# Patient Record
Sex: Male | Born: 1995 | Race: White | Hispanic: No | Marital: Single | Smoking: Current every day smoker
Health system: Southern US, Community
[De-identification: ages and names within clinical notes are randomized; demographics above are authoritative.]

## PROBLEM LIST (undated history)

## (undated) DIAGNOSIS — L700 Acne vulgaris: Secondary | ICD-10-CM

## (undated) DIAGNOSIS — B1920 Unspecified viral hepatitis C without hepatic coma: Secondary | ICD-10-CM

## (undated) HISTORY — PX: ORIF FINGER / THUMB FRACTURE: SUR932

## (undated) HISTORY — DX: Unspecified viral hepatitis C without hepatic coma: B19.20

---
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
## 2013-01-15 ENCOUNTER — Emergency Department (HOSPITAL_COMMUNITY): Payer: Medicaid Other

## 2013-01-15 ENCOUNTER — Emergency Department (HOSPITAL_COMMUNITY)
Admission: EM | Admit: 2013-01-15 | Discharge: 2013-01-15 | Disposition: A | Discharge status: Home / Self Care | Payer: Medicaid Other | Attending: Emergency Medicine | Admitting: Emergency Medicine

## 2013-01-15 ENCOUNTER — Encounter (HOSPITAL_COMMUNITY): Payer: Self-pay | Admitting: Emergency Medicine

## 2013-01-15 DIAGNOSIS — R1013 Epigastric pain: Secondary | ICD-10-CM | POA: Insufficient documentation

## 2013-01-15 DIAGNOSIS — Z79899 Other long term (current) drug therapy: Secondary | ICD-10-CM | POA: Insufficient documentation

## 2013-01-15 DIAGNOSIS — F17200 Nicotine dependence, unspecified, uncomplicated: Secondary | ICD-10-CM | POA: Insufficient documentation

## 2013-01-15 DIAGNOSIS — R109 Unspecified abdominal pain: Secondary | ICD-10-CM

## 2013-01-15 LAB — COMPREHENSIVE METABOLIC PANEL
ALT: 19 U/L (ref 0–53)
AST: 26 U/L (ref 0–37)
Alkaline Phosphatase: 76 U/L (ref 39–117)
GFR calc Af Amer: >90 mL/min (ref >90)
Glucose, Bld: 111 mg/dL — ABNORMAL HIGH (ref 70–99)
Potassium: 3.7 mEq/L (ref 3.5–5.1)
Sodium: 137 mEq/L (ref 135–145)
Total Protein: 7.6 g/dL (ref 6.0–8.3)

## 2013-01-15 LAB — CBC WITH DIFFERENTIAL/PLATELET
Basophils Absolute: 0 10*3/uL (ref 0.0–0.1)
Eosinophils Absolute: 0 10*3/uL (ref 0.0–0.7)
Lymphocytes Relative: 23 % (ref 12–46)
Lymphs Abs: 1.9 10*3/uL (ref 0.7–4.0)
MCH: 30.9 pg (ref 26.0–34.0)
Neutrophils Relative %: 68 % (ref 43–77)
Platelets: 210 10*3/uL (ref 150–400)
RBC: 4.98 MIL/uL (ref 4.22–5.81)
RDW: 12.6 % (ref 11.5–15.5)
WBC: 8.4 10*3/uL (ref 4.0–10.5)

## 2013-01-15 LAB — URINALYSIS, ROUTINE W REFLEX MICROSCOPIC
Bilirubin Urine: NEGATIVE
Glucose, UA: NEGATIVE mg/dL
Hgb urine dipstick: NEGATIVE
Protein, ur: NEGATIVE mg/dL
Urobilinogen, UA: 0.2 mg/dL (ref 0.0–1.0)

## 2013-01-15 MED ORDER — IOHEXOL 300 MG/ML  SOLN
50.0000 mL | Freq: Once | INTRAMUSCULAR | Status: AC | PRN
  Administered 2013-01-15: 50 mL via ORAL

## 2013-01-15 MED ORDER — IOHEXOL 300 MG/ML  SOLN: 100.0000 mL | Freq: Once | INTRAMUSCULAR | Status: DC | PRN

## 2013-01-15 MED ORDER — HYDROMORPHONE HCL PF 1 MG/ML IJ SOLN
1.0000 mg | Freq: Once | INTRAMUSCULAR | Status: AC
  Administered 2013-01-15: 1 mg via INTRAVENOUS
  Filled 2013-01-15: qty 1

## 2013-01-15 MED ORDER — OXYCODONE-ACETAMINOPHEN 5-325 MG PO TABS: 1.0000 | ORAL_TABLET | Freq: Four times a day (QID) | ORAL | Status: DC | PRN

## 2013-01-15 MED ORDER — HYDROMORPHONE HCL PF 1 MG/ML IJ SOLN
INTRAMUSCULAR | Status: AC
  Administered 2013-01-15: 1 mg
  Filled 2013-01-15: qty 1

## 2013-01-15 MED ORDER — PANTOPRAZOLE SODIUM 40 MG IV SOLR
40.0000 mg | Freq: Once | INTRAVENOUS | Status: AC
Start: 2013-01-15 — End: 2013-01-15
  Administered 2013-01-15: 40 mg via INTRAVENOUS
  Filled 2013-01-15: qty 40

## 2013-01-15 MED ORDER — PANTOPRAZOLE SODIUM 20 MG PO TBEC: 20.0000 mg | DELAYED_RELEASE_TABLET | Freq: Every day | ORAL | Status: DC

## 2013-01-15 MED ORDER — ONDANSETRON HCL 4 MG/2ML IJ SOLN
4.0000 mg | Freq: Once | INTRAMUSCULAR | Status: AC
  Administered 2013-01-15: 4 mg via INTRAVENOUS
  Filled 2013-01-15: qty 2

## 2013-01-15 NOTE — ED Notes (Signed)
Pt c/o abd pain x one hour. Pt denies n/v/d.

## 2013-01-15 NOTE — ED Provider Notes (Signed)
History     CSN: [id number]  Arrival date & time 01/15/13  7829   First MD Initiated Contact with Patient 01/15/13 1907      Chief Complaint  Patient presents with  . Abdominal Pain    (Consider location/radiation/quality/duration/timing/severity/associated sxs/prior treatment) Patient is a 17 y.o. male presenting with abdominal pain. The history is provided by the patient (the pt complains of ruq and epigastric severe abd pain).  Abdominal Pain Pain location:  Epigastric Pain quality: sharp   Pain radiates to:  Does not radiate Pain severity:  Severe Onset quality:  Sudden Timing:  Constant Progression:  Waxing and waning Chronicity:  New Context: not alcohol use   Associated symptoms: no chest pain, no cough, no diarrhea, no fatigue and no hematuria     History reviewed. No pertinent past medical history.  History reviewed. No pertinent past surgical history.  History reviewed. No pertinent family history.  History  Substance Use Topics  . Smoking status: Current Every Day Smoker    Types: Cigarettes  . Smokeless tobacco: Not on file  . Alcohol Use: No      Review of Systems  Constitutional: Negative for fatigue.  HENT: Negative for congestion, sinus pressure and ear discharge.   Eyes: Negative for discharge.  Respiratory: Negative for cough.   Cardiovascular: Negative for chest pain.  Gastrointestinal: Positive for abdominal pain. Negative for diarrhea.  Genitourinary: Negative for frequency and hematuria.  Musculoskeletal: Negative for back pain.  Skin: Negative for rash.  Neurological: Negative for seizures and headaches.  Psychiatric/Behavioral: Negative for hallucinations.    Allergies  Review of patient's allergies indicates no known allergies.  Home Medications   Current Outpatient Rx  Name  Route  Sig  Dispense  Refill  . Biotin 1000 MCG tablet   Oral   Take 1,000 mcg by mouth daily.         Chilton Si Coffee Bean-Yerba Mate (GREEN COFFEE  BEAN EXTRACT PO)   Oral   Take 2 tablets by mouth 2 (two) times daily.         Marland Kitchen L-Arginine 1000 MG TABS   Oral   Take 1 tablet by mouth 3 (three) times daily.         . L-Glutamine 500 MG TABS   Oral   Take 1,000 mg by mouth 3 (three) times daily.         . Multiple Vitamin (MULTIVITAMIN WITH MINERALS) TABS   Oral   Take 1 tablet by mouth daily.         Marland Kitchen sulfamethoxazole-trimethoprim (BACTRIM DS) 800-160 MG per tablet   Oral   Take 1 tablet by mouth 2 (two) times daily.         Marland Kitchen oxyCODONE-acetaminophen (PERCOCET/ROXICET) 5-325 MG per tablet   Oral   Take 1 tablet by mouth every 6 (six) hours as needed for pain.   20 tablet   0   . pantoprazole (PROTONIX) 20 MG tablet   Oral   Take 1 tablet (20 mg total) by mouth daily.   20 tablet   0     BP 112/45  Pulse 56  Temp(Src) 98 F (36.7 C) (Oral)  Resp 20  Ht 6\' 3"  (1.905 m)  Wt 195 lb (88.451 kg)  BMI 24.37 kg/m2  SpO2 98%  Physical Exam  Constitutional: He is oriented to person, place, and time. He appears well-developed.  HENT:  Head: Normocephalic and atraumatic.  Eyes: Conjunctivae and EOM are normal. No scleral icterus.  Neck: Neck supple. No thyromegaly present.  Cardiovascular: Normal rate and regular rhythm.  Exam reveals no gallop and no friction rub.   No murmur heard. Pulmonary/Chest: No stridor. He has no wheezes. He has no rales. He exhibits no tenderness.  Abdominal: He exhibits no distension. There is tenderness. There is no rebound.  Tender epigastric and luq no rebound  Musculoskeletal: Normal range of motion. He exhibits no edema.  Lymphadenopathy:    He has no cervical adenopathy.  Neurological: He is oriented to person, place, and time. Coordination normal.  Skin: No rash noted. No erythema.  Psychiatric: He has a normal mood and affect. His behavior is normal.    ED Course  Procedures (including critical care time)  Labs Reviewed  CBC WITH DIFFERENTIAL - Abnormal; Notable  for the following:    MCHC 36.2 (*)    All other components within normal limits  COMPREHENSIVE METABOLIC PANEL - Abnormal; Notable for the following:    Glucose, Bld 111 (*)    GFR calc non Af Amer 85 (*)    All other components within normal limits  URINALYSIS, ROUTINE W REFLEX MICROSCOPIC - Abnormal; Notable for the following:    Specific Gravity, Urine >1.030 (*)    Ketones, ur TRACE (*)    All other components within normal limits  LIPASE, BLOOD   Ct Abdomen Pelvis W Contrast  01/15/2013  *RADIOLOGY REPORT*  Clinical Data: Right upper abdominal pain.  CT ABDOMEN AND PELVIS WITH CONTRAST  Technique:  Multidetector CT imaging of the abdomen and pelvis was performed following the standard protocol during bolus administration of intravenous contrast.  Contrast: 50mL OMNIPAQUE IOHEXOL 300 MG/ML  SOLN  Comparison: None.  Findings: Visualized lung bases clear.  Nonspecific 1 cm low attenuation lesion in hepatic segment 7. Unremarkable spleen, adrenal glands, pancreas, kidneys, nondilated gallbladder, abdominal aorta, portal vein.  Stomach is physiologically distended.  The small bowel and colon are nondilated.  Appendix not discretely identified.  Urinary bladder physiologically distended. No ascites.  No free air.  No nephrolithiasis or hydronephrosis. Lumbar spine unremarkable.  IMPRESSION:  1.  No acute abdominal process. 2.  Nonspecific 1 cm liver lesion, statistically most likely a cyst in the absence of a history of primary carcinoma peri   Original Report Authenticated By: D. Andria Rhein, MD      1. Abdominal pain       MDM  abd pain possibly pud.  Pt to follow up in two day        Benny Lennert, MD 01/15/13 2202

## 2014-08-04 ENCOUNTER — Encounter (HOSPITAL_COMMUNITY): Payer: Self-pay | Admitting: Emergency Medicine

## 2014-08-04 ENCOUNTER — Emergency Department (HOSPITAL_COMMUNITY)
Admission: EM | Admit: 2014-08-04 | Discharge: 2014-08-04 | Disposition: A | Discharge status: Other Acute Inpatient Hospital | Payer: Self-pay | Attending: Emergency Medicine | Admitting: Emergency Medicine

## 2014-08-04 ENCOUNTER — Inpatient Hospital Stay (HOSPITAL_COMMUNITY)
Admission: AD | Admit: 2014-08-04 | Discharge: 2014-08-07 | DRG: 885 | Disposition: A | Discharge status: Home / Self Care | Payer: 59 | Source: Intra-hospital | Attending: Psychiatry | Admitting: Psychiatry

## 2014-08-04 ENCOUNTER — Encounter (HOSPITAL_COMMUNITY): Payer: Self-pay | Admitting: Behavioral Health

## 2014-08-04 DIAGNOSIS — Z609 Problem related to social environment, unspecified: Secondary | ICD-10-CM

## 2014-08-04 DIAGNOSIS — F909 Attention-deficit hyperactivity disorder, unspecified type: Secondary | ICD-10-CM | POA: Diagnosis present

## 2014-08-04 DIAGNOSIS — F411 Generalized anxiety disorder: Secondary | ICD-10-CM | POA: Diagnosis present

## 2014-08-04 DIAGNOSIS — F322 Major depressive disorder, single episode, severe without psychotic features: Secondary | ICD-10-CM

## 2014-08-04 DIAGNOSIS — F39 Unspecified mood [affective] disorder: Secondary | ICD-10-CM | POA: Diagnosis present

## 2014-08-04 DIAGNOSIS — F1910 Other psychoactive substance abuse, uncomplicated: Secondary | ICD-10-CM

## 2014-08-04 DIAGNOSIS — R45851 Suicidal ideations: Secondary | ICD-10-CM

## 2014-08-04 DIAGNOSIS — F17200 Nicotine dependence, unspecified, uncomplicated: Secondary | ICD-10-CM | POA: Diagnosis present

## 2014-08-04 DIAGNOSIS — G4700 Insomnia, unspecified: Secondary | ICD-10-CM | POA: Diagnosis present

## 2014-08-04 DIAGNOSIS — F332 Major depressive disorder, recurrent severe without psychotic features: Secondary | ICD-10-CM | POA: Diagnosis present

## 2014-08-04 HISTORY — DX: Acne vulgaris: L70.0

## 2014-08-04 LAB — COMPREHENSIVE METABOLIC PANEL
ALBUMIN: 3.7 g/dL (ref 3.5–5.2)
ALT: 38 U/L (ref 0–53)
AST: 43 U/L — AB (ref 0–37)
Alkaline Phosphatase: 74 U/L (ref 39–117)
Anion gap: 9 (ref 5–15)
BUN: 16 mg/dL (ref 6–23)
CALCIUM: 8.9 mg/dL (ref 8.4–10.5)
CO2: 27 meq/L (ref 19–32)
Chloride: 102 mEq/L (ref 96–112)
Creatinine, Ser: 0.94 mg/dL (ref 0.50–1.35)
GFR calc Af Amer: >90 mL/min (ref >90)
Glucose, Bld: 77 mg/dL (ref 70–99)
Potassium: 3.8 mEq/L (ref 3.7–5.3)
SODIUM: 138 meq/L (ref 137–147)
TOTAL PROTEIN: 6.8 g/dL (ref 6.0–8.3)
Total Bilirubin: <0.2 mg/dL — ABNORMAL LOW (ref 0.3–1.2)

## 2014-08-04 LAB — RAPID URINE DRUG SCREEN, HOSP PERFORMED
AMPHETAMINES: NOT DETECTED
BENZODIAZEPINES: NOT DETECTED
Barbiturates: NOT DETECTED
COCAINE: NOT DETECTED
OPIATES: NOT DETECTED
Tetrahydrocannabinol: NOT DETECTED

## 2014-08-04 LAB — ETHANOL: Alcohol, Ethyl (B): <11 mg/dL (ref 0–11)

## 2014-08-04 LAB — CBC
HCT: 39.4 % (ref 39.0–52.0)
Hemoglobin: 13.3 g/dL (ref 13.0–17.0)
MCH: 30.2 pg (ref 26.0–34.0)
MCHC: 33.8 g/dL (ref 30.0–36.0)
MCV: 89.5 fL (ref 78.0–100.0)
PLATELETS: 223 10*3/uL (ref 150–400)
RBC: 4.4 MIL/uL (ref 4.22–5.81)
RDW: 12.5 % (ref 11.5–15.5)
WBC: 8.2 10*3/uL (ref 4.0–10.5)

## 2014-08-04 LAB — ACETAMINOPHEN LEVEL: Acetaminophen (Tylenol), Serum: <15 ug/mL (ref 10–30)

## 2014-08-04 LAB — SALICYLATE LEVEL

## 2014-08-04 MED ORDER — ZOLPIDEM TARTRATE 5 MG PO TABS: 5.0000 mg | ORAL_TABLET | Freq: Every evening | ORAL | Status: DC | PRN

## 2014-08-04 MED ORDER — IBUPROFEN 200 MG PO TABS: 600.0000 mg | ORAL_TABLET | Freq: Three times a day (TID) | ORAL | Status: DC | PRN

## 2014-08-04 MED ORDER — ONDANSETRON HCL 4 MG PO TABS: 4.0000 mg | ORAL_TABLET | Freq: Three times a day (TID) | ORAL | Status: DC | PRN

## 2014-08-04 MED ORDER — LORAZEPAM 1 MG PO TABS: 1.0000 mg | ORAL_TABLET | Freq: Three times a day (TID) | ORAL | Status: DC | PRN

## 2014-08-04 MED ORDER — HYDROXYZINE HCL 25 MG PO TABS
25.0000 mg | ORAL_TABLET | ORAL | Status: DC | PRN
  Filled 2014-08-04: qty 42

## 2014-08-04 MED ORDER — NICOTINE 21 MG/24HR TD PT24
21.0000 mg | MEDICATED_PATCH | Freq: Every day | TRANSDERMAL | Status: DC
  Administered 2014-08-04 – 2014-08-07 (×4): 21 mg via TRANSDERMAL
  Filled 2014-08-04 (×6): qty 1

## 2014-08-04 MED ORDER — MAGNESIUM HYDROXIDE 400 MG/5ML PO SUSP: 30.0000 mL | Freq: Every day | ORAL | Status: DC | PRN

## 2014-08-04 MED ORDER — NICOTINE 21 MG/24HR TD PT24: 21.0000 mg | MEDICATED_PATCH | Freq: Every day | TRANSDERMAL | Status: DC | PRN

## 2014-08-04 MED ORDER — TRAZODONE HCL 100 MG PO TABS
50.0000 mg | ORAL_TABLET | Freq: Every day | ORAL | Status: DC
  Administered 2014-08-05 – 2014-08-06 (×2): 50 mg via ORAL
  Filled 2014-08-04 (×6): qty 1

## 2014-08-04 MED ORDER — ALUM & MAG HYDROXIDE-SIMETH 200-200-20 MG/5ML PO SUSP: 30.0000 mL | ORAL | Status: DC | PRN

## 2014-08-04 MED ORDER — ACETAMINOPHEN 325 MG PO TABS: 650.0000 mg | ORAL_TABLET | ORAL | Status: DC | PRN

## 2014-08-04 NOTE — ED Notes (Signed)
Per patients mother, he just got off the bus from [place], he's been there with his girlfriend doing drugs and now he states that he's suicidal. Pt doesn't want to be here but mom has convinced him that [facility] is a great place to be.

## 2014-08-04 NOTE — ED Notes (Signed)
Bed: [facility] Expected date:  Expected time:  Means of arrival:  Comments: 

## 2014-08-04 NOTE — ED Notes (Addendum)
Pt has wallet, cellphone, clothing, and shoes.   Pt has been seen by security and wanded.

## 2014-08-04 NOTE — ED Provider Notes (Signed)
CSN: [id number]     Arrival date & time 08/04/14  0508 History   First MD Initiated Contact with Patient 08/04/14 [phone]     Chief Complaint  Patient presents with  . Suicidal     (Consider location/radiation/quality/duration/timing/severity/associated sxs/prior Treatment) HPI Comments: 58M presents with suicidal ideation. Hx of suicide attempt by cutting wrists. Recently moved to [place] with girlfriend, was smoking pot daily and drinking a few shots every other day. 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 Became more suicidal, patient had plan to slit his wrists and walk into the ocean. Mother convinced him to come home to [place] and to come to behavioral health. Still suicidal now.    Patient is a 18 y.o. male presenting with mental health disorder. The history is provided by the patient.  Mental Health Problem Presenting symptoms: depression, suicidal thoughts and suicidal threats   Presenting symptoms: no bizarre behavior, no disorganized speech, no disorganized thought process, no paranoid behavior, no self mutilation and no suicide attempt   Degree of incapacity (severity):  Moderate Onset quality:  Gradual Timing:  Constant Progression:  Worsening Chronicity:  Recurrent Context: drug abuse, noncompliance and stressful life event (broke up with girlfriend)   Context: not alcohol use, not medication and not recent medication change   Treatment compliance:  Untreated Relieved by:  Nothing Worsened by:  Nothing tried Associated symptoms: feelings of worthlessness   Associated symptoms: no abdominal pain, no anhedonia, no chest pain, no decreased need for sleep, no hypersomnia, no hyperventilation, no poor judgment, no psychomotor retardation and no school problems     History reviewed. No pertinent past medical history. History reviewed. No pertinent past surgical history. History reviewed. No pertinent family history. History  Substance Use Topics  . Smoking status:  Current Every Day Smoker    Types: Cigarettes  . Smokeless tobacco: Not on file  . Alcohol Use: No    Review of Systems  Constitutional: Negative for fever and chills.  Cardiovascular: Negative for chest pain.  Gastrointestinal: Negative for nausea, vomiting, abdominal pain and diarrhea.  Psychiatric/Behavioral: Positive for suicidal ideas. Negative for self-injury and paranoia.  All other systems reviewed and are negative.     Allergies  Review of patient's allergies indicates no known allergies.  Home Medications   Prior to Admission medications   Medication Sig Start Date End Date Taking? Authorizing Provider  sulfamethoxazole-trimethoprim (BACTRIM DS) 800-160 MG per tablet Take 1 tablet by mouth 2 (two) times daily.   Yes Historical Provider, MD   BP 130/87  Pulse 50  Temp(Src) 97.5 F (36.4 C) (Oral)  Resp 20  SpO2 100% Physical Exam  Nursing note and vitals reviewed. Constitutional: He is oriented to person, place, and time. He appears well-developed and well-nourished. No distress.  HENT:  Head: Normocephalic and atraumatic.  Mouth/Throat: Oropharynx is clear and moist. No oropharyngeal exudate.  Eyes: EOM are normal. Pupils are equal, round, and reactive to light.  Neck: Normal range of motion. Neck supple.  Cardiovascular: Normal rate and regular rhythm.  Exam reveals no friction rub.   No murmur heard. Pulmonary/Chest: Effort normal and breath sounds normal. No respiratory distress. He has no wheezes. He has no rales.  Abdominal: Soft. He exhibits no distension. There is no tenderness. There is no rebound.  Musculoskeletal: Normal range of motion. He exhibits no edema.  Neurological: He is alert and oriented to person, place, and time.  Skin: No rash noted. He is not diaphoretic.    ED Course  Procedures (including critical care time) Labs Review Labs Reviewed  COMPREHENSIVE METABOLIC PANEL - Abnormal; Notable for the following:    AST 43 (*)    Total  Bilirubin <0.2 (*)    All other components within normal limits  SALICYLATE LEVEL - Abnormal; Notable for the following:    Salicylate Lvl <2.0 (*)    All other components within normal limits  ACETAMINOPHEN LEVEL  CBC  ETHANOL  URINE RAPID DRUG SCREEN (HOSP PERFORMED)    Imaging Review No results found.   EKG Interpretation None      MDM   Final diagnoses:  MDD (major depressive disorder), single episode, severe , no psychosis  Suicidal ideation    18M with recent stay in [place] using drugs and development of suicidality. Plan to slit his wrists and walk into the ocean while in [place]. Patient states continued plan to take a bunch of his mother's Klonopin and never wake up. Medically clear per labs, ready for TTS consult.  Psych admitting.    Elwin Mocha, MD 08/04/14 [phone]

## 2014-08-04 NOTE — Tx Team (Signed)
Initial Interdisciplinary Treatment Plan   PATIENT STRESSORS: Financial difficulties Loss of Girl Friend Marital or family conflict Occupational concerns Substance abuse   PROBLEM LIST: Problem List/Patient Goals Date to be addressed Date deferred Reason deferred Estimated date of resolution  Depression  08/04/2014   08/10/2014  Suicidal thoughts 08/04/2014   08/10/2014  Loss of Relationship 08/04/2014   08/10/2014  Substance Abuse 08/04/2014   08/10/2014                                 DISCHARGE CRITERIA:  Ability to meet basic life and health needs Improved stabilization in mood, thinking, and/or behavior Medical problems require only outpatient monitoring Motivation to continue treatment in a less acute level of care Need for constant or close observation no longer present Reduction of life-threatening or endangering symptoms to within safe limits Verbal commitment to aftercare and medication compliance  PRELIMINARY DISCHARGE PLAN: Attend 12-step recovery group Outpatient therapy Return to previous living arrangement  PATIENT/FAMIILY INVOLVEMENT: This treatment plan has been presented to and reviewed with the patient, John [name], and/or family member.  The patient and family have been given the opportunity to ask questions and make suggestions.  John [name] John [name] 08/04/2014, 3:51 PM

## 2014-08-04 NOTE — ED Notes (Signed)
Pt admits to be suicidal and depressed about his [place] trip not going well, he said he and his girlfriend ended up homeless and was doing drugs.

## 2014-08-04 NOTE — ED Notes (Addendum)
MD at bedside. TTS PRESENT TO SEE PT

## 2014-08-04 NOTE — ED Notes (Signed)
Patient has 2 patient belonging bags, and on long grey bag filled with clothing. Patients 3 bags are in locker 27 in [place].
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
## 2014-08-04 NOTE — Consult Note (Signed)
[facility] Consult   Reason for Consult:  Suicidal ideation Referring Physician:  EDP  John [name] is an 18 y.o. male. Total Time spent with patient: 30 minutes  Assessment: AXIS I:  Major Depression, Recurrent severe and Substance Abuse AXIS II:  Deferred AXIS III:  History reviewed. No pertinent past medical history. AXIS IV:  other psychosocial or environmental problems AXIS V:  11-20 some danger of hurting self or others possible OR occasionally fails to maintain minimal personal hygiene OR gross impairment in communication  Plan:  Recommend psychiatric Inpatient admission when medically cleared.  Subjective:    HPI:  John [name] is a 18 y.o. male patient.  Patient states that he is having worsening depression and suicidal thoughts wit plan to overdose.  Patient states that he does have a history of suicide attempt 4 years ago cutting his wrist.  Patient states that he has had one psych hospitalization also 4 years ago.  Patient lives with his mother.  Stressor for depression recently having to move back home from [place] when things didn't work out with girlfriend and states that he was homeless in [place] for couple weeks until got enough money to get back home.  Patient is unable to contract for safety. Patient denies homicidal ideation, psychosis, and paranoia.  Patient does state that he use recreation drugs "Weed, alcohol and pop pills."   HPI Elements:   Location:  Worsening depression. Quality:  suicidal ideation. Severity:  suicidal ideation. Timing:  1 week. Review of Systems  HENT: Negative.   Respiratory: Negative.   Musculoskeletal: Negative.   Skin: Negative for itching.       Facial acne   Psychiatric/Behavioral: Positive for depression, suicidal ideas and substance abuse. Negative for hallucinations and memory loss. The patient is not nervous/anxious and does not have insomnia.   All other systems reviewed and are negative. History reviewed.  No pertinent family history.   Past Psychiatric History: History reviewed. No pertinent past medical history.  reports that he has been smoking Cigarettes.  He has been smoking about 0.00 packs per day. He does not have any smokeless tobacco history on file. He reports that he does not drink alcohol or use illicit drugs. History reviewed. No pertinent family history.         Allergies:  No Known Allergies  ACT Assessment Complete:  Yes:    Educational Status    Risk to Self: Risk to self with the past 6 months Is patient at risk for suicide?: Yes Substance abuse history and/or treatment for substance abuse?: Yes  Risk to Others:    Abuse:    Prior Inpatient Therapy:    Prior Outpatient Therapy:    Additional Information:         Objective: Blood pressure 114/55, pulse 42, temperature 98.4 F (36.9 C), temperature source Oral, resp. rate 12, SpO2 99.00%.There is no weight on file to calculate BMI. Results for orders placed during the hospital encounter of 08/04/14 (from the past 72 hour(s))  URINE RAPID DRUG SCREEN (HOSP PERFORMED)     Status: None   Collection Time    08/04/14  5:38 AM      Result Value Ref Range   Opiates NONE DETECTED  NONE DETECTED   Cocaine NONE DETECTED  NONE DETECTED   Benzodiazepines NONE DETECTED  NONE DETECTED   Amphetamines NONE DETECTED  NONE DETECTED   Tetrahydrocannabinol NONE DETECTED  NONE DETECTED   Barbiturates NONE DETECTED  NONE DETECTED   Comment:  DRUG SCREEN FOR MEDICAL PURPOSES     ONLY.  IF CONFIRMATION IS NEEDED     FOR ANY PURPOSE, NOTIFY LAB     WITHIN 5 DAYS.                LOWEST DETECTABLE LIMITS     FOR URINE DRUG SCREEN     Drug Class       Cutoff (ng/mL)     Amphetamine      1000     Barbiturate      200     Benzodiazepine   165     Tricyclics       790     Opiates          300     Cocaine          300     THC              50  ACETAMINOPHEN LEVEL     Status: None   Collection Time    08/04/14  5:39  AM      Result Value Ref Range   Acetaminophen (Tylenol), Serum <15.0  10 - 30 ug/mL   Comment:            THERAPEUTIC CONCENTRATIONS VARY     SIGNIFICANTLY. A RANGE OF 10-30     ug/mL MAY BE AN EFFECTIVE     CONCENTRATION FOR MANY PATIENTS.     HOWEVER, SOME ARE BEST TREATED     AT CONCENTRATIONS OUTSIDE THIS     RANGE.     ACETAMINOPHEN CONCENTRATIONS     >150 ug/mL AT 4 HOURS AFTER     INGESTION AND >50 ug/mL AT 12     HOURS AFTER INGESTION ARE     OFTEN ASSOCIATED WITH TOXIC     REACTIONS.  CBC     Status: None   Collection Time    08/04/14  5:39 AM      Result Value Ref Range   WBC 8.2  4.0 - 10.5 K/uL   RBC 4.40  4.22 - 5.81 MIL/uL   Hemoglobin 13.3  13.0 - 17.0 g/dL   HCT 39.4  39.0 - 52.0 %   MCV 89.5  78.0 - 100.0 fL   MCH 30.2  26.0 - 34.0 pg   MCHC 33.8  30.0 - 36.0 g/dL   RDW 12.5  11.5 - 15.5 %   Platelets 223  150 - 400 K/uL  COMPREHENSIVE METABOLIC PANEL     Status: Abnormal   Collection Time    08/04/14  5:39 AM      Result Value Ref Range   Sodium 138  137 - 147 mEq/L   Potassium 3.8  3.7 - 5.3 mEq/L   Chloride 102  96 - 112 mEq/L   CO2 27  19 - 32 mEq/L   Glucose, Bld 77  70 - 99 mg/dL   BUN 16  6 - 23 mg/dL   Creatinine, Ser 0.94  0.50 - 1.35 mg/dL   Calcium 8.9  8.4 - 10.5 mg/dL   Total Protein 6.8  6.0 - 8.3 g/dL   Albumin 3.7  3.5 - 5.2 g/dL   AST 43 (*) 0 - 37 U/L   ALT 38  0 - 53 U/L   Alkaline Phosphatase 74  39 - 117 U/L   Total Bilirubin <0.2 (*) 0.3 - 1.2 mg/dL   GFR calc non Af Amer >90  >90 mL/min   GFR calc  Af Amer >90  >90 mL/min   Comment: (NOTE)     The eGFR has been calculated using the CKD EPI equation.     This calculation has not been validated in all clinical situations.     eGFR's persistently <90 mL/min signify possible Chronic Kidney     Disease.   Anion gap 9  5 - 15  ETHANOL     Status: None   Collection Time    08/04/14  5:39 AM      Result Value Ref Range   Alcohol, Ethyl (B) <11  0 - 11 mg/dL   Comment:             LOWEST DETECTABLE LIMIT FOR     SERUM ALCOHOL IS 11 mg/dL     FOR MEDICAL PURPOSES ONLY  SALICYLATE LEVEL     Status: Abnormal   Collection Time    08/04/14  5:39 AM      Result Value Ref Range   Salicylate Lvl <5.4 (*) 2.8 - 20.0 mg/dL   Labs are reviewed UDS positive opiates and THC.  Medications reviewed and no chang  Current Facility-Administered Medications  Medication Dose Route Frequency Provider Last Rate Last Dose  . acetaminophen (TYLENOL) tablet 650 mg  650 mg Oral Q4H PRN Evelina Bucy, MD      . alum & mag hydroxide-simeth (MAALOX/MYLANTA) 200-200-20 MG/5ML suspension 30 mL  30 mL Oral PRN Evelina Bucy, MD      . ibuprofen (ADVIL,MOTRIN) tablet 600 mg  600 mg Oral Q8H PRN Evelina Bucy, MD      . LORazepam (ATIVAN) tablet 1 mg  1 mg Oral Q8H PRN Evelina Bucy, MD      . nicotine (NICODERM CQ - dosed in mg/24 hours) patch 21 mg  21 mg Transdermal Daily PRN Evelina Bucy, MD      . ondansetron [facility]) tablet 4 mg  4 mg Oral Q8H PRN Evelina Bucy, MD      . zolpidem (AMBIEN) tablet 5 mg  5 mg Oral QHS PRN Evelina Bucy, MD       Current Outpatient Prescriptions  Medication Sig Dispense Refill  . sulfamethoxazole-trimethoprim (BACTRIM DS) 800-160 MG per tablet Take 1 tablet by mouth 2 (two) times daily.        Psychiatric Specialty Exam:     Blood pressure 114/55, pulse 42, temperature 98.4 F (36.9 C), temperature source Oral, resp. rate 12, SpO2 99.00%.There is no weight on file to calculate BMI.  General Appearance: Casual  Eye Contact::  Fair  Speech:  Clear and Coherent and Normal Rate  Volume:  Normal  Mood:  Depressed  Affect:  Depressed and Flat  Thought Process:  Circumstantial and Goal Directed  Orientation:  Full (Time, Place, and Person)  Thought Content:  "I''m suicidal"  Suicidal Thoughts:  Yes.  with intent/plan  Homicidal Thoughts:  No  Memory:  Immediate;   Good Recent;   Good Remote;   Good  Judgement:  Impaired  Insight:  Lacking  Psychomotor  Activity:  Decreased  Concentration:  Fair  Recall:  Good  Fund of Knowledge:Good  Language: Good  Akathisia:  No  Handed:  Right  AIMS (if indicated):     Assets:  Communication Skills Desire for Improvement Housing Social Support  Sleep:      Musculoskeletal: Strength & Muscle Tone: within normal limits Gait & Station: normal Patient leans: N/A  Treatment Plan Summary: Daily contact with patient to assess and evaluate symptoms and progress  in treatment Medication management Recommend inpatient treatment  Earleen Newport, FNP-BC 08/04/2014 12:10 PM

## 2014-08-04 NOTE — Consult Note (Signed)
Face to face evaluation and I agree with this note 

## 2014-08-04 NOTE — BH Assessment (Signed)
Patient accepted to to [facility] by Dr. Rodman Key and Assunta Found, NP. The room assignment # is 307-2. Nursing report # is [phone]. Support paperwork completed.

## 2014-08-05 DIAGNOSIS — F411 Generalized anxiety disorder: Secondary | ICD-10-CM

## 2014-08-05 DIAGNOSIS — R45851 Suicidal ideations: Secondary | ICD-10-CM

## 2014-08-05 DIAGNOSIS — F39 Unspecified mood [affective] disorder: Secondary | ICD-10-CM

## 2014-08-05 DIAGNOSIS — F1910 Other psychoactive substance abuse, uncomplicated: Secondary | ICD-10-CM | POA: Diagnosis present

## 2014-08-05 MED ORDER — LORAZEPAM 1 MG PO TABS
1.0000 mg | ORAL_TABLET | Freq: Four times a day (QID) | ORAL | Status: DC | PRN
  Administered 2014-08-05 – 2014-08-07 (×5): 1 mg via ORAL
  Filled 2014-08-05 (×5): qty 1

## 2014-08-05 MED ORDER — LAMOTRIGINE 25 MG PO TABS
25.0000 mg | ORAL_TABLET | Freq: Every day | ORAL | Status: DC
  Administered 2014-08-05 – 2014-08-07 (×3): 25 mg via ORAL
  Filled 2014-08-05 (×5): qty 1
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
## 2014-08-05 NOTE — [facility] Group Notes (Signed)
[facility] LCSW Group Therapy 08/05/2014  1:15 PM Type of Therapy: Group Therapy Participation Level: Active  Participation Quality: Attentive, Sharing and Supportive  Affect: Depressed and Flat  Cognitive: Alert and Oriented  Insight: Developing/Improving and Engaged  Engagement in Therapy: Developing/Improving and Engaged  Modes of Intervention: Clarification, Confrontation, Discussion, Education, Exploration, Limit-setting, Orientation, Problem-solving, Rapport Building, Dance movement psychotherapist, Socialization and Support  Summary of Progress/Problems: The topic for group today was emotional regulation. This group focused on both positive and negative emotion identification and allowed group members to process ways to identify feelings, regulate negative emotions, and find healthy ways to manage internal/external emotions. Group members were asked to reflect on a time when their reaction to an emotion led to a negative outcome and explored how alternative responses using emotion regulation would have benefited them. Group members were also asked to discuss a time when emotion regulation was utilized when a negative emotion was experienced. Patient reported that he did not wish to share on topic this afternoon. Patient was observed sleeping during part of the group but did provide support to another patient and discussed the negative effects of reacting before thinking when dealing with anger. CSW's and other group members provided emotional support and encouragement.   [name], MSW, [employer] Clinical Social Worker [facility] [phone]
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
## 2014-08-05 NOTE — Progress Notes (Signed)
Patient ID: John [name], male   DOB: 1996-03-13, 18 y.o.   MRN: [id number] D: Patient reported feeling anxious after a reported phone call with mother. Pt stated his mother is going through a lot but will not elaborate. Pt mood and affect appeared anxious and depressed. Pt denies SI/HI/AVH.  No acute distressed noted at this time.   A: Met with pt 1:1. Medications administered as prescribed. Writer encouraged pt to discuss feelings. Pt encouraged to come to staff with any question or concerns.   R: Patient remains safe. He is complaint with medications and denies any adverse reaction. Continue current POC.

## 2014-08-05 NOTE — Clinical Social Work Note (Signed)
CSW provided patient with medicaid application.  [name], MSW, [employer] Clinical Social Worker [facility] [phone]

## 2014-08-05 NOTE — Progress Notes (Signed)
Pt has been up and active in the milieu today.  He rated all his depression hopelessness and anxiety a 7 on his self-inventory.  He was started on lamictal 25 mg today and was given a prn ativan at 1251 for his anxiety which he reported relief.  He denied any S/H ideation or A/V/H. He plans to try to stay positive and hopes he learn some coping skills while here.

## 2014-08-05 NOTE — [facility] Group Notes (Signed)
   [facility] LCSW Aftercare Discharge Planning Group Note  08/05/2014  8:45 AM   Participation Quality: Alert, Appropriate and Oriented  Mood/Affect: Depressed and Flat  Depression Rating: 7  Anxiety Rating: 7  Thoughts of Suicide: Pt denies SI/HI  Will you contract for safety? Yes  Current AVH: Pt denies  Plan for Discharge/Comments: Pt attended discharge planning group and actively participated in group. CSW provided pt with today's workbook. Patient reports feeling "alright, tired" today. He reports that he plans to discharge home with his parents and is agreeable to follow up with [facility] for outpatient services.   Transportation Means: Pt reports access to transportation via family  Supports: Patient identified his parents and his grandmother as supports.  [name], MSW, [employer] Clinical Social Worker [facility] [phone]

## 2014-08-05 NOTE — Progress Notes (Signed)
D  Pt was in bed asleep since coming on to the unit    He did not attend group and did not get his sleeping medication   He does not appear to be in distress and his respirations are normal A   Verbal support given   Medications administered and effectiveness monitored    Q 15 min checks R   Pt safe at present
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
## 2014-08-05 NOTE — Progress Notes (Signed)
Adult Psychoeducational Group Note  Date:  08/05/2014 Time:  10:00 am  Group Topic/Focus:  Wellness Toolbox:   The focus of this group is to discuss various aspects of wellness, balancing those aspects and exploring ways to increase the ability to experience wellness.  Patients will create a wellness toolbox for use upon discharge.  Participation Level:  Did Not Attend   Modena Nunnery 08/05/2014, 12:55 PM

## 2014-08-06 ENCOUNTER — Ambulatory Visit (HOSPITAL_COMMUNITY)
Admit: 2014-08-06 | Discharge: 2014-08-06 | Disposition: A | Discharge status: Home / Self Care | Payer: 59 | Attending: Psychiatry | Admitting: Psychiatry

## 2014-08-06 DIAGNOSIS — F909 Attention-deficit hyperactivity disorder, unspecified type: Secondary | ICD-10-CM

## 2014-08-06 MED ORDER — NICOTINE POLACRILEX 2 MG MT GUM
CHEWING_GUM | OROMUCOSAL | Status: AC
  Administered 2014-08-06: 2 mg
  Filled 2014-08-06: qty 1

## 2014-08-06 NOTE — Clinical Social Work Note (Signed)
CSW attempted to contact patient's mother John [name] [phone]. No answer. CSW will attempt to provide SPE at a later time.  [name], MSW, [employer] Clinical Social Worker [facility] [phone]
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
## 2014-08-06 NOTE — Progress Notes (Signed)
Pt was in bed most of the morning and did get up for breakfast and 0800 medications and back to bed.  He rated his depression a 3 hopelessness a 2 and his anxiety a 4 on his self-inventory.  He denies any S/H ideation or A/V/H.  He did request prn ativan at 1123 before lunch.  His goal "creating a better discharge plan" by "talking with the social worker and family".  He did agitated talking on the phone with his grandmother.  Pt stated,"she has already rented my room out to someone who is not even a family member until May next year"  He did talk about the possibility of staying with his Aunt who by chance was here representing [employer] this morning.

## 2014-08-06 NOTE — Clinical Social Work Note (Signed)
Patient requesting information about medicaid and short-term disability. CSW contacted financial counselor Geri at [facility] regarding patient's questions.  [name], MSW, [employer] Clinical Social Worker [facility] [phone]

## 2014-08-06 NOTE — Progress Notes (Signed)
Patient did attend the evening karaoke group. Pt was engaged, supportive, and participated by singing a song.  

## 2014-08-06 NOTE — Progress Notes (Addendum)
Transportation arranged for patient to go for xrays as ordered.  Patient refused to go and states we don"t need to worry about it.  [place] notified and cancelled.  Dr. Dub Mikes was also notified that patient refused the xrays.

## 2014-08-06 NOTE — [facility] Group Notes (Signed)
[facility] LCSW Group Therapy 08/06/2014 1:15 PM Type of Therapy: Group Therapy Participation Level: Active  Participation Quality: Attentive, Sharing and Supportive  Affect: Depressed and Flat  Cognitive: Alert and Oriented  Insight: Developing/Improving and Engaged  Engagement in Therapy: Developing/Improving and Engaged  Modes of Intervention: Activity, Clarification, Confrontation, Discussion, Education, Exploration, Limit-setting, Orientation, Problem-solving, Rapport Building, Dance movement psychotherapist, Socialization and Support  Summary of Progress/Problems: Patient was attentive and engaged with speaker from [employer]. Patient was attentive to speaker while they shared their story of dealing with mental health and overcoming it. Patient expressed interest in their programs and services and received information on their agency. Patient processed ways they can relate to the speaker. Patient left during group and returned at the very end.  [name], MSW, [facility] Clinical Social Worker [facility] [phone]

## 2014-08-06 NOTE — [facility] Group Notes (Signed)
0900 nursing orientation group   The focus of this group is to educate the patient on the purpose and policies of crisis stabilization and provide a format to answer questions about their admission.  The group details unit policies and expectations of patients while admitted.   Pt did not attend he was in bed asleep.  

## 2014-08-07 DIAGNOSIS — F332 Major depressive disorder, recurrent severe without psychotic features: Principal | ICD-10-CM

## 2014-08-07 MED ORDER — LORAZEPAM 1 MG PO TABS: 1.0000 mg | ORAL_TABLET | Freq: Four times a day (QID) | ORAL | Status: DC | PRN

## 2014-08-07 MED ORDER — SULFAMETHOXAZOLE-TMP DS 800-160 MG PO TABS: 1.0000 | ORAL_TABLET | Freq: Two times a day (BID) | ORAL | Status: DC

## 2014-08-07 MED ORDER — TRAZODONE HCL 50 MG PO TABS: 50.0000 mg | ORAL_TABLET | Freq: Every day | ORAL | Status: DC

## 2014-08-07 MED ORDER — LAMOTRIGINE 25 MG PO TABS: 25.0000 mg | ORAL_TABLET | Freq: Every day | ORAL | Status: DC

## 2014-08-07 MED ORDER — SULFAMETHOXAZOLE-TMP DS 800-160 MG PO TABS
1.0000 | ORAL_TABLET | Freq: Two times a day (BID) | ORAL | Status: DC
  Filled 2014-08-07 (×2): qty 28

## 2014-08-07 MED ORDER — HYDROXYZINE HCL 25 MG PO TABS: ORAL_TABLET | ORAL | Status: DC

## 2014-08-07 NOTE — Clinical Social Work Note (Signed)
Per pt request, CSW faxed letter to [facility] of Court [phone]) indicating hospitalization dates and requesting excusal from court today[place] [place] [place] Pt told CSW this needed to be done at 12:30PM on day of court. Court letter faxed with pt permission (verbal and signed). Fax confirmation and original letter provided to pt.  [employer], LCSWA  08/07/2014 1:33 PM

## 2014-08-07 NOTE — Progress Notes (Signed)
Patient ID: John [name], male   DOB: November 07, 1996, 18 y.o.   MRN: [id number] D: Patient reported dealing with issues outside this hospital. Pt stated he want to discharge and seek outpatient treatment. Pt mood and affect appeared anxious and depressed. Pt denies SI/HI/AVH.  No acute distressed noted at this time.   A: Met with pt 1:1. Medications administered as prescribed. Writer encouraged pt to discuss feelings. Pt encouraged to come to staff with any question or concerns.   R: Patient remains safe and attended evening karaoke. He is complaint with medications and denies any adverse reaction. Continue current POC.

## 2014-08-07 NOTE — Progress Notes (Signed)
Adult Psychoeducational Group Note  Date:  08/07/2014 Time:  11:32 AM  Group Topic/Focus:  Relapse Prevention Planning:   The focus of this group is to define relapse and discuss the need for planning to combat relapse.  Participation Level:  Did Not Attend  Dalia Heading 08/07/2014, 11:32 AM

## 2014-08-07 NOTE — [facility] Group Notes (Signed)
[facility] LCSW Group Therapy  Feelings Around Relapse 1:15 -2:30        08/07/2014   Type of Therapy:  Group Therapy  Participation Level:  Appropriate  Participation Quality:  Appropriate  Affect:  Appropriate  Cognitive:  Attentive Appropriate  Insight:  Developing/Improving  Engagement in Therapy: Developing/Improving  Modes of Intervention:  Discussion Exploration Problem-Solving Supportive  Summary of Progress/Problems:  The topic for today was feelings around relapse.    Patient processed feelings toward relapse and was able to relate to peers.  He shared he would be investing his time in unhealthy relationships. Patient shared he has co-dependency issues and tries to make a relationship work that is no good for him. Patient identified coping skills that can be used to prevent a relapse including going to [facility].   Wynn Banker 08/07/2014

## 2014-08-07 NOTE — Progress Notes (Signed)
[facility] Adult Case Management Discharge Plan :  Will you be returning to the same living situation after discharge: Yes,  home with mother At discharge, do you have transportation home?:Yes,  bus pass in chart Do you have the ability to pay for your medications:Yes,  mental health  Release of information consent forms completed and submitted to Medical Records by CSW.  Patient to Follow up at: Follow-up Information   Follow up with [facility]. (Please present Monday through Friday between 8 am to 3 pm for intake assessment for therapy and medication management services. )    Specialty:  [facility] information:   [address] [place] [address] [phone]       Patient denies SI/HI:   Yes,  during group/self report.     Safety Planning and Suicide Prevention discussed:  Yes,  Contact attempts made with pt's mother/vm left. SPE completed with pt and he was provided with SPI pamphlet, encouraged to share information with support network, ask questions, and talk about any concerns relating to SPE.  Smart, Tre Sanker LCSWA  08/07/2014, 1:40 PM
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
## 2014-08-07 NOTE — [facility] Suicide Risk Assessment (Signed)
[facility] INPATIENT:  Family/Significant Other Suicide Prevention Education  Suicide Prevention Education:  Contact Attempts: Josua Ferrebee (pt's mother) [phone] has been identified by the patient as the family member/significant other with whom the patient will be residing, and identified as the person(s) who will aid the patient in the event of a mental health crisis.  With written consent from the patient, two attempts were made to provide suicide prevention education, prior to and/or following the patient's discharge.  We were unsuccessful in providing suicide prevention education.  A suicide education pamphlet was given to the patient to share with family/significant other.  Date and time of first attempt: 08/06/14 2:00PM Date and time of second attempt: 08/07/14 1:35PM left message for pt's mother requesting call back.  Smart, Laraya Pestka LCSWA 08/07/2014, 1:36 PM
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
## 2014-08-07 NOTE — Progress Notes (Signed)
Patient discharged per physician order; patient denies SI/HI and A/V hallucinations; patient recieved samples, prescriptions, and copy of AVS after it was reviewed; patient had no other questions or concerns at this time; patient verbalized and signed that he received all belongings; patient left the unit ambulatory
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
## 2014-08-12 NOTE — Progress Notes (Signed)
Patient Discharge Instructions:  After Visit Summary (AVS):   Faxed to:  08/12/14 Discharge Summary Note:   Faxed to:  08/12/14 Psychiatric Admission Assessment Note:   Faxed to:  08/12/14 Suicide Risk Assessment - Discharge Assessment:   Faxed to:  08/12/14 Faxed/Sent to the Next Level Care provider:  08/12/14 [facility] @ [phone]  Jerelene ReddenSheena E Seldovia, 08/12/2014, 3:37 PM

## 2018-04-22 ENCOUNTER — Emergency Department (HOSPITAL_COMMUNITY)
Admission: EM | Admit: 2018-04-22 | Discharge: 2018-04-22 | Disposition: A | Discharge status: Other Acute Inpatient Hospital | Payer: Self-pay

## 2018-04-22 NOTE — ED Notes (Signed)
Pt called from the lobby with no response 

## 2018-04-23 ENCOUNTER — Emergency Department (HOSPITAL_COMMUNITY): Payer: Self-pay

## 2018-04-23 ENCOUNTER — Emergency Department (HOSPITAL_COMMUNITY)
Admission: EM | Admit: 2018-04-23 | Discharge: 2018-04-23 | Disposition: A | Discharge status: Home / Self Care | Payer: Self-pay | Attending: Emergency Medicine | Admitting: Emergency Medicine

## 2018-04-23 ENCOUNTER — Encounter (HOSPITAL_COMMUNITY): Payer: Self-pay

## 2018-04-23 DIAGNOSIS — F17210 Nicotine dependence, cigarettes, uncomplicated: Secondary | ICD-10-CM | POA: Insufficient documentation

## 2018-04-23 DIAGNOSIS — Z79899 Other long term (current) drug therapy: Secondary | ICD-10-CM | POA: Insufficient documentation

## 2018-04-23 DIAGNOSIS — J029 Acute pharyngitis, unspecified: Secondary | ICD-10-CM | POA: Insufficient documentation

## 2018-04-23 LAB — GROUP A STREP BY PCR: Group A Strep by PCR: NOT DETECTED

## 2018-04-23 MED ORDER — PREDNISONE 20 MG PO TABS: 40.0000 mg | ORAL_TABLET | Freq: Every day | ORAL | 0 refills | Status: DC

## 2018-04-23 NOTE — ED Triage Notes (Signed)
Pt complains of a sore throat and a cough for two days.
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
## 2018-07-26 ENCOUNTER — Emergency Department (HOSPITAL_COMMUNITY)
Admission: EM | Admit: 2018-07-26 | Discharge: 2018-07-27 | Disposition: A | Discharge status: Home / Self Care | Payer: Medicaid Other | Attending: Emergency Medicine | Admitting: Emergency Medicine

## 2018-07-26 DIAGNOSIS — Z5321 Procedure and treatment not carried out due to patient leaving prior to being seen by health care provider: Secondary | ICD-10-CM | POA: Insufficient documentation

## 2018-07-26 DIAGNOSIS — R05 Cough: Secondary | ICD-10-CM | POA: Insufficient documentation

## 2018-07-26 DIAGNOSIS — R51 Headache: Secondary | ICD-10-CM | POA: Insufficient documentation

## 2018-07-26 NOTE — ED Notes (Signed)
Pt left the ED to go get food

## 2018-07-27 ENCOUNTER — Encounter (HOSPITAL_COMMUNITY): Payer: Self-pay

## 2018-07-27 ENCOUNTER — Emergency Department (HOSPITAL_COMMUNITY)
Admission: EM | Admit: 2018-07-27 | Discharge: 2018-07-27 | Disposition: A | Discharge status: Home / Self Care | Payer: Medicaid Other | Attending: Emergency Medicine | Admitting: Emergency Medicine

## 2018-07-27 ENCOUNTER — Emergency Department (HOSPITAL_COMMUNITY): Payer: Medicaid Other

## 2018-07-27 ENCOUNTER — Other Ambulatory Visit: Payer: Self-pay

## 2018-07-27 DIAGNOSIS — Y939 Activity, unspecified: Secondary | ICD-10-CM | POA: Insufficient documentation

## 2018-07-27 DIAGNOSIS — S0083XA Contusion of other part of head, initial encounter: Secondary | ICD-10-CM

## 2018-07-27 DIAGNOSIS — F17210 Nicotine dependence, cigarettes, uncomplicated: Secondary | ICD-10-CM | POA: Insufficient documentation

## 2018-07-27 DIAGNOSIS — R059 Cough, unspecified: Secondary | ICD-10-CM

## 2018-07-27 DIAGNOSIS — R05 Cough: Secondary | ICD-10-CM | POA: Insufficient documentation

## 2018-07-27 DIAGNOSIS — Y999 Unspecified external cause status: Secondary | ICD-10-CM | POA: Insufficient documentation

## 2018-07-27 DIAGNOSIS — Y929 Unspecified place or not applicable: Secondary | ICD-10-CM | POA: Insufficient documentation

## 2018-07-27 DIAGNOSIS — S01412A Laceration without foreign body of left cheek and temporomandibular area, initial encounter: Secondary | ICD-10-CM | POA: Insufficient documentation

## 2018-07-27 DIAGNOSIS — S0181XA Laceration without foreign body of other part of head, initial encounter: Secondary | ICD-10-CM

## 2018-07-27 DIAGNOSIS — W2209XA Striking against other stationary object, initial encounter: Secondary | ICD-10-CM | POA: Insufficient documentation

## 2018-07-27 MED ORDER — ALBUTEROL SULFATE HFA 108 (90 BASE) MCG/ACT IN AERS: 1.0000 | INHALATION_SPRAY | Freq: Four times a day (QID) | RESPIRATORY_TRACT | 0 refills | Status: DC | PRN

## 2018-07-27 MED ORDER — IBUPROFEN 600 MG PO TABS: 600.0000 mg | ORAL_TABLET | Freq: Four times a day (QID) | ORAL | 0 refills | Status: DC | PRN

## 2018-07-27 MED ORDER — HYDROCOD POLST-CPM POLST ER 10-8 MG/5ML PO SUER
5.0000 mL | Freq: Once | ORAL | Status: AC
  Administered 2018-07-27: 5 mL via ORAL
  Filled 2018-07-27: qty 5

## 2018-07-27 MED ORDER — LIDOCAINE-EPINEPHRINE-TETRACAINE (LET) SOLUTION
3.0000 mL | Freq: Once | NASAL | Status: AC
  Administered 2018-07-27: 3 mL via TOPICAL
  Filled 2018-07-27: qty 3

## 2018-07-27 NOTE — ED Provider Notes (Signed)
[facility] Provider Note   CSN: [id number] Arrival date & time: 07/27/18  0033     History   Chief Complaint Chief Complaint  Patient presents with  . Laceration    L cheek    HPI John [name] is a 22 y.o. male.  Patient presents with facial injury that occurred last evening (07/26/18) around 7:00 pm. He was getting out of his car and accidentally ran into the car door causing pain, swelling and laceration to the left cheek. No eye injury or pain. No LOC, nausea. No neck pain. He also complains of a cough for the past several days that is intermittently productive. He reports chills without fever. No chest pain, nasal congestion, sore throat.   The history is provided by the patient. No language interpreter was used.  Laceration      Past Medical History:  Diagnosis Date  . Acne nodule    on going    Patient Active Problem List   Diagnosis Date Noted  . Unspecified episodic mood disorder 08/05/2014  . Polysubstance abuse (HCC) 08/05/2014  . MDD (major depressive disorder), single episode, severe , no psychosis (HCC) 08/04/2014  . Suicidal ideation 08/04/2014  . MDD (major depressive disorder), recurrent severe, without psychosis (HCC) 08/04/2014    History reviewed. No pertinent surgical history.      Home Medications    Prior to Admission medications   Medication Sig Start Date End Date Taking? Authorizing Provider  ibuprofen (ADVIL,MOTRIN) 200 MG tablet Take 400 mg by mouth every 6 (six) hours as needed for moderate pain.   Yes [provider]  hydrOXYzine (ATARAX/VISTARIL) 25 MG tablet Take 1 tablet (25 mg) three times daily as needed: For anxiety Patient not taking: Reported on 07/27/2018 08/07/14   Armandina Stammer I, NP  lamoTRIgine (LAMICTAL) 25 MG tablet Take 1 tablet (25 mg total) by mouth daily. For mood stabilization Patient not taking: Reported on 07/27/2018 08/07/14   Armandina Stammer I, NP  LORazepam (ATIVAN) 1 MG  tablet Take 1 tablet (1 mg total) by mouth every 6 (six) hours as needed for anxiety. Patient not taking: Reported on 07/27/2018 08/07/14   Armandina Stammer I, NP  predniSONE (DELTASONE) 20 MG tablet Take 2 tablets (40 mg total) by mouth daily. Patient not taking: Reported on 07/27/2018 04/23/18   Bethel Born, PA-C  sulfamethoxazole-trimethoprim (BACTRIM DS) 800-160 MG per tablet Take 1 tablet by mouth 2 (two) times daily. For facial acne Patient not taking: Reported on 07/27/2018 08/07/14   Armandina Stammer I, NP  traZODone (DESYREL) 50 MG tablet Take 1 tablet (50 mg total) by mouth at bedtime. For sleep Patient not taking: Reported on 07/27/2018 08/07/14   Sanjuana Kava, NP    Family History Family History  Problem Relation Age of Onset  . Bipolar disorder Mother     Social History Social History   Tobacco Use  . Smoking status: Current Every Day Smoker    Packs/day: 1.00    Years: 5.00    Pack years: 5.00    Types: Cigarettes  . Smokeless tobacco: Never Used  Substance Use Topics  . Alcohol use: Yes    Alcohol/week: 6.0 standard drinks    Types: 6 Shots of liquor per week    Comment: daily drinker  . Drug use: Yes    Types: Marijuana, Oxycodone     Allergies   Tessalon [benzonatate]   Review of Systems Review of Systems  Constitutional: Negative for chills and  fever.  HENT: Positive for facial swelling.   Eyes: Negative for pain.  Respiratory: Positive for cough.   Cardiovascular: Negative.  Negative for chest pain.  Gastrointestinal: Negative.  Negative for nausea and vomiting.  Musculoskeletal: Negative.  Negative for neck pain.  Skin: Positive for wound.  Neurological: Negative.  Negative for headaches.     Physical Exam Updated Vital Signs BP 108/73 (BP Location: Right Arm)   Pulse 84   Temp 98 F (36.7 C) (Oral)   Resp 18   SpO2 99%   Physical Exam  Constitutional: He is oriented to person, place, and time. He appears well-developed and well-nourished.    HENT:  Head: Normocephalic.  Left sided facial swelling and significant tenderness to zygomatic region, extending to left TMJ. No malocclusion or bony deformity. No hemotympanum.   Neck: Normal range of motion. Neck supple.  Cardiovascular: Normal rate and regular rhythm.  Pulmonary/Chest: Effort normal and breath sounds normal. He has no wheezes. He has no rales.  Actively coughing on respiration.  Abdominal: Soft. Bowel sounds are normal. There is no tenderness. There is no rebound and no guarding.  Musculoskeletal: Normal range of motion.  Neurological: He is alert and oriented to person, place, and time.  Skin: Skin is warm and dry. No rash noted.  1 cm linear laceration to anterior left cheek   Psychiatric: He has a normal mood and affect.  Nursing note and vitals reviewed.    ED Treatments / Results  Labs (all labs ordered are listed, but only abnormal results are displayed) Labs Reviewed - No data to display  EKG None  Radiology No results found. Dg Chest 2 View  Result Date: 07/27/2018 CLINICAL DATA:  Productive cough. EXAM: CHEST - 2 VIEW COMPARISON:  Chest radiograph April 23, 2018 FINDINGS: Cardiomediastinal silhouette is normal. No pleural effusions or focal consolidations. Trachea projects midline and there is no pneumothorax. Soft tissue planes and included osseous structures are non-suspicious. IMPRESSION: Normal chest. Electronically Signed   By: Awilda Metro M.D.   On: 07/27/2018 03:32   Ct Maxillofacial Wo Contrast  Result Date: 07/27/2018 CLINICAL DATA:  Facial trauma, patient reports striking left cheek with a car door. EXAM: CT MAXILLOFACIAL WITHOUT CONTRAST TECHNIQUE: Multidetector CT imaging of the maxillofacial structures was performed. Multiplanar CT image reconstructions were also generated. COMPARISON:  None. FINDINGS: Osseous: Zygomatic arches, nasal bone, and mandibles are intact. Leftward nasal septal deviation. Temporomandibular joints are  congruent. Orbits: Both orbits and globes are intact.  No orbital fracture. Sinuses: Clear.  No sinus fracture or fluid level. Soft tissues: Negative. Limited intracranial: No significant or unexpected finding. IMPRESSION: No facial bone fracture or acute abnormality. Electronically Signed   By: Narda Rutherford M.D.   On: 07/27/2018 04:01    Procedures .Marland KitchenLaceration Repair Date/Time: 07/27/2018 4:54 AM Performed by: Elpidio Anis, PA-C Authorized by: Elpidio Anis, PA-C   Consent:    Consent obtained:  Verbal   Consent given by:  Patient Anesthesia (see MAR for exact dosages):    Anesthesia method:  Topical application   Topical anesthetic:  LET Laceration details:    Location:  Face   Face location:  L cheek   Length (cm):  1 Repair type:    Repair type:  Simple Exploration:    Hemostasis achieved with:  Direct pressure   Contaminated: no   Treatment:    Area cleansed with:  Betadine and saline   Amount of cleaning:  Standard Skin repair:    Repair method:  Tissue adhesive Approximation:    Approximation:  Close Post-procedure details:    Dressing:  Open (no dressing)   Patient tolerance of procedure:  Tolerated well, no immediate complications   (including critical care time)  Medications Ordered in ED Medications  lidocaine-EPINEPHrine-tetracaine (LET) solution (has no administration in time range)     Initial Impression / Assessment and Plan / ED Course  I have reviewed the triage vital signs and the nursing notes.  Pertinent labs & imaging results that were available during my care of the patient were reviewed by me and considered in my medical decision making (see chart for details).     Patient presents with facial injury after running into his car door causing swelling and laceration of left cheek. He also complains of cough without fever or other URI symptoms. Patient is a smoker. No history of asthma.  Imaging is negative for bony injury. CXR clear of  infection. VSS. He continuously coughs throughout exam.   Feel he would benefit from Albuterol inhaler to help with cough. Discussed quitting smoking, however, patient is not interested in this. Recommended over-the-counter medications for symptomatic relief. He is asking for something for cough specifically. Allergic to Tessalon.   Encouraged used of the inhaler for cough. If more is needed, recommend Robitussin and smoking cessation. Will give single dose Tussionex in the ED prior to discharge.   Final Clinical Impressions(s) / ED Diagnoses   Final diagnoses:  None   1. Facial contusion 2. Facial laceration 3. Cough 4. Tobacco abuse  ED Discharge Orders    None       Elpidio AnisUpstill, Nihira Puello, Cordelia Poche-C 07/27/18 0459    Palumbo, April, MD 07/27/18 [phone]

## 2018-07-27 NOTE — ED Triage Notes (Signed)
Pt reports that he hit his L cheek with a car door. Small, shallow laceration noted. A&Ox4.

## 2018-11-23 ENCOUNTER — Ambulatory Visit (HOSPITAL_COMMUNITY)
Admission: RE | Admit: 2018-11-23 | Discharge: 2018-11-23 | Disposition: A | Discharge status: Home / Self Care | Payer: Self-pay | Attending: Psychiatry | Admitting: Psychiatry

## 2018-11-23 ENCOUNTER — Emergency Department (HOSPITAL_COMMUNITY)
Admission: EM | Admit: 2018-11-23 | Discharge: 2018-11-23 | Disposition: A | Discharge status: Other Acute Inpatient Hospital | Payer: Medicaid Other | Attending: Emergency Medicine | Admitting: Emergency Medicine

## 2018-11-23 ENCOUNTER — Other Ambulatory Visit: Payer: Self-pay

## 2018-11-23 ENCOUNTER — Encounter (HOSPITAL_COMMUNITY): Payer: Self-pay

## 2018-11-23 ENCOUNTER — Encounter (HOSPITAL_COMMUNITY): Payer: Self-pay | Admitting: Emergency Medicine

## 2018-11-23 ENCOUNTER — Inpatient Hospital Stay (HOSPITAL_COMMUNITY)
Admission: AD | Admit: 2018-11-23 | Discharge: 2018-11-25 | DRG: 885 | Disposition: A | Discharge status: Home / Self Care | Payer: Federal, State, Local not specified - Other | Source: Intra-hospital | Attending: Psychiatry | Admitting: Psychiatry

## 2018-11-23 DIAGNOSIS — R45851 Suicidal ideations: Secondary | ICD-10-CM | POA: Diagnosis present

## 2018-11-23 DIAGNOSIS — Z888 Allergy status to other drugs, medicaments and biological substances status: Secondary | ICD-10-CM | POA: Insufficient documentation

## 2018-11-23 DIAGNOSIS — Z9114 Patient's other noncompliance with medication regimen: Secondary | ICD-10-CM | POA: Diagnosis not present

## 2018-11-23 DIAGNOSIS — F322 Major depressive disorder, single episode, severe without psychotic features: Secondary | ICD-10-CM | POA: Diagnosis present

## 2018-11-23 DIAGNOSIS — G4700 Insomnia, unspecified: Secondary | ICD-10-CM | POA: Diagnosis present

## 2018-11-23 DIAGNOSIS — Z62811 Personal history of psychological abuse in childhood: Secondary | ICD-10-CM | POA: Diagnosis present

## 2018-11-23 DIAGNOSIS — F332 Major depressive disorder, recurrent severe without psychotic features: Secondary | ICD-10-CM | POA: Insufficient documentation

## 2018-11-23 DIAGNOSIS — Z818 Family history of other mental and behavioral disorders: Secondary | ICD-10-CM

## 2018-11-23 DIAGNOSIS — Z79899 Other long term (current) drug therapy: Secondary | ICD-10-CM | POA: Diagnosis not present

## 2018-11-23 DIAGNOSIS — F1020 Alcohol dependence, uncomplicated: Secondary | ICD-10-CM | POA: Insufficient documentation

## 2018-11-23 DIAGNOSIS — F1910 Other psychoactive substance abuse, uncomplicated: Secondary | ICD-10-CM | POA: Diagnosis present

## 2018-11-23 DIAGNOSIS — F1410 Cocaine abuse, uncomplicated: Secondary | ICD-10-CM | POA: Diagnosis present

## 2018-11-23 DIAGNOSIS — J45909 Unspecified asthma, uncomplicated: Secondary | ICD-10-CM | POA: Diagnosis present

## 2018-11-23 DIAGNOSIS — Y900 Blood alcohol level of less than 20 mg/100 ml: Secondary | ICD-10-CM | POA: Diagnosis present

## 2018-11-23 DIAGNOSIS — F4310 Post-traumatic stress disorder, unspecified: Secondary | ICD-10-CM | POA: Clinically undetermined

## 2018-11-23 DIAGNOSIS — F1210 Cannabis abuse, uncomplicated: Secondary | ICD-10-CM | POA: Diagnosis present

## 2018-11-23 DIAGNOSIS — F1010 Alcohol abuse, uncomplicated: Secondary | ICD-10-CM | POA: Diagnosis present

## 2018-11-23 DIAGNOSIS — F411 Generalized anxiety disorder: Secondary | ICD-10-CM | POA: Diagnosis present

## 2018-11-23 DIAGNOSIS — F17210 Nicotine dependence, cigarettes, uncomplicated: Secondary | ICD-10-CM | POA: Diagnosis present

## 2018-11-23 DIAGNOSIS — Z62810 Personal history of physical and sexual abuse in childhood: Secondary | ICD-10-CM | POA: Diagnosis present

## 2018-11-23 DIAGNOSIS — Z765 Malingerer [conscious simulation]: Secondary | ICD-10-CM | POA: Diagnosis not present

## 2018-11-23 DIAGNOSIS — F1220 Cannabis dependence, uncomplicated: Secondary | ICD-10-CM | POA: Insufficient documentation

## 2018-11-23 DIAGNOSIS — F1420 Cocaine dependence, uncomplicated: Secondary | ICD-10-CM | POA: Insufficient documentation

## 2018-11-23 DIAGNOSIS — F1994 Other psychoactive substance use, unspecified with psychoactive substance-induced mood disorder: Secondary | ICD-10-CM | POA: Insufficient documentation

## 2018-11-23 DIAGNOSIS — F410 Panic disorder [episodic paroxysmal anxiety] without agoraphobia: Secondary | ICD-10-CM | POA: Diagnosis present

## 2018-11-23 DIAGNOSIS — F603 Borderline personality disorder: Secondary | ICD-10-CM | POA: Diagnosis present

## 2018-11-23 LAB — COMPREHENSIVE METABOLIC PANEL
ALT: 24 U/L (ref 0–44)
AST: 19 U/L (ref 15–41)
Albumin: 3.9 g/dL (ref 3.5–5.0)
Alkaline Phosphatase: 54 U/L (ref 38–126)
Anion gap: 6 (ref 5–15)
BUN: 18 mg/dL (ref 6–20)
CALCIUM: 9 mg/dL (ref 8.9–10.3)
CHLORIDE: 107 mmol/L (ref 98–111)
CO2: 29 mmol/L (ref 22–32)
CREATININE: 0.93 mg/dL (ref 0.61–1.24)
GFR calc Af Amer: >60 mL/min (ref >60)
Glucose, Bld: 93 mg/dL (ref 70–99)
Potassium: 3.6 mmol/L (ref 3.5–5.1)
Sodium: 142 mmol/L (ref 135–145)
Total Bilirubin: 0.2 mg/dL — ABNORMAL LOW (ref 0.3–1.2)
Total Protein: 6.8 g/dL (ref 6.5–8.1)

## 2018-11-23 LAB — RAPID URINE DRUG SCREEN, HOSP PERFORMED
AMPHETAMINES: NOT DETECTED
Barbiturates: NOT DETECTED
Benzodiazepines: NOT DETECTED
Cocaine: POSITIVE — AB
OPIATES: NOT DETECTED
Tetrahydrocannabinol: POSITIVE — AB

## 2018-11-23 LAB — CBC
HCT: 41.9 % (ref 39.0–52.0)
Hemoglobin: 14.1 g/dL (ref 13.0–17.0)
MCH: 30.1 pg (ref 26.0–34.0)
MCHC: 33.7 g/dL (ref 30.0–36.0)
MCV: 89.3 fL (ref 80.0–100.0)
PLATELETS: 299 10*3/uL (ref 150–400)
RBC: 4.69 MIL/uL (ref 4.22–5.81)
RDW: 12.2 % (ref 11.5–15.5)
WBC: 9.2 10*3/uL (ref 4.0–10.5)
nRBC: 0 % (ref 0.0–0.2)

## 2018-11-23 LAB — ETHANOL: Alcohol, Ethyl (B): <10 mg/dL (ref <10)

## 2018-11-23 MED ORDER — OLANZAPINE 2.5 MG PO TABS
2.5000 mg | ORAL_TABLET | Freq: Every day | ORAL | Status: DC
  Administered 2018-11-23: 2.5 mg via ORAL
  Filled 2018-11-23 (×5): qty 1

## 2018-11-23 MED ORDER — ADULT MULTIVITAMIN W/MINERALS CH
1.0000 | ORAL_TABLET | Freq: Every day | ORAL | Status: DC
  Administered 2018-11-23 – 2018-11-25 (×3): 1 via ORAL
  Filled 2018-11-23 (×6): qty 1

## 2018-11-23 MED ORDER — NICOTINE POLACRILEX 2 MG MT GUM: 2.0000 mg | CHEWING_GUM | OROMUCOSAL | Status: DC | PRN

## 2018-11-23 MED ORDER — NICOTINE 21 MG/24HR TD PT24
21.0000 mg | MEDICATED_PATCH | Freq: Every day | TRANSDERMAL | Status: DC
  Administered 2018-11-23 – 2018-11-25 (×3): 21 mg via TRANSDERMAL
  Filled 2018-11-23 (×7): qty 1

## 2018-11-23 MED ORDER — LOPERAMIDE HCL 2 MG PO CAPS: 2.0000 mg | ORAL_CAPSULE | ORAL | Status: DC | PRN

## 2018-11-23 MED ORDER — SERTRALINE HCL 25 MG PO TABS
25.0000 mg | ORAL_TABLET | Freq: Every day | ORAL | Status: DC
  Filled 2018-11-23 (×2): qty 1

## 2018-11-23 MED ORDER — LORAZEPAM 1 MG PO TABS: 1.0000 mg | ORAL_TABLET | Freq: Four times a day (QID) | ORAL | Status: DC | PRN

## 2018-11-23 MED ORDER — LORAZEPAM 1 MG PO TABS: 1.0000 mg | ORAL_TABLET | Freq: Every day | ORAL | Status: DC

## 2018-11-23 MED ORDER — SERTRALINE HCL 20 MG/ML PO CONC: 25.0000 mg | Freq: Every day | ORAL | Status: DC

## 2018-11-23 MED ORDER — HYDROXYZINE HCL 25 MG PO TABS
25.0000 mg | ORAL_TABLET | Freq: Four times a day (QID) | ORAL | Status: DC | PRN
  Administered 2018-11-23 – 2018-11-24 (×2): 25 mg via ORAL
  Filled 2018-11-23 (×2): qty 1

## 2018-11-23 MED ORDER — MAGNESIUM HYDROXIDE 400 MG/5ML PO SUSP: 30.0000 mL | Freq: Every day | ORAL | Status: DC | PRN

## 2018-11-23 MED ORDER — TRAZODONE HCL 100 MG PO TABS
100.0000 mg | ORAL_TABLET | Freq: Once | ORAL | Status: DC
  Filled 2018-11-23: qty 1

## 2018-11-23 MED ORDER — ALUM & MAG HYDROXIDE-SIMETH 200-200-20 MG/5ML PO SUSP: 30.0000 mL | ORAL | Status: DC | PRN

## 2018-11-23 MED ORDER — ACETAMINOPHEN 325 MG PO TABS
650.0000 mg | ORAL_TABLET | Freq: Four times a day (QID) | ORAL | Status: DC | PRN
  Administered 2018-11-24: 650 mg via ORAL
  Filled 2018-11-23: qty 2

## 2018-11-23 MED ORDER — VITAMIN B-1 100 MG PO TABS
100.0000 mg | ORAL_TABLET | Freq: Every day | ORAL | Status: DC
  Administered 2018-11-24 – 2018-11-25 (×3): 100 mg via ORAL
  Filled 2018-11-23 (×5): qty 1

## 2018-11-23 MED ORDER — LORAZEPAM 1 MG PO TABS
1.0000 mg | ORAL_TABLET | Freq: Four times a day (QID) | ORAL | Status: AC
  Administered 2018-11-23 – 2018-11-24 (×4): 1 mg via ORAL
  Filled 2018-11-23 (×4): qty 1

## 2018-11-23 MED ORDER — LORAZEPAM 1 MG PO TABS
1.0000 mg | ORAL_TABLET | Freq: Three times a day (TID) | ORAL | Status: AC
  Administered 2018-11-24 – 2018-11-25 (×3): 1 mg via ORAL
  Filled 2018-11-23 (×3): qty 1

## 2018-11-23 MED ORDER — TRAZODONE HCL 50 MG PO TABS
50.0000 mg | ORAL_TABLET | Freq: Every evening | ORAL | Status: DC | PRN
  Administered 2018-11-23 – 2018-11-24 (×2): 50 mg via ORAL
  Filled 2018-11-23 (×2): qty 1

## 2018-11-23 MED ORDER — LORAZEPAM 1 MG PO TABS: 1.0000 mg | ORAL_TABLET | Freq: Two times a day (BID) | ORAL | Status: DC

## 2018-11-23 MED ORDER — ONDANSETRON 4 MG PO TBDP: 4.0000 mg | ORAL_TABLET | Freq: Four times a day (QID) | ORAL | Status: DC | PRN

## 2018-11-23 NOTE — Discharge Instructions (Signed)
Transfer patient back to behavioral health center.

## 2018-11-23 NOTE — [facility] Suicide Risk Assessment (Signed)
[facility] Admission Suicide Risk Assessment   Nursing information obtained from:  Patient Demographic factors:  Male, Adolescent or young adult, Caucasian, Low socioeconomic status, Unemployed Current Mental Status:  Suicidal ideation indicated by patient Loss Factors:  Decrease in vocational status, Decline in physical health, Financial problems / change in socioeconomic status Historical Factors:  Prior suicide attempts, Family history of mental illness or substance abuse, Impulsivity Risk Reduction Factors:  Sense of responsibility to family, Positive social support, Positive coping skills or problem solving skills  Total Time spent with patient: 45 minutes Principal Problem: Severe recurrent major depression without psychotic features (HCC) Diagnosis:  Principal Problem:   Severe recurrent major depression without psychotic features (HCC) Active Problems:   Polysubstance abuse (HCC)   Generalized anxiety disorder   PTSD (post-traumatic stress disorder)  Subjective Data:   Continued Clinical Symptoms:  Alcohol Use Disorder Identification Test Final Score (AUDIT): 21 The "Alcohol Use Disorders Identification Test", Guidelines for Use in Primary Care, Second Edition.  World Science writer [facility]). Score between 0-7:  no or low risk or alcohol related problems. Score between 8-15:  moderate risk of alcohol related problems. Score between 16-19:  high risk of alcohol related problems. Score 20 or above:  warrants further diagnostic evaluation for alcohol dependence and treatment.   CLINICAL FACTORS:  22, single , no children, currently unemployed, no legal issues,was living with grandparents, states he was recently kicked out due to drug abuse, after which he has been staying with friends.  Presented to the ED voluntarily requesting assistance for alcohol and drug abuse . Reports he has been more anxious and  depressed recently, and has had intermittent suicidal ideations, which he  describes as passive . States " its like I would rather be dead than having my whole family loathing me". (States he is not currently suicidal , feels better now that he is admitted and hopeful he will be able to work on his sobriety.) Endorses increased anxiety and  frequent panic attacks recently.  Reports neuro-vegetative symptoms of depression- poor sleep, poor appetite ( currently improving), poor energy level, anhedonia.   Of note, presents with pressured speech, subjective sense of " racing thoughts". Affect not irritable. States he has been drinking several times a week, usually 80 ounces a day, but sometimes more , resulting in recent blackouts. He reports he has been using Methamphetamine over the last several weeks ( IV) . States IV use is new, started 3-4 weeks ago, and that it represents a progression, worsening of his substance abuse . He has also been using Heroin, Cocaine but not regularly.  Last used Methamphetamine 1 week ago. Reports he last drank yesterday.  Admission BAL negative, UDS positive for Cocaine and Cannabis.  Reports prior history of mood disorder, anxiety, and states he has been diagnosed with " Manic Depressive",ADHD, Panic Disorder, PTSD, Borderline Personality Disorder, in the past . States he had improved , felt more stable on Adderall, Klonopin, Remeron in the past, but has not been on any psychiatric medication for several months, due to insurance constraints.. History of one episode of self cutting /suicidal attempt 3 years ago. We reviewed medication history- states he has tried several SSRIs in the past without good effect, tried Seroquel but felt excessively sedated, and that Abilify was not particularly helpful.  Denies medical illnesses. States he is allergic to [employer].   Family History- mother had history of Opiate Use Disorder, died from overdose 3 years ago.   Dx- Alcohol Use Disorder  ,  Methamphetamine Use Disorder , Alcohol Induced Mood  Disorder/Depressed , Alcohol Induced Anxiety Disorder . Consider Bipolar Disorder, Mixed .  Plan- Inpatient admission.  Ativan detox protocol to address potential alcohol WDL ( no current significant alcohol WDL symptoms- no tremors, no diaphoresis, vitals stable)  Patient requests Hep B, C, HIV testing due to recent onset IVDA Will discontinue Zoloft - we discussed mood stabilizer options. Agrees to Zyprexa. Will start at low dose to minimize sedation, titrate as tolerated .   Musculoskeletal: Strength & Muscle Tone: within normal limits Gait & Station: normal Patient leans: N/A  Psychiatric Specialty Exam: Physical Exam  ROS mild headache, no chest pain, no shortness of breath, no nausea, no vomiting , no diarrhea.   Blood pressure 120/64, pulse 67, temperature (!) 97.5 F (36.4 C), temperature source Oral, resp. rate 18, height 6\' 2"  (1.88 m), weight 78.5 kg, SpO2 100 %.Body mass index is 22.22 kg/m.  General Appearance: Fairly Groomed  Eye Contact:  Good  Speech:  Pressured  Volume:  Normal  Mood:  anxious, depressed but states " I am hopeful, I think I am going to get better"  Affect:  anxious  Thought Process:  Linear and Descriptions of Associations: Intact  Orientation:  Other:  fully alert and attentive, oriented x 3   Thought Content:  no hallucinations, no delusions, not internally preoccupied   Suicidal Thoughts:  No  Denies any suicidal or self injurious ideations, contracts for safety on unit, denies homicidal or violent ideations  Homicidal Thoughts:  No  Memory:  recent and remote grossly intact   Judgement:  Fair  Insight:  Fair  Psychomotor Activity:  no psychomotor agitation at this time, minimal distal tremors, no diaphoresis  Concentration:  Concentration: Fair and Attention Span: Fair  Recall:  Good  Fund of Knowledge:  Good  Language:  Good  Akathisia:  Negative  Handed:  Right  AIMS (if indicated):     Assets:  Communication Skills Desire for  Improvement Resilience  ADL's:  Intact  Cognition:  WNL  Sleep:         COGNITIVE FEATURES THAT CONTRIBUTE TO RISK:  Closed-mindedness and Loss of executive function    SUICIDE RISK:   Moderate:  Frequent suicidal ideation with limited intensity, and duration, some specificity in terms of plans, no associated intent, good self-control, limited dysphoria/symptomatology, some risk factors present, and identifiable protective factors, including available and accessible social support.  PLAN OF CARE: Patient will be admitted to inpatient psychiatric unit for stabilization and safety. Will provide and encourage milieu participation. Provide medication management and maked adjustments as needed.  Will provide medication management to address potential alcohol WDL. Will follow daily.    I certify that inpatient services furnished can reasonably be expected to improve the patient's condition.   Craige Cotta, MD 11/23/2018, 4:25 PM

## 2018-11-23 NOTE — ED Notes (Signed)
Called [facility] and spoke with Luiz Blare stated that once an available RN has finished with [facility], RN will call [facility] and receive report from [place], [place].

## 2018-11-23 NOTE — ED Triage Notes (Signed)
Pt brought over from [facility] by [facility] for medical clearance and to go back there when cleared per [facility] tech.  Pt reports needing detox for his ETOH and drug use.  Pt admits to being suicidal without a plan.

## 2018-11-23 NOTE — ED Notes (Signed)
Bed: [facility] Expected date:  Expected time:  Means of arrival:  Comments: 

## 2018-11-23 NOTE — ED Notes (Signed)
[facility] has been called and will send transport ASAP.
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
## 2018-11-23 NOTE — Progress Notes (Signed)
[facility] Group Notes:  (Nursing/MHT/Case Management/Adjunct)  Date:  11/23/2018  Time: 2045  Type of Therapy:  wrap up group  Participation Level:  Active  Participation Quality:  Appropriate, Attentive, Sharing and Supportive  Affect:  Appropriate and Excited  Cognitive:  Appropriate  Insight:  Improving  Engagement in Group:  Engaged  Modes of Intervention:  Clarification, Education and Support  Summary of Progress/Problems: Pt shared that he is very happy to be here at [facility] even if he had to wait 12 hours to get here. Pt says this is where he needs to be. If pt could change any one thing in his life it would be the loss of his mother 3 years ago. Pt is grateful for himself, having the strength to get through hard times with little to no support.   Marcille Buffy 11/23/2018, 9:54 PM

## 2018-11-23 NOTE — Tx Team (Signed)
Initial Treatment Plan 11/23/2018 2:37 PM John [name] NWG:[id number]    PATIENT STRESSORS: Financial difficulties Health problems Medication change or noncompliance Occupational concerns Substance abuse   PATIENT STRENGTHS: Ability for insight Capable of independent living Communication skills General fund of knowledge Supportive family/friends Work skills   PATIENT IDENTIFIED PROBLEMS: depression  Suicidal ideation  Anxiety "get rid of my anxiety, I need a clear thought process"  Substance abuse - "break this substance abuse cycle"               DISCHARGE CRITERIA:  Ability to meet basic life and health needs Motivation to continue treatment in a less acute level of care Verbal commitment to aftercare and medication compliance  PRELIMINARY DISCHARGE PLAN: Attend 12-step recovery group Placement in alternative living arrangements  PATIENT/FAMILY INVOLVEMENT: This treatment plan has been presented to and reviewed with the patient, John [name] .The patient and family have been given the opportunity to ask questions and make suggestions.  Kennyth Lose, RN 11/23/2018, 2:37 PM
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
## 2018-11-23 NOTE — H&P (Signed)
Behavioral Health Medical Screening Exam  John [name] is an 23 y.o. male.  Total Time spent with patient: 20 minutes  Psychiatric Specialty Exam: Physical Exam  Constitutional: He is oriented to person, place, and time. He appears well-developed and well-nourished. No distress.  HENT:  Head: Normocephalic and atraumatic.  Right Ear: External ear normal.  Left Ear: External ear normal.  Eyes: Pupils are equal, round, and reactive to light. Right eye exhibits no discharge. Left eye exhibits no discharge.  Cardiovascular: Normal rate.  Respiratory: Effort normal. No respiratory distress.  Musculoskeletal: Normal range of motion.  Neurological: He is alert and oriented to person, place, and time.  Skin: Skin is warm and dry. He is not diaphoretic.  Psychiatric: His speech is normal. His mood appears anxious. He is not withdrawn and not actively hallucinating. Thought content is not paranoid and not delusional. Cognition and memory are normal. He expresses impulsivity and inappropriate judgment. He exhibits a depressed mood. He expresses suicidal ideation. He expresses no homicidal ideation. He expresses no suicidal plans.    Review of Systems  Constitutional: Negative for chills, fever and weight loss.  Psychiatric/Behavioral: Positive for depression, substance abuse and suicidal ideas. Negative for hallucinations and memory loss. The patient is nervous/anxious and has insomnia.   All other systems reviewed and are negative.   Blood pressure 128/66, pulse (!) 54, temperature 98.4 F (36.9 C), resp. rate 18, SpO2 100 %.There is no height or weight on file to calculate BMI.  General Appearance: Casual and Well Groomed  Eye Contact:  Fair  Speech:  Clear and Coherent and Normal Rate  Volume:  Normal  Mood:  Anxious, Depressed, Hopeless and Worthless  Affect:  Congruent and Depressed  Thought Process:  Coherent, Goal Directed and Descriptions of Associations: Intact  Orientation:  Full  (Time, Place, and Person)  Thought Content:  Logical and Hallucinations: None  Suicidal Thoughts:  Yes.  without intent/plan  Homicidal Thoughts:  No  Memory:  Immediate;   Good Recent;   Fair  Judgement:  Impaired  Insight:  Fair  Psychomotor Activity:  Restlessness  Concentration: Concentration: Fair and Attention Span: Fair  Recall:  Good  Fund of Knowledge:Good  Language: Good  Akathisia:  NA  Handed:  Right  AIMS (if indicated):     Assets:  Communication Skills Desire for Improvement Housing Intimacy Leisure Time Physical Health  Sleep:       Musculoskeletal: Strength & Muscle Tone: within normal limits Gait & Station: normal   Blood pressure 128/66, pulse (!) 54, temperature 98.4 F (36.9 C), resp. rate 18, SpO2 100 %.  Recommendations:  Based on my evaluation the patient does not appear to have an emergency medical condition.  Recommend psychiatric Inpatient admission when medically cleared.  Jackelyn Poling, NP 11/23/2018, 6:35 AM

## 2018-11-23 NOTE — ED Provider Notes (Signed)
[facility]-EMERGENCY DEPT Provider Note   CSN: [id number] Arrival date & time: 11/23/18  [id number]     History   Chief Complaint Chief Complaint  Patient presents with  . detox  . Suicidal    HPI John [name] is a 23 y.o. male.  Patient presents with feeling depressed, and problems with increased binging type of use of etoh/drugs in the past several months. Symptoms gradual onset, moderate, persistent, slowly worsening. Pt was seen this AM at [facility]al [facility] and sent over for medical clearance/labs. Pt denies any recent physical illness. No fever or uri symptoms. Normal appetite. No recent wt loss. No current medication use. Intermittent thoughts of suicide, but denies any attempt at self harm. Pt denies hx severe etoh withdrawal symptoms, denies hx tremor or shakes when stops drinking.   The history is provided by the patient.    Past Medical History:  Diagnosis Date  . Acne nodule    on going    Patient Active Problem List   Diagnosis Date Noted  . Unspecified episodic mood disorder 08/05/2014  . Polysubstance abuse (HCC) 08/05/2014  . MDD (major depressive disorder), single episode, severe , no psychosis (HCC) 08/04/2014  . Suicidal ideation 08/04/2014  . MDD (major depressive disorder), recurrent severe, without psychosis (HCC) 08/04/2014    History reviewed. No pertinent surgical history.      Home Medications    Prior to Admission medications   Medication Sig Start Date End Date Taking? Authorizing Provider  albuterol (PROVENTIL HFA;VENTOLIN HFA) 108 (90 Base) MCG/ACT inhaler Inhale 1-2 puffs into the lungs every 6 (six) hours as needed (cough). 07/27/18   Elpidio AnisUpstill, Shari, PA-C  hydrOXYzine (ATARAX/VISTARIL) 25 MG tablet Take 1 tablet (25 mg) three times daily as needed: For anxiety Patient not taking: Reported on 07/27/2018 08/07/14   Armandina StammerNwoko, Agnes I, NP  ibuprofen (ADVIL,MOTRIN) 600 MG tablet Take 1 tablet (600 mg total) by mouth every 6  (six) hours as needed. Patient not taking: Reported on 11/23/2018 07/27/18   Elpidio AnisUpstill, Shari, PA-C  lamoTRIgine (LAMICTAL) 25 MG tablet Take 1 tablet (25 mg total) by mouth daily. For mood stabilization Patient not taking: Reported on 07/27/2018 08/07/14   Armandina StammerNwoko, Agnes I, NP  LORazepam (ATIVAN) 1 MG tablet Take 1 tablet (1 mg total) by mouth every 6 (six) hours as needed for anxiety. Patient not taking: Reported on 07/27/2018 08/07/14   Armandina StammerNwoko, Agnes I, NP  predniSONE (DELTASONE) 20 MG tablet Take 2 tablets (40 mg total) by mouth daily. Patient not taking: Reported on 07/27/2018 04/23/18   Bethel BornGekas, Kelly Marie, PA-C  sulfamethoxazole-trimethoprim (BACTRIM DS) 800-160 MG per tablet Take 1 tablet by mouth 2 (two) times daily. For facial acne Patient not taking: Reported on 07/27/2018 08/07/14   Armandina StammerNwoko, Agnes I, NP  traZODone (DESYREL) 50 MG tablet Take 1 tablet (50 mg total) by mouth at bedtime. For sleep Patient not taking: Reported on 07/27/2018 08/07/14   Sanjuana KavaNwoko, Agnes I, NP    Family History Family History  Problem Relation Age of Onset  . Bipolar disorder Mother     Social History Social History   Tobacco Use  . Smoking status: Current Every Day Smoker    Packs/day: 1.00    Years: 5.00    Pack years: 5.00    Types: Cigarettes  . Smokeless tobacco: Never Used  Substance Use Topics  . Alcohol use: Yes    Alcohol/week: 6.0 standard drinks    Types: 6 Shots of liquor per week  Comment: daily drinker  . Drug use: Yes    Types: Marijuana, Oxycodone     Allergies   Tessalon [benzonatate]   Review of Systems Review of Systems  Constitutional: Negative for fever.  HENT: Negative for sore throat.   Eyes: Negative for redness.  Respiratory: Negative for shortness of breath.   Cardiovascular: Negative for chest pain.  Gastrointestinal: Negative for abdominal pain and vomiting.  Genitourinary: Negative for flank pain.  Musculoskeletal: Negative for neck pain.  Skin: Negative for rash.    Neurological: Negative for headaches.  Hematological: Does not bruise/bleed easily.  Psychiatric/Behavioral: Positive for suicidal ideas. Negative for confusion.     Physical Exam Updated Vital Signs BP 129/80 (BP Location: Left Arm)   Pulse 69   Temp 97.6 F (36.4 C) (Oral)   Resp 20   SpO2 99%   Physical Exam Vitals signs and nursing note reviewed.  Constitutional:      Appearance: Normal appearance. He is well-developed.  HENT:     Head: Atraumatic.     Nose: Nose normal.     Mouth/Throat:     Mouth: Mucous membranes are moist.     Pharynx: Oropharynx is clear.  Eyes:     General: No scleral icterus.    Conjunctiva/sclera: Conjunctivae normal.     Pupils: Pupils are equal, round, and reactive to light.  Neck:     Musculoskeletal: Normal range of motion and neck supple. No neck rigidity.     Trachea: No tracheal deviation.  Cardiovascular:     Rate and Rhythm: Normal rate and regular rhythm.     Pulses: Normal pulses.     Heart sounds: Normal heart sounds. No murmur. No friction rub. No gallop.   Pulmonary:     Effort: Pulmonary effort is normal. No accessory muscle usage or respiratory distress.     Breath sounds: Normal breath sounds.  Abdominal:     General: Bowel sounds are normal. There is no distension.     Palpations: Abdomen is soft.     Tenderness: There is no abdominal tenderness.  Genitourinary:    Comments: No cva tenderness. Musculoskeletal:        General: No swelling.  Skin:    General: Skin is warm and dry.     Findings: No rash.  Neurological:     Mental Status: He is alert.     Comments: Alert, speech clear. Steady gait. No tremor or shakes.   Psychiatric:     Comments: Depressed mood.       ED Treatments / Results  Labs (all labs ordered are listed, but only abnormal results are displayed) Results for orders placed or performed during the hospital encounter of 11/23/18  CBC  Result Value Ref Range   WBC 9.2 4.0 - 10.5 K/uL   RBC  4.69 4.22 - 5.81 MIL/uL   Hemoglobin 14.1 13.0 - 17.0 g/dL   HCT 04.5 99.7 - 74.1 %   MCV 89.3 80.0 - 100.0 fL   MCH 30.1 26.0 - 34.0 pg   MCHC 33.7 30.0 - 36.0 g/dL   RDW 42.3 95.3 - 20.2 %   Platelets 299 150 - 400 K/uL   nRBC 0.0 0.0 - 0.2 %  Ethanol  Result Value Ref Range   Alcohol, Ethyl (B) <10 <10 mg/dL  Rapid urine drug screen (hospital performed)  Result Value Ref Range   Opiates NONE DETECTED NONE DETECTED   Cocaine POSITIVE (A) NONE DETECTED   Benzodiazepines NONE DETECTED NONE DETECTED  Amphetamines NONE DETECTED NONE DETECTED   Tetrahydrocannabinol POSITIVE (A) NONE DETECTED   Barbiturates NONE DETECTED NONE DETECTED   EKG None  Radiology No results found.  Procedures Procedures (including critical care time)  Medications Ordered in ED Medications - No data to display   Initial Impression / Assessment and Plan / ED Course  I have reviewed the triage vital signs and the nursing notes.  Pertinent labs & imaging results that were available during my care of the patient were reviewed by me and considered in my medical decision making (see chart for details).  Labs sent.  Reviewed nursing notes and prior charts for additional history.  [facility] bed [facility] when deemed medically clear.   Labs reviewed - chem normal.  pts vitals normal, no distress.   [facility].      Final Clinical Impressions(s) / ED Diagnoses   Final diagnoses:  None    ED Discharge Orders    None       Cathren LaineSteinl, Dartanian Knaggs, MD 11/23/18 1117

## 2018-11-23 NOTE — ED Notes (Signed)
Called Adult [facility] and spoke with Carleene Mains, Charity fundraiser. RN that would be accepting report was busy and will call RN back at [phone] and ask to to speak with Lucrezia Europe RN to receive report.

## 2018-11-23 NOTE — ED Notes (Signed)
Report given to Sarah RN at Adult [facility] unit.

## 2018-11-24 LAB — LIPID PANEL
Cholesterol: 163 mg/dL (ref 0–200)
HDL: 58 mg/dL (ref >40)
LDL Cholesterol: 74 mg/dL (ref 0–99)
Total CHOL/HDL Ratio: 2.8 RATIO
Triglycerides: 156 mg/dL — ABNORMAL HIGH (ref <150)
VLDL: 31 mg/dL (ref 0–40)

## 2018-11-24 LAB — TSH: TSH: 1.399 u[IU]/mL (ref 0.350–4.500)

## 2018-11-24 LAB — HEMOGLOBIN A1C
Hgb A1c MFr Bld: 5.5 % (ref 4.8–5.6)
Mean Plasma Glucose: 111.15 mg/dL

## 2018-11-24 NOTE — Progress Notes (Signed)
Patient did not attend AA group. 

## 2018-11-24 NOTE — Progress Notes (Addendum)
[facility]  11/24/2018 12:55 PM John [name]  MRN:  [id number] Subjective:  From H&P note: John [name] is a 23 year old Caucasian male who [facility] seeking help for his drug and alcohol abuse and his depression and anxiety. [facility]. His UDS was positive for cocaine and THC. His BAL was negative. He stated he has been drinking daily. He reported that for the past 5 days or so he has not used any alcohol or drugs and currently is not feeling any withdrawal symptoms. He appears casually dressed and clean. He has pressured speech but is pleasant on approach. He endorses the following symptoms;  crying spells, tearfulness, social withdrawal, crippling anxiety and panic attacks, decreased appetite, insomnia, feelings of guilt, hopelessness, and complete loss of family support. He stated he has recently, within the past 2 months, started using IV drugs but not on a daily basis and this is what prompted him to seek help. He stated his mother was an IV drug user and died of a drug overdose in 2017.   Today during evaluation on the unit: Pt was found in his room, lying in bed after breakfast. Pt was extremely sleepy and stated he had some medication to help him sleep. MAR shows Pt received Vistaril last night and received Ativan this morning. Pt denied any withdrawal symptoms such as headache, nausea, vomiting, or diarrhea. Pt stated he had not been using substances for several days prior to coming to the hospital. He is sad and depressed about his loss of support from his family.  Pt stated he went to the cafeteria for breakfast and plans to attend groups today. Pt denies suicidal thoughts or plan and contracts for safety on the unit. Pt stated he has not talked to his family yet but plans to try to contact his father and sister again today. Labs were drawn this morning; TSH, Lipid panel, HgbA1c, HIV antibody and Hep C  have not resulted yet.  Will continue to offer encouragement and support.   Principal Problem: Severe recurrent major depression without psychotic features (HCC) Diagnosis: Principal Problem:   Severe recurrent major depression without psychotic features (HCC) Active Problems:   Polysubstance abuse (HCC)   Generalized anxiety disorder   PTSD (post-traumatic stress disorder)  Total Time spent with patient: 30 minutes  Past Psychiatric History: As above  Past Medical History:  Past Medical History:  Diagnosis Date  . Acne nodule    on going   History reviewed. No pertinent surgical history. Family History:  Family History  Problem Relation Age of Onset  . Bipolar disorder Mother    Family Psychiatric  History: Mother - Bipolar, Borderline personality, substance abuse Social History:  Social History   Substance and Sexual Activity  Alcohol Use Yes  . Alcohol/week: 6.0 standard drinks  . Types: 6 Shots of liquor per week   Comment: daily drinker     Social History   Substance and Sexual Activity  Drug Use Yes  . Types: Marijuana, Oxycodone    Social History   Socioeconomic History  . Marital status: Single    Spouse name: Not on file  . Number of children: Not on file  . Years of education: Not on file  . Highest education level: Not on file  Occupational History  . Not on file  Social Needs  . Financial resource strain: Not on file  . Food insecurity:    Worry:  Not on file    Inability: Not on file  . Transportation needs:    Medical: Not on file    Non-medical: Not on file  Tobacco Use  . Smoking status: Current Every Day Smoker    Packs/day: 1.00    Years: 5.00    Pack years: 5.00    Types: Cigarettes  . Smokeless tobacco: Never Used  Substance and Sexual Activity  . Alcohol use: Yes    Alcohol/week: 6.0 standard drinks    Types: 6 Shots of liquor per week    Comment: daily drinker  . Drug use: Yes    Types: Marijuana, Oxycodone  . Sexual activity: Yes    Birth  control/protection: Condom  Lifestyle  . Physical activity:    Days per week: Not on file    Minutes per session: Not on file  . Stress: Not on file  Relationships  . Social connections:    Talks on phone: Not on file    Gets together: Not on file    Attends religious service: Not on file    Active member of club or organization: Not on file    Attends meetings of clubs or organizations: Not on file    Relationship status: Not on file  Other Topics Concern  . Not on file  Social History Narrative  . Not on file   Additional Social History:      Sleep: Good  Appetite:  Good  Current Medications: Current Facility-Administered Medications  Medication Dose Route Frequency Provider Last Rate Last Dose  . acetaminophen (TYLENOL) tablet 650 mg  650 mg Oral Q6H PRN Nira ConnBerry, Jason A, NP      . alum & mag hydroxide-simeth (MAALOX/MYLANTA) 200-200-20 MG/5ML suspension 30 mL  30 mL Oral Q4H PRN Nira ConnBerry, Jason A, NP      . hydrOXYzine (ATARAX/VISTARIL) tablet 25 mg  25 mg Oral Q6H PRN Nira ConnBerry, Jason A, NP   25 mg at 11/23/18 2217  . loperamide (IMODIUM) capsule 2-4 mg  2-4 mg Oral PRN Nira ConnBerry, Jason A, NP      . LORazepam (ATIVAN) tablet 1 mg  1 mg Oral Q6H PRN Nira ConnBerry, Jason A, NP      . LORazepam (ATIVAN) tablet 1 mg  1 mg Oral TID Nira ConnBerry, Jason A, NP   1 mg at 11/24/18 1205   Followed by  . [START ON 11/25/2018] LORazepam (ATIVAN) tablet 1 mg  1 mg Oral BID Jackelyn PolingBerry, Jason A, NP       Followed by  . [START ON 11/27/2018] LORazepam (ATIVAN) tablet 1 mg  1 mg Oral Daily Nira ConnBerry, Jason A, NP      . magnesium hydroxide (MILK OF MAGNESIA) suspension 30 mL  30 mL Oral Daily PRN Nira ConnBerry, Jason A, NP      . multivitamin with minerals tablet 1 tablet  1 tablet Oral Daily Nira ConnBerry, Jason A, NP   1 tablet at 11/24/18 0933  . nicotine (NICODERM CQ - dosed in mg/24 hours) patch 21 mg  21 mg Transdermal Daily Cobos, Rockey SituFernando A, MD   21 mg at 11/24/18 1203  . OLANZapine (ZYPREXA) tablet 2.5 mg  2.5 mg Oral QHS Cobos,  Rockey SituFernando A, MD   2.5 mg at 11/23/18 2128  . ondansetron (ZOFRAN-ODT) disintegrating tablet 4 mg  4 mg Oral Q6H PRN Nira ConnBerry, Jason A, NP      . thiamine (VITAMIN B-1) tablet 100 mg  100 mg Oral Daily Nira ConnBerry, Jason A, NP   100 mg at  11/24/18 1203  . traZODone (DESYREL) tablet 100 mg  100 mg Oral Once Nira Conn A, NP      . traZODone (DESYREL) tablet 50 mg  50 mg Oral QHS PRN,MR X 1 Nira Conn A, NP   50 mg at 11/23/18 2217    Lab Results:  Results for orders placed or performed during the hospital encounter of 11/23/18 (from the past 48 hour(s))  CBC     Status: None   Collection Time: 11/23/18 10:08 AM  Result Value Ref Range   WBC 9.2 4.0 - 10.5 K/uL   RBC 4.69 4.22 - 5.81 MIL/uL   Hemoglobin 14.1 13.0 - 17.0 g/dL   HCT 16.1 09.6 - 04.5 %   MCV 89.3 80.0 - 100.0 fL   MCH 30.1 26.0 - 34.0 pg   MCHC 33.7 30.0 - 36.0 g/dL   RDW 40.9 81.1 - 91.4 %   Platelets 299 150 - 400 K/uL   nRBC 0.0 0.0 - 0.2 %    Comment: Performed at [facility], [address], [place], [place] [address]  Comprehensive metabolic panel     Status: Abnormal   Collection Time: 11/23/18 10:08 AM  Result Value Ref Range   Sodium 142 135 - 145 mmol/L   Potassium 3.6 3.5 - 5.1 mmol/L   Chloride 107 98 - 111 mmol/L   CO2 29 22 - 32 mmol/L   Glucose, Bld 93 70 - 99 mg/dL   BUN 18 6 - 20 mg/dL   Creatinine, Ser 6.21 0.61 - 1.24 mg/dL   Calcium 9.0 8.9 - 30.8 mg/dL   Total Protein 6.8 6.5 - 8.1 g/dL   Albumin 3.9 3.5 - 5.0 g/dL   AST 19 15 - 41 U/L   ALT 24 0 - 44 U/L   Alkaline Phosphatase 54 38 - 126 U/L   Total Bilirubin 0.2 (L) 0.3 - 1.2 mg/dL   GFR calc non Af Amer >60 >60 mL/min   GFR calc Af Amer >60 >60 mL/min   Anion gap 6 5 - 15    Comment: Performed at [facility], [address] [place], [place] [address]  Ethanol     Status: None   Collection Time: 11/23/18 10:08 AM  Result Value Ref Range   Alcohol, Ethyl (B) <10 <10 mg/dL    Comment: (NOTE) Lowest  detectable limit for serum alcohol is 10 mg/dL. For medical purposes only. Performed at [facility], [address], [place], [place] [address]   Rapid urine drug screen (hospital performed)     Status: Abnormal   Collection Time: 11/23/18 10:09 AM  Result Value Ref Range   Opiates NONE DETECTED NONE DETECTED   Cocaine POSITIVE (A) NONE DETECTED   Benzodiazepines NONE DETECTED NONE DETECTED   Amphetamines NONE DETECTED NONE DETECTED   Tetrahydrocannabinol POSITIVE (A) NONE DETECTED   Barbiturates NONE DETECTED NONE DETECTED    Comment: (NOTE) DRUG SCREEN FOR MEDICAL PURPOSES ONLY.  IF CONFIRMATION IS NEEDED FOR ANY PURPOSE, NOTIFY LAB WITHIN 5 DAYS. LOWEST DETECTABLE LIMITS FOR URINE DRUG SCREEN Drug Class                     Cutoff (ng/mL) Amphetamine and metabolites    1000 Barbiturate and metabolites    200 Benzodiazepine                 200 Tricyclics and metabolites     300 Opiates and metabolites  300 Cocaine and metabolites        300 THC                            50 Performed at [facility], [address], [place], [place] [address]     Blood Alcohol level:  Lab Results  Component Value Date   [facility] <10 11/23/2018   ETH <11 08/04/2014    Metabolic Disorder Labs: No results found for: HGBA1C, MPG No results found for: PROLACTIN No results found for: CHOL, TRIG, HDL, CHOLHDL, VLDL, LDLCALC  Physical Findings: AIMS: Facial and Oral Movements Muscles of Facial Expression: None, normal Lips and Perioral Area: None, normal Jaw: None, normal Tongue: None, normal,Extremity Movements Upper (arms, wrists, hands, fingers): None, normal Lower (legs, knees, ankles, toes): None, normal, Trunk Movements Neck, shoulders, hips: None, normal, Overall Severity Severity of abnormal movements (highest score from questions above): None, normal Incapacitation due to abnormal movements: None, normal Patient's awareness of abnormal  movements (rate only patient's report): No Awareness, Dental Status Current problems with teeth and/or dentures?: No Does patient usually wear dentures?: No  CIWA:  CIWA-Ar Total: 3 COWS:     Musculoskeletal: Strength & Muscle Tone: within normal limits Gait & Station: normal Patient leans: N/A  Psychiatric Specialty Exam: Physical Exam  Constitutional: He is oriented to person, place, and time. He appears well-developed and well-nourished.  HENT:  Head: Normocephalic.  Musculoskeletal: Normal range of motion.  Neurological: He is alert and oriented to person, place, and time.  Psychiatric: His speech is normal and behavior is normal. Thought content normal. His mood appears anxious. Cognition and memory are normal. He expresses impulsivity. He exhibits a depressed mood.    ROS  Blood pressure 120/64, pulse 67, temperature (!) 97.5 F (36.4 C), temperature source Oral, resp. rate 18, height 6\' 2"  (1.88 m), weight 78.5 kg, SpO2 100 %.Body mass index is 22.22 kg/m.  General Appearance: Casual  Eye Contact:  Fair  Speech:  Clear and Coherent and Slow  Volume:  Decreased  Mood:  Depressed  Affect:  Congruent and Depressed  Thought Process:  Coherent, Linear and Descriptions of Associations: Intact  Orientation:  Full (Time, Place, and Person)  Thought Content:  Logical  Suicidal Thoughts:  No, denies  Homicidal Thoughts:  No, denies  Memory:  Immediate;   Good Recent;   Good Remote;   Fair  Judgement:  Fair  Insight:  Present  Psychomotor Activity:  Normal  Concentration:  Concentration: Good and Attention Span: Good  Recall:  Good  Fund of Knowledge:  Good  Language:  Good  Akathisia:  No  Handed:  Right  AIMS (if indicated):     Assets:  Architect Housing Physical Health Social Support Vocational/Educational  ADL's:  Intact  Cognition:  WNL  Sleep:        Treatment Plan Summary: Daily contact with patient to assess  and evaluate symptoms and progress in treatment and Medication management  Medication management: -Continue CIWA protocol as ordered  -Trazodone 50 mg qhs PRN for sleep -Vistaril 25 mg q6h PRN for anxiety  Pt will attend group therapy and participate in the therapeutic milieu Continue to offer encouragement and support  Discharge planning ongoing   Laveda Abbe, NP 11/24/2018, 12:55 PM   ..Agree with NP Progress Note

## 2018-11-24 NOTE — [facility] Group Notes (Signed)
[facility] Group Notes: (Clinical Social Work)   11/24/2018      Type of Therapy:  Group Therapy   Participation Level:  Did Not Attend despite MHT prompting   Sabrea Sankey Grossman-Orr, LCSW 11/24/2018, 12:36 PM         

## 2018-11-24 NOTE — Progress Notes (Signed)
D: Pt denies SI/HI/AVH. Pt is pleasant and cooperative. Pt hyper verbal this evening on the unit. Pt endorsed high amounts of benzo use.   A: Pt was offered support and encouragement. Pt was given scheduled medications. Pt was encourage to attend groups. Q 15 minute checks were done for safety. EKG done  R:Pt attends groups and interacts well with peers and staff. Pt is taking medication. Pt has no complaints.Pt receptive to treatment and safety maintained on unit.   Problem: Education: Goal: Emotional status will improve Outcome: Progressing   Problem: Education: Goal: Mental status will improve Outcome: Progressing

## 2018-11-24 NOTE — [facility] Suicide Risk Assessment (Signed)
[facility] INPATIENT:  Family/Significant Other Suicide Prevention Education  Suicide Prevention Education:  Patient Refusal for Family/Significant Other Suicide Prevention Education: The patient John [name] has refused to provide written consent for family/significant other to be provided Family/Significant Other Suicide Prevention Education during admission and/or prior to discharge.  Physician notified.  Suicide Prevention Education was reviewed thoroughly with patient, including risk factors, warning signs, and what to do.  Mobile Crisis services were described and that telephone number pointed out, with encouragement to patient to put this number in personal cell phone.  Brochure was provided to patient to share with natural supports.  Patient acknowledged the ways in which they are at risk, and how working through each of their issues can gradually start to reduce their risk factors.  Patient was encouraged to think of the information in the context of people in their own lives.  Patient denied having access to firearms  Patient verbalized understanding of information provided.   Carloyn Jaeger Grossman-Orr 11/24/2018, 3:27 PM

## 2018-11-24 NOTE — Plan of Care (Signed)
  Problem: Education: Goal: Knowledge of [facility] Education information/materials will improve Outcome: Progressing   
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
## 2018-11-25 MED ORDER — PRAZOSIN HCL 2 MG PO CAPS
2.0000 mg | ORAL_CAPSULE | Freq: Every day | ORAL | Status: DC
  Filled 2018-11-25 (×2): qty 1

## 2018-11-25 MED ORDER — GABAPENTIN 300 MG PO CAPS: 300.0000 mg | ORAL_CAPSULE | Freq: Three times a day (TID) | ORAL | 1 refills | Status: DC

## 2018-11-25 MED ORDER — BOOST / RESOURCE BREEZE PO LIQD CUSTOM
1.0000 | ORAL | Status: DC
  Filled 2018-11-25 (×3): qty 1

## 2018-11-25 MED ORDER — PRAZOSIN HCL 2 MG PO CAPS: 2.0000 mg | ORAL_CAPSULE | Freq: Every day | ORAL | 1 refills | Status: DC

## 2018-11-25 MED ORDER — GABAPENTIN 300 MG PO CAPS
300.0000 mg | ORAL_CAPSULE | Freq: Three times a day (TID) | ORAL | Status: DC
  Filled 2018-11-25 (×6): qty 1

## 2018-11-25 MED ORDER — ENSURE ENLIVE PO LIQD: 237.0000 mL | ORAL | Status: DC

## 2018-11-25 NOTE — Progress Notes (Signed)
Patient refused to review discharge paperwork with Clinical research associate. Patient also refused prescriptions after writer and NP discussed medications with patient. Patient said several times "I just want to get out of here, this place sucks." Discharge paperwork was given to patient (except for prescriptions). Denied SI HI AVH.  Patient's family friend was waiting in the lobby to pick him up.

## 2018-11-25 NOTE — Progress Notes (Signed)
Patient spoke with CSW several times regarding feelings toward medication changes and wanting to speak with a different doctor.  Patient states he completed a phone screening with [facility] in [place] and they have accepted the patient for a 90 day program.  Patient inquiring if he can discharge around noon, CSW stated this should not be a problem. Friend will pick up patient and bring him to the treatment facility.  Enid Cutter, LCSW-A Clinical Social Worker

## 2018-11-25 NOTE — Discharge Summary (Signed)
Physician Discharge Summary Note  Patient:  John [name] is an 23 y.o., male  MRN:  [id number]  DOB:  21-Jan-1996  Patient phone:  [phone] (home)  Patient address:   [address] [place] [address],   Total Time spent with patient: Greater than 30 minutes  Date of Admission:  11/23/2018  Date of Discharge: 11-25-18  Reason for Admission: Presented voluntarily requesting assistance for alcohol and drug abuse. Reports he has been more anxious and  depressed recently & has had intermittent suicidal ideations, which he describes as passive.   Principal Problem: Severe recurrent major depression without psychotic features [facility])  Discharge Diagnoses: Principal Problem:   Severe recurrent major depression without psychotic features (HCC) Active Problems:   Polysubstance abuse (HCC)   Generalized anxiety disorder   PTSD (post-traumatic stress disorder)  Past Psychiatric History: Major depressive disorder.  Past Medical History:  Past Medical History:  Diagnosis Date  . Acne nodule    on going   History reviewed. No pertinent surgical history.  Family History:  Family History  Problem Relation Age of Onset  . Bipolar disorder Mother    Family Psychiatric  History: See H&P.  Social History:  Social History   Substance and Sexual Activity  Alcohol Use Yes  . Alcohol/week: 6.0 standard drinks  . Types: 6 Shots of liquor per week   Comment: daily drinker     Social History   Substance and Sexual Activity  Drug Use Yes  . Types: Marijuana, Oxycodone    Social History   Socioeconomic History  . Marital status: Single    Spouse name: Not on file  . Number of children: Not on file  . Years of education: Not on file  . Highest education level: Not on file  Occupational History  . Not on file  Social Needs  . Financial resource strain: Not on file  . Food insecurity:    Worry: Not on file    Inability: Not on file  . Transportation needs:    Medical: Not on  file    Non-medical: Not on file  Tobacco Use  . Smoking status: Current Every Day Smoker    Packs/day: 1.00    Years: 5.00    Pack years: 5.00    Types: Cigarettes  . Smokeless tobacco: Never Used  Substance and Sexual Activity  . Alcohol use: Yes    Alcohol/week: 6.0 standard drinks    Types: 6 Shots of liquor per week    Comment: daily drinker  . Drug use: Yes    Types: Marijuana, Oxycodone  . Sexual activity: Yes    Birth control/protection: Condom  Lifestyle  . Physical activity:    Days per week: Not on file    Minutes per session: Not on file  . Stress: Not on file  Relationships  . Social connections:    Talks on phone: Not on file    Gets together: Not on file    Attends religious service: Not on file    Active member of club or organization: Not on file    Attends meetings of clubs or organizations: Not on file    Relationship status: Not on file  Other Topics Concern  . Not on file  Social History Narrative  . Not on file   Hospital Course: (Per Md's admission evaluation): 22, single , no children, currently unemployed, no legal issues,was living with grandparents, states he was recently kicked out due to drug abuse, after which he has  been staying with friends. Presented to the ED voluntarily requesting assistance for alcohol and drug abuse . Reports he has been more anxious and  depressed recently, and has had intermittent suicidal ideations, which he describes as passive . States " its like I would rather be dead than having my whole family loathing me". (States he is not currently suicidal , feels better now that he is admitted and hopeful he will be able to work on his sobriety.) Endorses increased anxiety and  frequent panic attacks recently. Reports neuro-vegetative symptoms of depression- poor sleep, poor appetite ( currently improving), poor energy level, anhedonia. Of note, presents with pressured speech, subjective sense of " racing thoughts". Affect not  irritable. States he has been drinking several times a week, usually 80 ounces a day, but sometimes more , resulting in recent blackouts. He reports he has been using Methamphetamine over the last several weeks ( IV) . States IV use is new, started 3-4 weeks ago, and that it represents a progression, worsening of his substance abuse . He has also been using Heroin, Cocaine but not regularly.  Last used Methamphetamine 1 week ago. Reports he last drank yesterday. Admission BAL negative, UDS positive for Cocaine and Cannabis. Reports prior history of mood disorder, anxiety, and states he has been diagnosed with " Manic Depressive", ADHD, Panic Disorder, PTSD, Borderline Personality Disorder in the past . States he had improved, felt more stable on; Adderall, Klonopin, Remeron in the past, but has not been on any psychiatric medication for several months due to insurance constraints. History of one episode of self cutting/suicidal attempt 3 years ago. We reviewed medication history- states he has tried several SSRIs in the past without good effect, tried Seroquel but felt excessively sedated, and that Abilify was not particularly helpful. Denies medical illnesses. States he is allergic to [employer].   After the above admission assessment, John [name] was recommended for drug & mood stabilization treatments. The medication regimen for his presenting symptoms were discussed & initiated with his consent. He was enrolled & participated in the group counseling sessions being offered & held on this unit. He learned coping skills. Part of his discharge plans was a recommendation for a psychiatric clinic with substance abuse treatment program for further substance abuse treatment after discharge.  Although presented voluntarily to the hospital seeking help for his drug addictions, however, John [name] was also noted to have strong drug seeking behaviors. He did report on admission on how many/several psychotropic medications he has  tried over the course of time for his mental health & none has been of any help to him. He did say the most helpful for him were; Mirtazapine, Adderall & Klonopin. He was adamant about resuming/discharged on the Klonopin as he believed it was the only medication that has ever worked for him. And if he would not be prescribed the Klonopin upon discharge, he would rather not discharge with any medications at all. [facility] adamantly declined to take his prescriptions for his discharge medications upon discharge. He says he will rather leave [facility] to his treatment program & figure it out from there.  Upon discharge, Jakevious presents mentally & medically stable. He adamantly denies any SIHI, AVH, delusional thoughts or paranoia. He will continue further mental health care & substance abuse treatment as noted below. He is provided with all the necessary information needed to make this appointment without problems. He was able to engage in safety planning including plan to return to [facility] or contact emergency services if  he feels unable to maintain his own safety or the safety of others. Pt had no further questions, comments or concerns. [facility] with all personal belongings in no apparent distress.  Physical Findings: AIMS: Facial and Oral Movements Muscles of Facial Expression: None, normal Lips and Perioral Area: None, normal Jaw: None, normal Tongue: None, normal,Extremity Movements Upper (arms, wrists, hands, fingers): None, normal Lower (legs, knees, ankles, toes): None, normal, Trunk Movements Neck, shoulders, hips: None, normal, Overall Severity Severity of abnormal movements (highest score from questions above): None, normal Incapacitation due to abnormal movements: None, normal Patient's awareness of abnormal movements (rate only patient's report): No Awareness, Dental Status Current problems with teeth and/or dentures?: No Does patient usually wear dentures?: No  CIWA:  CIWA-Ar Total: 4 COWS:      Musculoskeletal: Strength & Muscle Tone: within normal limits Gait & Station: normal Patient leans: N/A  Psychiatric Specialty Exam: Physical Exam  Nursing note and vitals reviewed. Constitutional: He is oriented to person, place, and time. He appears well-developed.  HENT:  Head: Normocephalic.  Eyes: Pupils are equal, round, and reactive to light.  Neck: Normal range of motion.  Cardiovascular: Normal rate.  Respiratory: Effort normal.  GI: Soft.  Genitourinary:    Genitourinary Comments: Deferred   Musculoskeletal: Normal range of motion.  Neurological: He is alert and oriented to person, place, and time.  Skin: Skin is warm and dry.    Review of Systems  Constitutional: Negative.   HENT: Negative.   Eyes: Negative.   Respiratory: Negative.  Negative for cough and shortness of breath.   Cardiovascular: Negative.  Negative for chest pain and palpitations.  Gastrointestinal: Negative.  Negative for abdominal pain, heartburn, nausea and vomiting.  Genitourinary: Negative.   Musculoskeletal: Negative.   Skin: Negative.   Neurological: Negative.  Negative for dizziness and headaches.  Endo/Heme/Allergies: Negative.   Psychiatric/Behavioral: Positive for depression (Stable) and substance abuse (Hx. Cocaine & THC use disorder). Negative for hallucinations, memory loss and suicidal ideas. The patient is not nervous/anxious (Stable) and does not have insomnia.     Blood pressure 132/76, pulse 84, temperature 99.4 F (37.4 C), temperature source Oral, resp. rate 16, height 6\' 2"  (1.88 m), weight 78.5 kg, SpO2 100 %.Body mass index is 22.22 kg/m.  See Md's discharge SRA   Have you used any form of tobacco in the last 30 days? (Cigarettes, Smokeless Tobacco, Cigars, and/or Pipes): Yes  Has this patient used any form of tobacco in the last 30 days? (Cigarettes, Smokeless Tobacco, Cigars, and/or Pipes): N/A  Blood Alcohol level:  Lab Results  Component Value Date   [facility] <10  11/23/2018   ETH <11 08/04/2014   Metabolic Disorder Labs:  Lab Results  Component Value Date   HGBA1C 5.5 11/24/2018   MPG 111.15 11/24/2018   No results found for: PROLACTIN Lab Results  Component Value Date   CHOL 163 11/24/2018   TRIG 156 (H) 11/24/2018   HDL 58 11/24/2018   CHOLHDL 2.8 11/24/2018   VLDL 31 11/24/2018   LDLCALC 74 11/24/2018   See Psychiatric Specialty Exam and Suicide Risk Assessment completed by Attending Physician prior to discharge.  Discharge destination:  Home  Is patient on multiple antipsychotic therapies at discharge:  No   Has Patient had three or more failed trials of antipsychotic monotherapy by history:  No  Recommended Plan for Multiple Antipsychotic Therapies: NA  Allergies as of 11/25/2018      Reactions   Tessalon [benzonatate] Other (See  Comments)   Chest pain      Medication List    STOP taking these medications   hydrOXYzine 25 MG tablet Commonly known as:  ATARAX/VISTARIL   ibuprofen 600 MG tablet Commonly known as:  ADVIL,MOTRIN   LORazepam 1 MG tablet Commonly known as:  ATIVAN   predniSONE 20 MG tablet Commonly known as:  DELTASONE   sulfamethoxazole-trimethoprim 800-160 MG per tablet Commonly known as:  BACTRIM DS   traZODone 50 MG tablet Commonly known as:  DESYREL     TAKE these medications     Indication  albuterol 108 (90 Base) MCG/ACT inhaler Commonly known as:  PROVENTIL HFA;VENTOLIN HFA Inhale 1-2 puffs into the lungs every 6 (six) hours as needed (cough).  Indication:  Asthma   gabapentin 300 MG capsule Commonly known as:  NEURONTIN Take 1 capsule (300 mg total) by mouth 3 (three) times daily.  Indication:  Abuse or Misuse of Alcohol   lamoTRIgine 25 MG tablet Commonly known as:  LAMICTAL Take 1 tablet (25 mg total) by mouth daily. For mood stabilization  Indication:  Mood stabilization   prazosin 2 MG capsule Commonly known as:  MINIPRESS Take 1 capsule (2 mg total) by mouth at bedtime.   Indication:  Frightening Dreams      Follow-up Information    [facility]. Go to.   Why:  Intake on 11/25/2018 Contact information: [address]  [place], [place] [address] ph: [phone] fax: [phone]       [facility]. Go on 11/27/2018.   Why:  Please attend your appointment on 11/27/2018 at 8:00am. Contact information: [address] [place], [place] [address]  [phone]         Follow-up recommendations: Activity:  As tolerated Diet: As recommended by your primary care doctor. Keep all scheduled follow-up appointments as recommended.  Comments:  Patient is instructed prior to discharge to: Take all medications as prescribed by his/her mental healthcare provider. Report any adverse effects and or reactions from the medicines to his/her outpatient provider promptly. Patient has been instructed & cautioned: To not engage in alcohol and or illegal drug use while on prescription medicines. In the event of worsening symptoms, patient is instructed to call the crisis hotline, 911 and or go to the nearest ED for appropriate evaluation and treatment of symptoms. To follow-up with his/her primary care provider for your other medical issues, concerns and or health care needs.   Signed: Armandina Stammer, NP, PMHNP, FNP-BC 11/25/2018, 3:04 PM

## 2018-11-25 NOTE — Progress Notes (Signed)
Pt presents irritable, anxious, and focused on medication.  He repeatedly expresses his frustration over not getting the medication he wants, reporting he needs a higher dose of a benzodiazepine and stating "I've been having anxiety attacks all day!"  Pt states "I told him I need my Klonopin and Remeron."  Pt denies SI/HI and hallucinations.  He reports lower back pain of 7/10.  Pt reports he tried to attend AA group tonight.   Introduced self to pt.  Actively listened to pt and offered support and encouragement.  Medication offered per order.  PRN medication administered for anxiety and sleep.  Positive coping skills encouraged.  Q15 minute safety checks maintained.  Pt verbally contracts for safety.  He was compliant with PRN medications and refused scheduled Zyprexa.  He is safe on the unit.  Will continue to monitor and assess.

## 2018-11-25 NOTE — Progress Notes (Signed)
Recreation Therapy Notes  Date: 1.13.20 Time: 0930 Location: 300 Hall Dayroom  Group Topic: Stress Management  Goal Area(s) Addresses:  Patient will identify stress management techniques. Patient will identify benefits of using stress management techniques post d/c.  Behavioral Response: Engaged  Intervention: Stress Management  Activity :  Meditation.  LRT introduced the stress management technique of meditation.  LRT played a meditation on being resilient in the face of adversity.  Patients were to follow along as meditation played to engage in activity.  Education:  Stress Management, Discharge Planning.   Education Outcome: Acknowledges Education  Clinical Observations/Feedback: Pt attended and participated in activity.   Caroll RancherMarjette Wilfred Dayrit, LRT/CTRS         Caroll RancherLindsay, Magdeline Prange A 11/25/2018 11:49 AM

## 2018-11-25 NOTE — Tx Team (Signed)
Interdisciplinary Treatment and Diagnostic Plan Update  11/25/2018 Time of Session: 0830AM John SessionGabriel Helm MRN: [id number]  Principal Diagnosis: Severe recurrent major depression without psychotic features [facility](HCC)  Secondary Diagnoses: Principal Problem:   Severe recurrent major depression without psychotic features (HCC) Active Problems:   Polysubstance abuse (HCC)   Generalized anxiety disorder   PTSD (post-traumatic stress disorder)   Current Medications:  Current Facility-Administered Medications  Medication Dose Route Frequency Provider Last Rate Last Dose  . acetaminophen (TYLENOL) tablet 650 mg  650 mg Oral Q6H PRN Nira ConnBerry, Jason A, NP   650 mg at 11/24/18 1607  . alum & mag hydroxide-simeth (MAALOX/MYLANTA) 200-200-20 MG/5ML suspension 30 mL  30 mL Oral Q4H PRN Nira ConnBerry, Jason A, NP      . hydrOXYzine (ATARAX/VISTARIL) tablet 25 mg  25 mg Oral Q6H PRN Nira ConnBerry, Jason A, NP   25 mg at 11/24/18 2016  . loperamide (IMODIUM) capsule 2-4 mg  2-4 mg Oral PRN Nira ConnBerry, Jason A, NP      . magnesium hydroxide (MILK OF MAGNESIA) suspension 30 mL  30 mL Oral Daily PRN Nira ConnBerry, Jason A, NP      . multivitamin with minerals tablet 1 tablet  1 tablet Oral Daily Nira ConnBerry, Jason A, NP   1 tablet at 11/25/18 0753  . nicotine (NICODERM CQ - dosed in mg/24 hours) patch 21 mg  21 mg Transdermal Daily Cobos, Rockey SituFernando A, MD   21 mg at 11/25/18 0752  . OLANZapine (ZYPREXA) tablet 2.5 mg  2.5 mg Oral QHS Cobos, Rockey SituFernando A, MD   2.5 mg at 11/23/18 2128  . ondansetron (ZOFRAN-ODT) disintegrating tablet 4 mg  4 mg Oral Q6H PRN Nira ConnBerry, Jason A, NP      . thiamine (VITAMIN B-1) tablet 100 mg  100 mg Oral Daily Nira ConnBerry, Jason A, NP   100 mg at 11/25/18 0753  . traZODone (DESYREL) tablet 100 mg  100 mg Oral Once Nira ConnBerry, Jason A, NP      . traZODone (DESYREL) tablet 50 mg  50 mg Oral QHS PRN,MR X 1 Nira ConnBerry, Jason A, NP   50 mg at 11/24/18 2150   PTA Medications: Medications Prior to Admission  Medication Sig Dispense Refill Last  Dose  . albuterol (PROVENTIL HFA;VENTOLIN HFA) 108 (90 Base) MCG/ACT inhaler Inhale 1-2 puffs into the lungs every 6 (six) hours as needed (cough). 1 Inhaler 0 unknown  . hydrOXYzine (ATARAX/VISTARIL) 25 MG tablet Take 1 tablet (25 mg) three times daily as needed: For anxiety (Patient not taking: Reported on 07/27/2018) 60 tablet 0 Not Taking at Unknown time  . ibuprofen (ADVIL,MOTRIN) 600 MG tablet Take 1 tablet (600 mg total) by mouth every 6 (six) hours as needed. (Patient not taking: Reported on 11/23/2018) 30 tablet 0 Completed Course at Unknown time  . lamoTRIgine (LAMICTAL) 25 MG tablet Take 1 tablet (25 mg total) by mouth daily. For mood stabilization (Patient not taking: Reported on 07/27/2018) 30 tablet 0 Not Taking at Unknown time  . LORazepam (ATIVAN) 1 MG tablet Take 1 tablet (1 mg total) by mouth every 6 (six) hours as needed for anxiety. (Patient not taking: Reported on 07/27/2018) 10 tablet 0 Not Taking at Unknown time  . predniSONE (DELTASONE) 20 MG tablet Take 2 tablets (40 mg total) by mouth daily. (Patient not taking: Reported on 07/27/2018) 10 tablet 0 Not Taking at Unknown time  . sulfamethoxazole-trimethoprim (BACTRIM DS) 800-160 MG per tablet Take 1 tablet by mouth 2 (two) times daily. For facial acne (Patient not taking:  Reported on 07/27/2018)   Not Taking at Unknown time  . traZODone (DESYREL) 50 MG tablet Take 1 tablet (50 mg total) by mouth at bedtime. For sleep (Patient not taking: Reported on 07/27/2018) 30 tablet 0 Not Taking at Unknown time    Patient Stressors: Financial difficulties Health problems Medication change or noncompliance Occupational concerns Substance abuse  Patient Strengths: Ability for insight Capable of independent living Communication skills General fund of knowledge Supportive family/friends Work skills  Treatment Modalities: Medication Management, Group therapy, Case management,  1 to 1 session with clinician, Psychoeducation, Recreational  therapy.   Physician Treatment Plan for Primary Diagnosis: Severe recurrent major depression without psychotic features (HCC) Long Term Goal(s): Improvement in symptoms so as ready for discharge Improvement in symptoms so as ready for discharge   Short Term Goals: Ability to identify changes in lifestyle to reduce recurrence of condition will improve Ability to verbalize feelings will improve Ability to disclose and discuss suicidal ideas Ability to demonstrate self-control will improve Ability to identify and develop effective coping behaviors will improve Ability to maintain clinical measurements within normal limits will improve Compliance with prescribed medications will improve Ability to identify triggers associated with substance abuse/mental health issues will improve Ability to identify changes in lifestyle to reduce recurrence of condition will improve Ability to verbalize feelings will improve Ability to disclose and discuss suicidal ideas Ability to demonstrate self-control will improve Ability to identify and develop effective coping behaviors will improve Ability to maintain clinical measurements within normal limits will improve Compliance with prescribed medications will improve Ability to identify triggers associated with substance abuse/mental health issues will improve  Medication Management: Evaluate patient's response, side effects, and tolerance of medication regimen.  Therapeutic Interventions: 1 to 1 sessions, Unit Group sessions and Medication administration.  Evaluation of Outcomes: Adequate for Discharge  Physician Treatment Plan for Secondary Diagnosis: Principal Problem:   Severe recurrent major depression without psychotic features (HCC) Active Problems:   Polysubstance abuse (HCC)   Generalized anxiety disorder   PTSD (post-traumatic stress disorder)  Long Term Goal(s): Improvement in symptoms so as ready for discharge Improvement in symptoms so as  ready for discharge   Short Term Goals: Ability to identify changes in lifestyle to reduce recurrence of condition will improve Ability to verbalize feelings will improve Ability to disclose and discuss suicidal ideas Ability to demonstrate self-control will improve Ability to identify and develop effective coping behaviors will improve Ability to maintain clinical measurements within normal limits will improve Compliance with prescribed medications will improve Ability to identify triggers associated with substance abuse/mental health issues will improve Ability to identify changes in lifestyle to reduce recurrence of condition will improve Ability to verbalize feelings will improve Ability to disclose and discuss suicidal ideas Ability to demonstrate self-control will improve Ability to identify and develop effective coping behaviors will improve Ability to maintain clinical measurements within normal limits will improve Compliance with prescribed medications will improve Ability to identify triggers associated with substance abuse/mental health issues will improve     Medication Management: Evaluate patient's response, side effects, and tolerance of medication regimen.  Therapeutic Interventions: 1 to 1 sessions, Unit Group sessions and Medication administration.  Evaluation of Outcomes: Adequate for Discharge   RN Treatment Plan for Primary Diagnosis: Severe recurrent major depression without psychotic features (HCC) Long Term Goal(s): Knowledge of disease and therapeutic regimen to maintain health will improve  Short Term Goals: Ability to remain free from injury will improve, Ability to demonstrate self-control, Ability to  verbalize feelings will improve and Ability to identify and develop effective coping behaviors will improve  Medication Management: RN will administer medications as ordered by provider, will assess and evaluate patient's response and provide education to  patient for prescribed medication. RN will report any adverse and/or side effects to prescribing provider.  Therapeutic Interventions: 1 on 1 counseling sessions, Psychoeducation, Medication administration, Evaluate responses to treatment, Monitor vital signs and CBGs as ordered, Perform/monitor CIWA, COWS, AIMS and Fall Risk screenings as ordered, Perform wound care treatments as ordered.  Evaluation of Outcomes: Adequate for Discharge   LCSW Treatment Plan for Primary Diagnosis: Severe recurrent major depression without psychotic features (HCC) Long Term Goal(s): Safe transition to appropriate next level of care at discharge, Engage patient in therapeutic group addressing interpersonal concerns.  Short Term Goals: Engage patient in aftercare planning with referrals and resources, Facilitate patient progression through stages of change regarding substance use diagnoses and concerns and Identify triggers associated with mental health/substance abuse issues  Therapeutic Interventions: Assess for all discharge needs, 1 to 1 time with Social worker, Explore available resources and support systems, Assess for adequacy in community support network, Educate family and significant other(s) on suicide prevention, Complete Psychosocial Assessment, Interpersonal group therapy.  Evaluation of Outcomes: Adequate for Discharge   Progress in Treatment: Attending groups: Yes. Participating in groups: Yes. Taking medication as prescribed: Yes. Toleration medication: Yes. Family/Significant other contact made: SPE completed with pt; pt declined to consent to collateral contact.  Patient understands diagnosis: Yes. Discussing patient identified problems/goals with staff: Yes. Medical problems stabilized or resolved: Yes. Denies suicidal/homicidal ideation: Yes. Issues/concerns per patient self-inventory: No. Other: n/a   New problem(s) identified: No, Describe:  n/a  New Short Term/Long Term Goal(s):  detox, medication management for mood stabilization; elimination of SI thoughts; development of comprehensive mental wellness/sobriety plan.   Patient Goals:  "To get off drugs and get help for my anxiety."  Discharge Plan or Barriers: CSW assessing-pt states that he is planning to enter rehab today at discharge. Likely outpatient follow-up at [facility]. MHAG pamphlet, Mobile Crisis information, and AA/NA information provided to patient for additional community support and resources.   Reason for Continuation of Hospitalization:  none  Estimated Length of Stay: today, 11/25/2018  Attendees: Patient: John [name]  11/25/2018 9:16 AM  Physician: Dr. Jeannine KittenFarah MD 11/25/2018 9:16 AM  Nursing: Huntley DecSara RN; Alyssa RN 11/25/2018 9:16 AM  RN Care Manager:x 11/25/2018 9:16 AM  Social Worker: Corrie MckusickHeather Lafern Brinkley LCSW 11/25/2018 9:16 AM  Recreational Therapist: x 11/25/2018 9:16 AM  Other: Armandina StammerAgnes Nwoko NP 11/25/2018 9:16 AM  Other:  11/25/2018 9:16 AM  Other: 11/25/2018 9:16 AM    Scribe for Treatment Team: Rona RavensHeather S Brock Larmon, LCSW 11/25/2018 9:16 AM

## 2018-11-25 NOTE — Progress Notes (Addendum)
Patient approached the medication window this morning saying he slept "okay," but his anxiety is really bad. He said "I don't know why the doctor put me on Ativan, it doesn't do anything." Writer asked what the patient takes at home. Patient said Klonopin. Writer said I would tell the doctor to see if we can do anything else to help. Patient then stated "no one here has been helpful, nothing has actually been done to help my anxiety." It is clear that all the patient is doing is med seeking for his Klonopin. Denies SI HI AVH.

## 2018-11-25 NOTE — Plan of Care (Signed)
Patient states he has rehab arranged today however he is very focused on obtaining lorazepam and/or other benzodiazepines particular Klonopin.  Was informed he is not a candidate and I would not prescribe benzodiazepines.  Though we completed our interview and he had actually planned to go to rehab today, he understands he will not get benzodiazepines there but wants to take as many as he can here before he leaves.  I told him this is a situation of substance abuse and he will not receive benzodiazepines.  He requested to see another doctor but that is not possible it would be simply staff splitting as he is seeking benzodiazepines.

## 2018-11-25 NOTE — Plan of Care (Addendum)
Patient self inventory- Slept poor last night, sleep medication was requested and not helpful. Appetite is fair, energy level hyper, concentration poor. Depression, hopelessness, and anxiety rated 7, 7, 10 out of 10. Denies withdrawal symptoms. Denies SI HI AVH. Endorses physical pain in tailbone rated 8/10. Patient's goal is "getting relief from my anxiety and racing thoughts." Patient is compliant with mediations this morning. No side effects noted. Safety is maintained with 15 minute checks as well as environmental checks. Will continue to monitor and provide support.  Problem: Education: Goal: Mental status will improve Outcome: Adequate for Discharge   Problem: Education: Goal: Verbalization of understanding the information provided will improve Outcome: Adequate for Discharge   Problem: Activity: Goal: Interest or engagement in activities will improve Outcome: Adequate for Discharge   Problem: Activity: Goal: Sleeping patterns will improve Outcome: Adequate for Discharge

## 2018-11-25 NOTE — Progress Notes (Signed)
  [facility] :  Will you be returning to the same living situation after discharge:  No. Going to 90 day rehab, [facility] in [place]. At discharge, do you have transportation home?: Yes,  friend, Ladene ArtistDerrick picking up this evening. Do you have the ability to pay for your medications: Yes,  Medicaid.   Release of information consent forms completed and in the chart.   Patient to Follow up at: Follow-up Information    [facility] [place]. Go to.   Why:  Intake on 11/25/2018 Contact information: [address]  [place], [place] [address] ph: [phone] fax: [phone]       [facility] Follow up.   Contact information: [address] [place], [place] [id number]  [phone]   Please attend your appointment on 11/27/2018 at 8:00am         Next level of care provider has access to [facility] Link:no  Safety Planning and Suicide Prevention discussed: Yes,  with patient.  Have you used any form of tobacco in the last 30 days? (Cigarettes, Smokeless Tobacco, Cigars, and/or Pipes): Yes  Has patient been referred to the Quitline?: Patient refused referral  Patient has been referred for addiction treatment: Yes  Darreld McleanCharlotte C Giorgio Chabot, LCSWA 11/25/2018, 9:57 AM

## 2018-11-25 NOTE — [facility]HH Suicide Risk Assessment (Signed)
[facility] Discharge Suicide Risk Assessment   Principal Problem: Severe recurrent major depression without psychotic features [facility]) Discharge Diagnoses: Principal Problem:   Severe recurrent major depression without psychotic features (HCC) Active Problems:   Polysubstance abuse (HCC)   Generalized anxiety disorder   PTSD (post-traumatic stress disorder) Current mental status exam-patient was alert oriented and generally cooperative without suicidal thoughts about homicidal thoughts he did elaborate on his chemical dependency history, his mother's overdose in the past, the bottom line is the patient was cordial and appropriate until he was informed that I would not prescribe Klonopin for him, and I did not believe that lorazepam was in his best interest either, he then requested another doctor but he is already informed that he wanted to leave and go to rehab today.  He understood that he would not receive benzodiazepines in rehab, again the biggest issue for this patient is discontinue benzodiazepine seeking in the presence of chronic chemical dependency issues.  Minimizes cocaine and cannabis usage  Total Time spent with patient: 30 minutes Mental Status Per Nursing Assessment::   On Admission:  Suicidal ideation indicated by patient  Demographic Factors:  Male  Loss Factors: NA  Historical Factors: Family history of suicide  Risk Reduction Factors:   Positive social support  Continued Clinical Symptoms:  Alcohol/Substance Abuse/Dependencies  Cognitive Features That Contribute To Risk:  Closed-mindedness    Suicide Risk:  Minimal: No identifiable suicidal ideation.  Patients presenting with no risk factors but with morbid ruminations; may be classified as minimal risk based on the severity of the depressive symptoms  Follow-up Information    Services, [facility] Follow up.   Why:  referral made 11/25/18.  Contact information: [address] [place]  [phone]           Plan Of Care/Follow-up recommendations:  Activity:  full  Keyshia Orwick, MD 11/25/2018, 9:15 AM

## 2018-11-25 NOTE — Plan of Care (Signed)
Catches me in the hallway after I have been getting other patients, continues to argue for Klonopin arguing that I "did not read his chart and that other doctors give it to him" I repeatedly told him it would be malpractice to give a substance abuser prescription for Klonopin he is rude and disrespectful stating "I don't want you is my doctor" and states that the medications I prescribed that we discussed, Neurontin and prazosin, are "shit that does not workScientist, water quality to discharge patient as quickly as possible as he is disruptive to the milieu, med seeking, disrespectful and not interested in anything but obtaining benzodiazepines

## 2018-11-25 NOTE — Progress Notes (Signed)
NUTRITION ASSESSMENT  Pt identified as at risk on the Malnutrition Screen Tool  INTERVENTION: - Will order Ensure Enlive once/day, this supplement provides 350 kcal and 20 grams of protein. - Will order Boost Breeze one/day, this supplement provides 250 kcal and 9 grams of protein. - Will order daily multivitamin with minerals. - Continue to encourage PO intakes.    NUTRITION DIAGNOSIS: Inadequate oral intake related to decreased appetite, substance abuse as evidenced by patient report.   Goal: Pt to meet >/= 90% of their estimated nutrition needs.  Monitor:  PO intake  Assessment:  Patient admitted for depression, anxiety, and substance abuse. Patient reported past psychiatric needs and that he has been off of his medications x6-8 months d/t not having insurance. Patient had reported being on a drug binge x5 months and that he uses alcohol, cocaine, and marijuana. Patient reported symptoms which include decreased/poor sleep and appetite.   No recent weight available. The most recent weight was on 09/05/15 at [facility] when patient weighed 195 lb. Current weight recorded as 173 lb. This indicates 22 lb weight loss (11% body weight) in the past 39 months; not significant for time frame.    23 y.o. male  Height: Ht Readings from Last 1 Encounters:  11/23/18 6\' 2"  (1.88 m)    Weight: Wt Readings from Last 1 Encounters:  11/23/18 78.5 kg    Weight Hx: Wt Readings from Last 10 Encounters:  11/23/18 78.5 kg  08/04/14 78.5 kg  01/15/13 88.5 kg (91 %, Z= 1.36)*   * Growth percentiles are based on CDC (Boys, 2-20 Years) data.    BMI:  Body mass index is 22.22 kg/m. Pt meets criteria for normal weight based on current BMI.  Estimated Nutritional Needs: Kcal: 25-30 kcal/kg Protein: > 1 gram protein/kg Fluid: 1 ml/kcal  Diet Order:  Diet Order            Diet regular Room service appropriate? Yes; Fluid consistency: Thin  Diet effective now             Pt is also  offered choice of unit snacks mid-morning and mid-afternoon.  Pt is eating as desired.   Lab results and medications reviewed.     Trenton Gammon, MS, RD, LDN, [facility] Inpatient Clinical Dietitian Pager # [phone] After hours/weekend pager # [phone]

## 2018-11-26 LAB — HEPATITIS C ANTIBODY: HCV Ab: <0.1 s/co ratio (ref 0.0–0.9)

## 2018-11-26 LAB — HIV ANTIBODY (ROUTINE TESTING W REFLEX): HIV Screen 4th Generation wRfx: NONREACTIVE

## 2018-11-26 LAB — HEPATITIS B SURFACE ANTIGEN: Hepatitis B Surface Ag: NEGATIVE

## 2019-03-28 ENCOUNTER — Encounter (HOSPITAL_COMMUNITY): Payer: Self-pay | Admitting: Emergency Medicine

## 2019-03-28 ENCOUNTER — Emergency Department (HOSPITAL_COMMUNITY)
Admission: EM | Admit: 2019-03-28 | Discharge: 2019-03-29 | Disposition: A | Discharge status: Other Acute Inpatient Hospital | Payer: Medicaid Other | Attending: Emergency Medicine | Admitting: Emergency Medicine

## 2019-03-28 DIAGNOSIS — Z1159 Encounter for screening for other viral diseases: Secondary | ICD-10-CM | POA: Insufficient documentation

## 2019-03-28 DIAGNOSIS — F17210 Nicotine dependence, cigarettes, uncomplicated: Secondary | ICD-10-CM | POA: Insufficient documentation

## 2019-03-28 DIAGNOSIS — F1520 Other stimulant dependence, uncomplicated: Secondary | ICD-10-CM | POA: Insufficient documentation

## 2019-03-28 DIAGNOSIS — Z79899 Other long term (current) drug therapy: Secondary | ICD-10-CM | POA: Insufficient documentation

## 2019-03-28 DIAGNOSIS — F1910 Other psychoactive substance abuse, uncomplicated: Secondary | ICD-10-CM

## 2019-03-28 DIAGNOSIS — F332 Major depressive disorder, recurrent severe without psychotic features: Secondary | ICD-10-CM | POA: Insufficient documentation

## 2019-03-28 DIAGNOSIS — F1210 Cannabis abuse, uncomplicated: Secondary | ICD-10-CM | POA: Insufficient documentation

## 2019-03-28 LAB — RAPID URINE DRUG SCREEN, HOSP PERFORMED
Amphetamines: POSITIVE — AB
Barbiturates: NOT DETECTED
Benzodiazepines: POSITIVE — AB
Cocaine: NOT DETECTED
Opiates: NOT DETECTED
Tetrahydrocannabinol: NOT DETECTED

## 2019-03-28 LAB — CBC
HCT: 44.6 % (ref 39.0–52.0)
Hemoglobin: 15.7 g/dL (ref 13.0–17.0)
MCH: 30.3 pg (ref 26.0–34.0)
MCHC: 35.2 g/dL (ref 30.0–36.0)
MCV: 86.1 fL (ref 80.0–100.0)
Platelets: 283 10*3/uL (ref 150–400)
RBC: 5.18 MIL/uL (ref 4.22–5.81)
RDW: 11.7 % (ref 11.5–15.5)
WBC: 12 10*3/uL — ABNORMAL HIGH (ref 4.0–10.5)
nRBC: 0 % (ref 0.0–0.2)

## 2019-03-28 LAB — ACETAMINOPHEN LEVEL: Acetaminophen (Tylenol), Serum: <10 ug/mL — ABNORMAL LOW (ref 10–30)

## 2019-03-28 LAB — COMPREHENSIVE METABOLIC PANEL
ALT: 33 U/L (ref 0–44)
AST: 51 U/L — ABNORMAL HIGH (ref 15–41)
Albumin: 4.7 g/dL (ref 3.5–5.0)
Alkaline Phosphatase: 76 U/L (ref 38–126)
Anion gap: 9 (ref 5–15)
BUN: 31 mg/dL — ABNORMAL HIGH (ref 6–20)
CO2: 22 mmol/L (ref 22–32)
Calcium: 8.7 mg/dL — ABNORMAL LOW (ref 8.9–10.3)
Chloride: 102 mmol/L (ref 98–111)
Creatinine, Ser: 1.43 mg/dL — ABNORMAL HIGH (ref 0.61–1.24)
GFR calc Af Amer: >60 mL/min (ref >60)
GFR calc non Af Amer: >60 mL/min (ref >60)
Glucose, Bld: 143 mg/dL — ABNORMAL HIGH (ref 70–99)
Potassium: 3.7 mmol/L (ref 3.5–5.1)
Sodium: 133 mmol/L — ABNORMAL LOW (ref 135–145)
Total Bilirubin: 0.6 mg/dL (ref 0.3–1.2)
Total Protein: 7.6 g/dL (ref 6.5–8.1)

## 2019-03-28 LAB — SALICYLATE LEVEL: Salicylate Lvl: <7 mg/dL (ref 2.8–30.0)

## 2019-03-28 LAB — ETHANOL: Alcohol, Ethyl (B): <10 mg/dL (ref <10)

## 2019-03-28 MED ORDER — SODIUM CHLORIDE 0.9 % IV BOLUS
1000.0000 mL | Freq: Once | INTRAVENOUS | Status: AC
  Administered 2019-03-28: 1000 mL via INTRAVENOUS

## 2019-03-28 NOTE — ED Provider Notes (Signed)
[facility]-EMERGENCY DEPT Provider Note   CSN: [id number] Arrival date & time: 03/28/19  1915    History   Chief Complaint Chief Complaint  Patient presents with  . Suicidal  . Detox    HPI John [name] is a 23 y.o. male with a hx of PTSD, anxiety, polysubstance abuse, PTSD presents to the Emergency Department complaining of gradual, persistent and progressively worsening anxiety.  Patient reports is been going on for a while and he has come to the emergency department for "help with managing his medications and getting back on the right track."  Patient reports a history of depression and anxiety but states he is not taking any of his medications at this time.  He was previously taking lithium, Abilify, Strattera and Klonopin.  Patient reports that he has been injecting and smoking methamphetamines along with injecting heroin.  He denies alcohol usage.  He states significant drug binge over the last several days.  Patient denies suicidal ideation, homicidal ideation, auditory or visual hallucinations.  Patient reports that when he came he had some vague suicidal ideation but made no plan to harm himself.  He reports now that he has been to sleep he feels much better.  Pt does report concern about a possible infection on his neck.  He admits to injecting at that site.      The history is provided by the patient and medical records. No language interpreter was used.    Past Medical History:  Diagnosis Date  . Acne nodule    on going    Patient Active Problem List   Diagnosis Date Noted  . Severe recurrent major depression without psychotic features (HCC) 11/23/2018  . Generalized anxiety disorder 11/23/2018  . PTSD (post-traumatic stress disorder) 11/23/2018  . Unspecified episodic mood disorder 08/05/2014  . Polysubstance abuse (HCC) 08/05/2014  . MDD (major depressive disorder), single episode, severe , no psychosis (HCC) 08/04/2014  . Suicidal ideation  08/04/2014  . MDD (major depressive disorder), recurrent severe, without psychosis (HCC) 08/04/2014    History reviewed. No pertinent surgical history.      Home Medications    Prior to Admission medications   Medication Sig Start Date End Date Taking? Authorizing Provider  albuterol (PROVENTIL HFA;VENTOLIN HFA) 108 (90 Base) MCG/ACT inhaler Inhale 1-2 puffs into the lungs every 6 (six) hours as needed (cough). 07/27/18   Elpidio Anis, PA-C  atomoxetine (STRATTERA) 25 MG capsule Take 25 mg by mouth every morning. 12/18/18   [provider]  gabapentin (NEURONTIN) 300 MG capsule Take 1 capsule (300 mg total) by mouth 3 (three) times daily. 11/25/18   Malvin Johns, MD  lamoTRIgine (LAMICTAL) 25 MG tablet Take 1 tablet (25 mg total) by mouth daily. For mood stabilization Patient not taking: Reported on 07/27/2018 08/07/14   Armandina Stammer I, NP  lithium carbonate 300 MG capsule Take 300 mg by mouth 2 (two) times daily. 12/18/18   [provider]  prazosin (MINIPRESS) 2 MG capsule Take 1 capsule (2 mg total) by mouth at bedtime. 11/25/18   Malvin Johns, MD    Family History Family History  Problem Relation Age of Onset  . Bipolar disorder Mother     Social History Social History   Tobacco Use  . Smoking status: Current Every Day Smoker    Packs/day: 1.00    Years: 5.00    Pack years: 5.00    Types: Cigarettes  . Smokeless tobacco: Never Used  Substance Use Topics  .  Alcohol use: Yes    Alcohol/week: 6.0 standard drinks    Types: 6 Shots of liquor per week    Comment: daily drinker  . Drug use: Yes    Types: Marijuana, Oxycodone, Methamphetamines    Comment: heroin      Allergies   Tessalon [benzonatate] and Acetaminophen   Review of Systems Review of Systems  Constitutional: Negative for appetite change, diaphoresis, fatigue, fever and unexpected weight change.  HENT: Negative for mouth sores.   Eyes: Negative for visual disturbance.  Respiratory:  Negative for cough, chest tightness, shortness of breath and wheezing.   Cardiovascular: Negative for chest pain.  Gastrointestinal: Negative for abdominal pain, constipation, diarrhea, nausea and vomiting.  Endocrine: Negative for polydipsia, polyphagia and polyuria.  Genitourinary: Negative for dysuria, frequency, hematuria and urgency.  Musculoskeletal: Negative for back pain and neck stiffness.  Skin: Positive for wound. Negative for rash.  Allergic/Immunologic: Negative for immunocompromised state.  Neurological: Negative for syncope, light-headedness and headaches.  Hematological: Does not bruise/bleed easily.  Psychiatric/Behavioral: Positive for dysphoric mood. Negative for sleep disturbance. The patient is nervous/anxious.      Physical Exam Updated Vital Signs BP 138/81 (BP Location: Left Arm)   Pulse 92   Temp 98 F (36.7 C) (Oral)   Resp 20   SpO2 98%   Physical Exam Vitals signs and nursing note reviewed.  Constitutional:      General: He is not in acute distress.    Appearance: He is well-developed.  HENT:     Head: Normocephalic.  Eyes:     General: No scleral icterus.    Conjunctiva/sclera: Conjunctivae normal.  Neck:     Musculoskeletal: Normal range of motion.     Comments: Small abscess over the left EJ at site of injection with erythema and induration; site is open and draining. Cardiovascular:     Rate and Rhythm: Normal rate and regular rhythm.     Heart sounds: No murmur.  Pulmonary:     Effort: Pulmonary effort is normal.     Breath sounds: Normal breath sounds. No wheezing or rhonchi.  Abdominal:     General: There is no distension.     Palpations: Abdomen is soft.     Tenderness: There is no abdominal tenderness. There is no guarding or rebound. Negative signs include Murphy's sign and Rovsing's sign.  Musculoskeletal: Normal range of motion.  Skin:    General: Skin is warm and dry.     Comments: No splinter hemorrhages noted under the  fingernails. Numerous sites of abscesses from IV drug injection.  Neurological:     Mental Status: He is alert.     Comments: Moves all extremities without ataxia.  Speech is clear and goal oriented.  Psychiatric:        Attention and Perception: He does not perceive auditory or visual hallucinations.        Mood and Affect: Mood is anxious and depressed.        Thought Content: Thought content does not include homicidal or suicidal ideation. Thought content does not include homicidal or suicidal plan.     Comments: He endorses depressed mood and anxiousness, but does not appear visibly anxious on clinical exam.      ED Treatments / Results  Labs (all labs ordered are listed, but only abnormal results are displayed) Labs Reviewed  COMPREHENSIVE METABOLIC PANEL - Abnormal; Notable for the following components:      Result Value   Sodium 133 (*)  Glucose, Bld 143 (*)    BUN 31 (*)    Creatinine, Ser 1.43 (*)    Calcium 8.7 (*)    AST 51 (*)    All other components within normal limits  ACETAMINOPHEN LEVEL - Abnormal; Notable for the following components:   Acetaminophen (Tylenol), Serum <10 (*)    All other components within normal limits  CBC - Abnormal; Notable for the following components:   WBC 12.0 (*)    All other components within normal limits  RAPID URINE DRUG SCREEN, HOSP PERFORMED - Abnormal; Notable for the following components:   Benzodiazepines POSITIVE (*)    Amphetamines POSITIVE (*)    All other components within normal limits  URINALYSIS, ROUTINE W REFLEX MICROSCOPIC - Abnormal; Notable for the following components:   APPearance CLOUDY (*)    Ketones, ur 5 (*)    Leukocytes,Ua TRACE (*)    All other components within normal limits  SARS CORONAVIRUS 2 (HOSPITAL ORDER, PERFORMED IN [facility])  ETHANOL  SALICYLATE LEVEL     Procedures Procedures (including critical care time)  Medications Ordered in ED Medications  doxycycline  (VIBRA-TABS) tablet 100 mg (has no administration in time range)  sodium chloride 0.9 % bolus 1,000 mL (1,000 mLs Intravenous New Bag/Given 03/28/19 2326)     Initial Impression / Assessment and Plan / ED Course  I have reviewed the triage vital signs and the nursing notes.  Pertinent labs & imaging results that were available during my care of the patient were reviewed by me and considered in my medical decision making (see chart for details).  Clinical Course as of Mar 29 323  Fri Mar 28, 2019  2326 He is positive for benzodiazepines and amphetamine.  Negative for opiates.  Benzodiazepines(!): POSITIVE [HM]  2326 Elevation in serum creatinine and BUN.  Decreased sodium and increased AST.  Suspect this is likely secondary to decreased oral intake over the last several days as patient has been on a drug binge.  Creatinine(!): 1.43 [HM]  2326 Abdomen is soft and nontender.  No elevation in ALT.  Likely to be hepatitis.  AST(!): 51 [HM]  Sat Mar 29, 2019  0039 Discussed with TTS.  They report that patient may come to the observation unit at [facility] if he receives a negative COVID screening.  This was ordered.   [HM]    Clinical Course User Index [HM] Bexley Laubach, Dahlia Client, PA-C       Patient presents with request for psychiatric help.  He denies SI, HI, AVH.  He is a long history of IV drug use.  Labs with elevated serum creatinine and BUN likely prerenal due to decreased oral intake.  Fluids given here in the emergency department.  Additionally patient with elevated AST but no elevation in ALT.  Abdomen is soft and nontender.  Patient is afebrile without tachycardia or hypotension.  No murmur is heard on cardiac exam and there are no splinter hemorrhages.  Doubt endocarditis at this time.  Patient expresses concern about a site on his neck over his left EJ where he previously injected.  He does appear to have a small abscess however it is open and draining.  Surrounding erythema is noted.  Will  give antibiotics for this.    Labs are otherwise reassuring.  He is medically stable for TTS evaluation.  At this time there is not an emergent condition which requires intervention.   Discussed with Thurston Pounds.  Patient is to be transferred to the  observation unit if he has a negative COVID test.  3:19 AM Patient's cover test is negative.  [facility].  Final Clinical Impressions(s) / ED Diagnoses   Final diagnoses:  Polysubstance abuse [facility](HCC)    ED Discharge Orders    None       Mardene SayerMuthersbaugh, Boyd KerbsHannah, PA-C 03/29/19 0324    Charlynne PanderYao, David Hsienta, MD 04/02/19 [id number]

## 2019-03-28 NOTE — Progress Notes (Signed)
Received Timouthy from the main ED, he is agitated about being here. He was allowed to make one call for 5 minutes. Then he requested something to eat, his request was granted. The sitter is at the bedside. Shortly thereafter the PA assessed him and wrote orders.  The coronavirus test was negative, report was called to the nurse at [facility] and he signed the voluntary consent. He was transported without incident at 0340 hrs.

## 2019-03-28 NOTE — ED Notes (Signed)
Patient has been wanded. 

## 2019-03-28 NOTE — ED Triage Notes (Signed)
Patient here from home with complaints of suicidal ideation with plan that "varies". Reports hx of depression and anxiety. Also reports IV drug use of heroin. Also reports meth use. States that he is suppose to take Lithium, Abilify, Strattera, and Clonopin but hasn't had meds in "months". States that he was been "couch surfing" and does not have anywhere to live.

## 2019-03-29 ENCOUNTER — Observation Stay (HOSPITAL_COMMUNITY)
Admission: AD | Admit: 2019-03-29 | Discharge: 2019-03-29 | Disposition: A | Discharge status: Home / Self Care | Payer: Self-pay | Source: Intra-hospital | Attending: Psychiatry | Admitting: Psychiatry

## 2019-03-29 ENCOUNTER — Other Ambulatory Visit: Payer: Self-pay

## 2019-03-29 ENCOUNTER — Encounter (HOSPITAL_COMMUNITY): Payer: Self-pay | Admitting: Medical

## 2019-03-29 DIAGNOSIS — Z7289 Other problems related to lifestyle: Secondary | ICD-10-CM | POA: Insufficient documentation

## 2019-03-29 DIAGNOSIS — Z886 Allergy status to analgesic agent status: Secondary | ICD-10-CM | POA: Insufficient documentation

## 2019-03-29 DIAGNOSIS — F515 Nightmare disorder: Secondary | ICD-10-CM | POA: Insufficient documentation

## 2019-03-29 DIAGNOSIS — F1190 Opioid use, unspecified, uncomplicated: Secondary | ICD-10-CM | POA: Insufficient documentation

## 2019-03-29 DIAGNOSIS — F1910 Other psychoactive substance abuse, uncomplicated: Secondary | ICD-10-CM | POA: Diagnosis present

## 2019-03-29 DIAGNOSIS — Z590 Homelessness: Secondary | ICD-10-CM | POA: Insufficient documentation

## 2019-03-29 DIAGNOSIS — Z888 Allergy status to other drugs, medicaments and biological substances status: Secondary | ICD-10-CM | POA: Insufficient documentation

## 2019-03-29 DIAGNOSIS — G4700 Insomnia, unspecified: Secondary | ICD-10-CM | POA: Insufficient documentation

## 2019-03-29 DIAGNOSIS — F1510 Other stimulant abuse, uncomplicated: Secondary | ICD-10-CM | POA: Insufficient documentation

## 2019-03-29 DIAGNOSIS — G479 Sleep disorder, unspecified: Secondary | ICD-10-CM | POA: Insufficient documentation

## 2019-03-29 DIAGNOSIS — Z7951 Long term (current) use of inhaled steroids: Secondary | ICD-10-CM | POA: Insufficient documentation

## 2019-03-29 DIAGNOSIS — F410 Panic disorder [episodic paroxysmal anxiety] without agoraphobia: Secondary | ICD-10-CM | POA: Insufficient documentation

## 2019-03-29 DIAGNOSIS — F4312 Post-traumatic stress disorder, chronic: Secondary | ICD-10-CM | POA: Diagnosis present

## 2019-03-29 DIAGNOSIS — Z818 Family history of other mental and behavioral disorders: Secondary | ICD-10-CM | POA: Insufficient documentation

## 2019-03-29 DIAGNOSIS — F17210 Nicotine dependence, cigarettes, uncomplicated: Secondary | ICD-10-CM | POA: Insufficient documentation

## 2019-03-29 DIAGNOSIS — F4310 Post-traumatic stress disorder, unspecified: Secondary | ICD-10-CM | POA: Diagnosis present

## 2019-03-29 DIAGNOSIS — R45851 Suicidal ideations: Secondary | ICD-10-CM

## 2019-03-29 DIAGNOSIS — F331 Major depressive disorder, recurrent, moderate: Principal | ICD-10-CM | POA: Insufficient documentation

## 2019-03-29 DIAGNOSIS — Z79899 Other long term (current) drug therapy: Secondary | ICD-10-CM | POA: Insufficient documentation

## 2019-03-29 DIAGNOSIS — Z1159 Encounter for screening for other viral diseases: Secondary | ICD-10-CM | POA: Insufficient documentation

## 2019-03-29 DIAGNOSIS — F1290 Cannabis use, unspecified, uncomplicated: Secondary | ICD-10-CM | POA: Insufficient documentation

## 2019-03-29 DIAGNOSIS — F332 Major depressive disorder, recurrent severe without psychotic features: Secondary | ICD-10-CM | POA: Diagnosis present

## 2019-03-29 LAB — URINALYSIS, ROUTINE W REFLEX MICROSCOPIC
Bacteria, UA: NONE SEEN
Bilirubin Urine: NEGATIVE
Glucose, UA: NEGATIVE mg/dL
Hgb urine dipstick: NEGATIVE
Ketones, ur: 5 mg/dL — AB
Nitrite: NEGATIVE
Protein, ur: NEGATIVE mg/dL
Specific Gravity, Urine: 1.029 (ref 1.005–1.030)
pH: 5 (ref 5.0–8.0)

## 2019-03-29 LAB — SARS CORONAVIRUS 2 BY RT PCR (HOSPITAL ORDER, PERFORMED IN ~~LOC~~ HOSPITAL LAB): SARS Coronavirus 2: NEGATIVE

## 2019-03-29 MED ORDER — GABAPENTIN 300 MG PO CAPS: 300.0000 mg | ORAL_CAPSULE | Freq: Three times a day (TID) | ORAL | 1 refills | Status: DC

## 2019-03-29 MED ORDER — GABAPENTIN 300 MG PO CAPS
300.0000 mg | ORAL_CAPSULE | Freq: Three times a day (TID) | ORAL | Status: DC
  Administered 2019-03-29: 300 mg via ORAL
  Filled 2019-03-29 (×2): qty 1

## 2019-03-29 MED ORDER — PRAZOSIN HCL 1 MG PO CAPS: 2.0000 mg | ORAL_CAPSULE | Freq: Every day | ORAL | Status: DC

## 2019-03-29 MED ORDER — HYDROXYZINE HCL 25 MG PO TABS: 25.0000 mg | ORAL_TABLET | Freq: Four times a day (QID) | ORAL | 0 refills | Status: DC | PRN

## 2019-03-29 MED ORDER — DOXYCYCLINE HYCLATE 100 MG PO TABS
100.0000 mg | ORAL_TABLET | Freq: Two times a day (BID) | ORAL | Status: DC
  Administered 2019-03-29: 04:00:00 100 mg via ORAL
  Filled 2019-03-29: qty 1

## 2019-03-29 MED ORDER — ALUM & MAG HYDROXIDE-SIMETH 200-200-20 MG/5ML PO SUSP: 30.0000 mL | ORAL | Status: DC | PRN

## 2019-03-29 MED ORDER — TRAZODONE HCL 50 MG PO TABS: 50.0000 mg | ORAL_TABLET | Freq: Every evening | ORAL | 0 refills | Status: DC | PRN

## 2019-03-29 MED ORDER — TRAZODONE HCL 50 MG PO TABS: 50.0000 mg | ORAL_TABLET | Freq: Every evening | ORAL | Status: DC | PRN

## 2019-03-29 MED ORDER — HYDROXYZINE HCL 25 MG PO TABS: 25.0000 mg | ORAL_TABLET | Freq: Four times a day (QID) | ORAL | Status: DC | PRN

## 2019-03-29 MED ORDER — PRAZOSIN HCL 2 MG PO CAPS: 2.0000 mg | ORAL_CAPSULE | Freq: Every day | ORAL | 1 refills | Status: DC

## 2019-03-29 MED ORDER — MAGNESIUM HYDROXIDE 400 MG/5ML PO SUSP: 30.0000 mL | Freq: Every day | ORAL | Status: DC | PRN

## 2019-03-29 MED ORDER — HYDROXYZINE HCL 25 MG PO TABS
25.0000 mg | ORAL_TABLET | ORAL | Status: DC | PRN
  Administered 2019-03-29: 25 mg via ORAL
  Filled 2019-03-29: qty 1

## 2019-03-29 MED ORDER — NICOTINE 21 MG/24HR TD PT24
21.0000 mg | MEDICATED_PATCH | Freq: Every day | TRANSDERMAL | Status: DC
  Administered 2019-03-29: 21 mg via TRANSDERMAL
  Filled 2019-03-29: qty 1
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
## 2019-03-29 NOTE — Progress Notes (Signed)
D: Pt A & O X 3. Denies SI, HI, AVH and pain at this time. D/C home as ordered. Patient took free bus system. A: D/C instructions reviewed with pt including prescriptions, medication samples and follow up appointment, compliance encouraged. All belongings from locker given to pt at time of departure. Scheduled and PRN medications given with verbal education and effects monitored. Safety checks maintained without incident till time of d/c.  R: Pt receptive to care. Compliant with medications when offered. Denies adverse drug reactions when assessed. Verbalized understanding related to d/c instructions. Signed belonging sheet in agreement with items received from locker. Ambulatory with a steady gait. Appears to be in no physical distress at time of departure.
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
## 2019-03-29 NOTE — Progress Notes (Signed)
[place] [facility] NOVEL CORONAVIRUS (COVID-19) DAILY CHECK-OFF SYMPTOMS - answer yes or no to each - every day NO YES  Have you had a fever in the past 24 hours?  . Fever (Temp > 37.80C / 100F) X   Have you had any of these symptoms in the past 24 hours? . New Cough .  Sore Throat  .  Shortness of Breath .  Difficulty Breathing .  Unexplained Body Aches   X   Have you had any one of these symptoms in the past 24 hours not related to allergies?   . Runny Nose .  Nasal Congestion .  Sneezing   X   If you have had runny nose, nasal congestion, sneezing in the past 24 hours, has it worsened?  X   EXPOSURES - check yes or no X   Have you traveled outside the state in the past 14 days?  X   Have you been in contact with someone with a confirmed diagnosis of COVID-19 or PUI in the past 14 days without wearing appropriate PPE?  X   Have you been living in the same home as a person with confirmed diagnosis of COVID-19 or a PUI (household contact)?    X   Have you been diagnosed with COVID-19?    X              What to do next: Answered NO to all: Answered YES to anything:   Proceed with unit schedule Follow the [facility] Inpatient Flowsheet.   

## 2019-03-29 NOTE — Plan of Care (Addendum)
[facility] Observation Crisis Plan  Reason for Crisis Plan:  Crisis Stabilization and Substance Abuse   Plan of Care:  Referral for Substance Abuse  Family Support:  None  Current Living Environment:  Homeless   Insurance:   Hospital Account    Name Acct ID Class Status Primary Coverage   John [name], John [name] [id number] BEHAVIORAL HEALTH OBSERVATION Open None        Guarantor Account (for Hospital Account [id number])    Name Relation to Pt Service Area Active? Acct Type   [name] Self [facility] Yes Behavioral Health   Address Phone       [address] [place], [place] [address] [phone](H)          Coverage Information (for Hospital Account [id number])    Not on file      Legal Guardian:  Pt is own guardian   Primary Care Provider:  Patient, No Pcp Per  Current Outpatient Providers:    Psychiatrist:     Counselor/Therapist:     Compliant with Medications:  No  Additional Information:   Tyrone Apple 5/16/20201:49 AM

## 2019-03-29 NOTE — [facility] Counselor (Signed)
Per Rutha Bouchard, RN pt can come to the Obs. Unit. Discussed with Dahlia Client, [place] and Randa Evens, RN.    Redmond Pulling, MS, [facility], [facility] Triage Specialist [phone]

## 2019-03-29 NOTE — Progress Notes (Signed)
John [name] is an 23 y.o. male, who presents VOL to OBS for increasing depression, anxiety, and SI ideation with no plan. Pt was irritable on admit. Pt states "Why am I here; last time I was over there (referring to [facility] INPT unit). Pt reported, he went to [facility] in February 2020 and was discharged in March. Pt reported, he was sober for 2.5 months then relapsed. Pt denies HI/AVH/Pain at this time. Per report, Pt has hx of polysubstance use. Pt reported, he has not taken his medications in a month and a half. Pt has hx of previous inpatient admissions. Skin was assessed and found to have blisters to bilateral feet, cuts/bites to bilateral arms, and acne to face. Pt searched and no contraband found, POC and unit policies explained and understanding verbalized. Consents obtained. Food and fluids offered, and fluids accepted. Pt had no additional questions or concerns. Belongings in locker #46.

## 2019-03-29 NOTE — Progress Notes (Signed)
Patient ID: John [name], male   DOB: July 31, 1996, 23 y.o.   MRN: [id number] D: Patient presents irritable, depressed, lethargic. He c/o a HA 6/10. He also is having vague SI with no plan or intent "don't want to be alive, this sucks." He denies SI/HI/AVH. He wants to try "getting off dope" and get help with his substance use. A: Provided support and encouragement. Educated on medications. R: Patient compliant with medications.

## 2019-03-29 NOTE — Progress Notes (Signed)
   03/29/19 0913  COVID-19 Daily Checkoff  Have you had a fever (temp > 37.80C/100F)  in the past 24 hours?  No  If you have had runny nose, nasal congestion, sneezing in the past 24 hours, has it worsened? No  COVID-19 EXPOSURE  Have you traveled outside the state in the past 14 days? No  Have you been in contact with someone with a confirmed diagnosis of COVID-19 or PUI in the past 14 days without wearing appropriate PPE? No  Have you been living in the same home as a person with confirmed diagnosis of COVID-19 or a PUI (household contact)? No  Have you been diagnosed with COVID-19? No

## 2019-04-13 ENCOUNTER — Other Ambulatory Visit: Payer: Self-pay

## 2019-04-13 ENCOUNTER — Emergency Department (HOSPITAL_COMMUNITY): Payer: Self-pay

## 2019-04-13 ENCOUNTER — Encounter (HOSPITAL_COMMUNITY): Payer: Self-pay | Admitting: Emergency Medicine

## 2019-04-13 ENCOUNTER — Emergency Department (HOSPITAL_COMMUNITY)
Admission: EM | Admit: 2019-04-13 | Discharge: 2019-04-13 | Disposition: A | Discharge status: Home / Self Care | Payer: Self-pay | Attending: Emergency Medicine | Admitting: Emergency Medicine

## 2019-04-13 DIAGNOSIS — M25551 Pain in right hip: Secondary | ICD-10-CM | POA: Insufficient documentation

## 2019-04-13 DIAGNOSIS — T1491XA Suicide attempt, initial encounter: Secondary | ICD-10-CM

## 2019-04-13 DIAGNOSIS — F1520 Other stimulant dependence, uncomplicated: Secondary | ICD-10-CM | POA: Insufficient documentation

## 2019-04-13 DIAGNOSIS — F1910 Other psychoactive substance abuse, uncomplicated: Secondary | ICD-10-CM

## 2019-04-13 DIAGNOSIS — F1120 Opioid dependence, uncomplicated: Secondary | ICD-10-CM | POA: Insufficient documentation

## 2019-04-13 DIAGNOSIS — F333 Major depressive disorder, recurrent, severe with psychotic symptoms: Secondary | ICD-10-CM | POA: Insufficient documentation

## 2019-04-13 DIAGNOSIS — R4689 Other symptoms and signs involving appearance and behavior: Secondary | ICD-10-CM

## 2019-04-13 DIAGNOSIS — T50902A Poisoning by unspecified drugs, medicaments and biological substances, intentional self-harm, initial encounter: Secondary | ICD-10-CM

## 2019-04-13 DIAGNOSIS — R45851 Suicidal ideations: Secondary | ICD-10-CM | POA: Insufficient documentation

## 2019-04-13 HISTORY — DX: Other psychoactive substance abuse, uncomplicated: F19.10

## 2019-04-13 HISTORY — DX: Suicide attempt, initial encounter: T14.91XA

## 2019-04-13 HISTORY — DX: Poisoning by unspecified drugs, medicaments and biological substances, intentional self-harm, initial encounter: T50.902A

## 2019-04-13 HISTORY — DX: Other symptoms and signs involving appearance and behavior: R46.89

## 2019-04-13 LAB — CBC WITH DIFFERENTIAL/PLATELET
Abs Immature Granulocytes: 0.02 10*3/uL (ref 0.00–0.07)
Basophils Absolute: 0 10*3/uL (ref 0.0–0.1)
Basophils Relative: 0 %
Eosinophils Absolute: 0.2 10*3/uL (ref 0.0–0.5)
Eosinophils Relative: 3 %
HCT: 43.2 % (ref 39.0–52.0)
Hemoglobin: 15.2 g/dL (ref 13.0–17.0)
Immature Granulocytes: 0 %
Lymphocytes Relative: 21 %
Lymphs Abs: 1.5 10*3/uL (ref 0.7–4.0)
MCH: 29.2 pg (ref 26.0–34.0)
MCHC: 35.2 g/dL (ref 30.0–36.0)
MCV: 83.1 fL (ref 80.0–100.0)
Monocytes Absolute: 1.1 10*3/uL — ABNORMAL HIGH (ref 0.1–1.0)
Monocytes Relative: 15 %
Neutro Abs: 4.3 10*3/uL (ref 1.7–7.7)
Neutrophils Relative %: 61 %
Platelets: 278 10*3/uL (ref 150–400)
RBC: 5.2 MIL/uL (ref 4.22–5.81)
RDW: 11.8 % (ref 11.5–15.5)
WBC: 7.1 10*3/uL (ref 4.0–10.5)
nRBC: 0 % (ref 0.0–0.2)

## 2019-04-13 LAB — COMPREHENSIVE METABOLIC PANEL
ALT: 104 U/L — ABNORMAL HIGH (ref 0–44)
AST: 111 U/L — ABNORMAL HIGH (ref 15–41)
Albumin: 3.8 g/dL (ref 3.5–5.0)
Alkaline Phosphatase: 82 U/L (ref 38–126)
Anion gap: 11 (ref 5–15)
BUN: 28 mg/dL — ABNORMAL HIGH (ref 6–20)
CO2: 24 mmol/L (ref 22–32)
Calcium: 8.9 mg/dL (ref 8.9–10.3)
Chloride: 98 mmol/L (ref 98–111)
Creatinine, Ser: 1.28 mg/dL — ABNORMAL HIGH (ref 0.61–1.24)
GFR calc Af Amer: >60 mL/min (ref >60)
GFR calc non Af Amer: >60 mL/min (ref >60)
Glucose, Bld: 88 mg/dL (ref 70–99)
Potassium: 3.6 mmol/L (ref 3.5–5.1)
Sodium: 133 mmol/L — ABNORMAL LOW (ref 135–145)
Total Bilirubin: 0.9 mg/dL (ref 0.3–1.2)
Total Protein: 6.5 g/dL (ref 6.5–8.1)

## 2019-04-13 LAB — ETHANOL: Alcohol, Ethyl (B): <10 mg/dL (ref <10)

## 2019-04-13 MED ORDER — HYDROCODONE-ACETAMINOPHEN 5-325 MG PO TABS
1.0000 | ORAL_TABLET | Freq: Once | ORAL | Status: AC
  Administered 2019-04-13: 1 via ORAL
  Filled 2019-04-13: qty 1

## 2019-04-13 MED ORDER — DOXYCYCLINE HYCLATE 100 MG PO TABS
100.0000 mg | ORAL_TABLET | Freq: Once | ORAL | Status: AC
  Administered 2019-04-13: 100 mg via ORAL
  Filled 2019-04-13: qty 1

## 2019-04-13 MED ORDER — CEPHALEXIN 250 MG PO CAPS
500.0000 mg | ORAL_CAPSULE | Freq: Once | ORAL | Status: AC
  Administered 2019-04-13: 500 mg via ORAL
  Filled 2019-04-13: qty 2

## 2019-04-13 MED ORDER — CEPHALEXIN 500 MG PO CAPS: 500.0000 mg | ORAL_CAPSULE | Freq: Two times a day (BID) | ORAL | 0 refills | Status: DC

## 2019-04-13 MED ORDER — DOXYCYCLINE HYCLATE 100 MG PO CAPS: 100.0000 mg | ORAL_CAPSULE | Freq: Two times a day (BID) | ORAL | 0 refills | Status: DC

## 2019-04-13 MED ORDER — CLONAZEPAM 0.5 MG PO TABS
1.0000 mg | ORAL_TABLET | Freq: Once | ORAL | Status: AC
  Administered 2019-04-13: 1 mg via ORAL
  Filled 2019-04-13: qty 2

## 2019-04-13 MED ORDER — NAPROXEN 250 MG PO TABS
500.0000 mg | ORAL_TABLET | Freq: Once | ORAL | Status: AC
  Administered 2019-04-13: 500 mg via ORAL
  Filled 2019-04-13: qty 2

## 2019-04-13 NOTE — ED Notes (Signed)
Pt has been informed of needing a urine sample. Pt says he will give one soon. Bedside commode provided for BM needs.

## 2019-04-13 NOTE — Discharge Instructions (Signed)
Return here as needed. Follow up with your doctor. °

## 2019-04-13 NOTE — ED Provider Notes (Signed)
Psychiatry clears the patient. I reassessed the patient.  He was brought into the ER by police after he was arrested with crystal meth.  Once he was picked up, patient started complaining of SI.  He does have history of homelessness, polysubstance abuse and prior suicide ideations.  He was just discharged to the hospital on 5/16.  Reports that he did not feel like he got adequate help when he was last time admitted to the hospital.  He continues to use drugs.  He thinks that if he is discharged he might overdose and kill himself.   Patient will be taken to the jail from the ER.  On my evaluation he is noted to have erythema over his right thigh proximally and also left antecubital region.  There is no fluctuance appreciated at this point.  He reports his symptoms have been present for 2 days.  Questionable developing abscess -but he has no fevers, no white count no clear evidence of abscess at this time.  Given his IV use he is at risk of phlebitis.  Plan is to discharge patient with doxycycline and Keflex.  He is going to be discharged to police.  He has been advised to utilize the resources been provided on help with substance abuse.  Patient is understanding of this plan once we sat down and discussed the work-up completed in the ER and also the assessment that was completed by behavioral health.   Derwood Kaplan, MD 04/13/19 [phone]

## 2019-04-13 NOTE — ED Triage Notes (Signed)
Pt brought in by [employer] after possession of Meth, running from police and now complains of R hip pain and SI. Endorses Meth and ETOH use last night

## 2019-04-13 NOTE — ED Notes (Signed)
Pt very irate about his situation.  [employer] at bedside. Pt aware he will be going to jail upon discharge. After speaking with pt about plan of getting XR he is now calm.

## 2019-04-13 NOTE — ED Notes (Signed)
Pt wanded °

## 2019-04-13 NOTE — ED Notes (Signed)
Pt being discharged into [employer] custody. Pt was given clothes and valuables from security. Pt signed paperwork acknoledging he did receive. Discharge paperwork including prescriptions given to [employer] who verbalized understanding.   Pt dressed and escorted out by [employer].

## 2019-04-13 NOTE — ED Notes (Signed)
Food and drink available for pt at bedside. Pt sleeping.

## 2019-04-13 NOTE — BH Assessment (Addendum)
Tele Assessment Note   Patient Name: John [name] MRN: [id number] Referring Physician: PA Ebbie Ridge Location of Patient: [facility] ED Location of Provider: Behavioral Health TTS Department  John [name] is an 23 y.o. male.  The pt came in after he was arrested for possession of crystal meth.  He is saying he is suicidal and denies a plan.  He stated he hates himself.  The pt reported he has tried to hang himself and cut himself.  According to previous assessments the pt has a history of cutting.  He isn't currently seeing a counselor or psychiatrist.  The pt has had several hospitalizations.  His most recent was 12/2018 at [facility] and he was at [facility] 11/2018.  The pt is currently homeless.  He denies HI.  The pt stated he is hearing his deceased mother's voice.  According to previous records, the pt has a history of abuse.  He stated he isn't sleeping or eating well.  The pt reported he last used crystal meth and heroin last night.  He stated he last used alcohol this morning, but his BAL was 0.  His UDS hasn't been completed at this time.  Pt is dressed in scrubs. He is alert.  When asked his name the pt initially said his name is "John [name]".  The pt then said he didn't know and then he gave his name Pt speaks in a clear tone, at a loud volume and normal pace. Eye contact is good. Pt's mood is angry.  The pt yelled for most of the assessment and appeared to get more irritated with each question.  Pt was not cooperative throughout assessment.   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 Acne nodule    on going  . Aggressive behavior 04/13/2019  . OD (overdose of drug), intentional self-harm, initial encounter (HCC) 04/13/2019  . Substance abuse (HCC) 04/13/2019  . Suicide attempt (HCC)  04/13/2019    History reviewed. No pertinent surgical history.  Family History:  Family History  Problem Relation Age of Onset  . Bipolar disorder Mother     Social History:  reports that he has been smoking cigarettes. He has a 5.00 pack-year smoking history. He has never used smokeless tobacco. He reports current alcohol use of about 6.0 standard drinks of alcohol per week. He reports current drug use. Drugs: Marijuana, Oxycodone, and Methamphetamines.  Additional Social History:  Alcohol / Drug Use Pain Medications: See MAR Prescriptions: See MAR Over the Counter: See MAR History of alcohol / drug use?: Yes Longest period of sobriety (when/how long): unknown Substance #1 Name of Substance 1: Crystal Meth 1 - Age of First Use: UTA 1 - Amount (size/oz): UTA 1 - Frequency: UTA 1 - Duration: UTA 1 - Last Use / Amount: 04/12/2019 Substance #2 Name of Substance 2: Alcohol  2 - Age of First Use: UTA 2 - Amount (size/oz): UTA 2 - Frequency: UTA 2 - Duration: UTA 2 - Last Use / Amount: 04/13/2019 per the pt's report, but the pt's BAL is 0 Substance #3 Name of Substance 3: heroin 3 - Age of First Use: UTA 3 - Amount (size/oz): UTA 3 - Frequency: UTA 3 - Duration: UTA 3 - Last Use / Amount: 04/12/2019 Substance #4 Name of Substance 4: marijuana 4 - Age of First Use: UTA 4 - Amount (  size/oz): UTA 4 - Frequency: UTA 4 - Duration: UTA 4 - Last Use / Amount: UTA  CIWA: CIWA-Ar BP: 112/85 Pulse Rate: 72 Nausea and Vomiting: no nausea and no vomiting Tactile Disturbances: none Tremor: no tremor Auditory Disturbances: very mild harshness or ability to frighten Paroxysmal Sweats: barely perceptible sweating, palms moist Visual Disturbances: not present Anxiety: moderately anxious, or guarded, so anxiety is inferred Headache, Fullness in Head: none present Agitation: somewhat more than normal activity Orientation and Clouding of Sensorium: oriented and can do serial  additions CIWA-Ar Total: 7 COWS:    Allergies:  Allergies  Allergen Reactions  . Tessalon [Benzonatate] Other (See Comments)    Chest pain   . Acetaminophen Rash    Home Medications: (Not in a hospital admission)   OB/GYN Status:  No LMP for male patient.  General Assessment Data Location of Assessment: [facility] ED TTS Assessment: In system Is this a Tele or Face-to-Face Assessment?: Tele Assessment Is this an Initial Assessment or a Re-assessment for this encounter?: Initial Assessment Patient Accompanied by:: N/A Language Other than English: No Living Arrangements: Homeless/Shelter What gender do you identify as?: Male Marital status: Single Living Arrangements: Other (Comment)(homeless) Can pt return to current living arrangement?: Yes Admission Status: Voluntary Is patient capable of signing voluntary admission?: Yes Referral Source: Other(police) Insurance type: Self Pay     Crisis Care Plan Living Arrangements: Other (Comment)(homeless) Legal Guardian: Other:(Self) Name of Psychiatrist: none Name of Therapist: none  Education Status Is patient currently in school?: No Is the patient employed, unemployed or receiving disability?: Unemployed  Risk to self with the past 6 months Suicidal Ideation: Yes-Currently Present Has patient been a risk to self within the past 6 months prior to admission? : Yes Suicidal Intent: Yes-Currently Present Has patient had any suicidal intent within the past 6 months prior to admission? : Yes Is patient at risk for suicide?: Yes Suicidal Plan?: No Has patient had any suicidal plan within the past 6 months prior to admission? : No Specify Current Suicidal Plan: pt denies having a plan Access to Means: No What has been your use of drugs/alcohol within the last 12 months?: crystal meth, heroin, pain pills, alcohol and marijuana Previous Attempts/Gestures: Yes How many times?: 2 Other Self Harm Risks: pt denies Triggers for Past  Attempts: Unpredictable Intentional Self Injurious Behavior: None Family Suicide History: Unknown Recent stressful life event(s): Legal Issues Persecutory voices/beliefs?: No Depression: Yes Depression Symptoms: Despondent, Insomnia, Feeling angry/irritable Substance abuse history and/or treatment for substance abuse?: Yes Suicide prevention information given to non-admitted patients: Not applicable  Risk to Others within the past 6 months Homicidal Ideation: No Does patient have any lifetime risk of violence toward others beyond the six months prior to admission? : No Thoughts of Harm to Others: No Current Homicidal Intent: No Current Homicidal Plan: No Access to Homicidal Means: No Identified Victim: pt denies History of harm to others?: No Assessment of Violence: None Noted Violent Behavior Description: pt denies Does patient have access to weapons?: No Criminal Charges Pending?: Yes Describe Pending Criminal Charges: possesion of crystal meth Does patient have a court date: No Is patient on probation?: Unknown  Psychosis Hallucinations: Auditory(pt hears his deceased mother's voice) Delusions: None noted  Mental Status Report Appearance/Hygiene: Unremarkable, In scrubs Eye Contact: Good Motor Activity: Freedom of movement, Unremarkable Speech: Aggressive, Abusive, Loud Level of Consciousness: Irritable Mood: Irritable Affect: Angry Anxiety Level: None Thought Processes: Coherent, Relevant Judgement: Impaired Orientation: Person, Place, Time, Situation Obsessive Compulsive Thoughts/Behaviors:  None  Cognitive Functioning Concentration: Normal Memory: Recent Intact, Remote Intact Is patient IDD: No Insight: Poor Impulse Control: Poor Appetite: Poor Have you had any weight changes? : No Change Sleep: Decreased Total Hours of Sleep: 2 Vegetative Symptoms: None  ADLScreening [facility] Assessment Services) Patient's cognitive ability adequate to safely complete daily  activities?: Yes Patient able to express need for assistance with ADLs?: Yes Independently performs ADLs?: Yes (appropriate for developmental age)  Prior Inpatient Therapy Prior Inpatient Therapy: Yes Prior Therapy Dates: 11/2018, 12/2018, 2015 Prior Therapy Facilty/Provider(s): [facility], [facility] Reason for Treatment: Depression, anxiety, substance use.   Prior Outpatient Therapy Prior Outpatient Therapy: No Does patient have an ACCT team?: No Does patient have Intensive In-House Services?  : No Does patient have [facility] services? : No Does patient have P4CC services?: No  ADL Screening (condition at time of admission) Patient's cognitive ability adequate to safely complete daily activities?: Yes Patient able to express need for assistance with ADLs?: Yes Independently performs ADLs?: Yes (appropriate for developmental age)       Abuse/Neglect Assessment (Assessment to be complete while patient is alone) Abuse/Neglect Assessment Can Be Completed: Unable to assess, patient is non-responsive or altered mental status Values / Beliefs Cultural Requests During Hospitalization: None Spiritual Requests During Hospitalization: None Consults Spiritual Care Consult Needed: No Social Work Consult Needed: No Merchant navy officer (For Healthcare) Does Patient Have a Medical Advance Directive?: No Would patient like information on creating a medical advance directive?: No - Patient declined          Disposition:  Disposition Initial Assessment Completed for this Encounter: Yes   NP Elta Guadeloupe recommends the pt be discharged.  RN Marisue Ivan and PA Thayer Ohm were made aware of the recommendations.  This service was provided via telemedicine using a 2-way, interactive audio and video technology.  Names of all persons participating in this telemedicine service and their role in this encounter. Name: John [name] Role: Pt  Name:  Role:   Name:  Role:   Name:  Role:     Ottis Stain 04/13/2019 10:37 AM

## 2019-04-13 NOTE — ED Notes (Signed)
Inventoried pt belongings and placed in locker #8.  Pt has envelope with security. [employer] at bedside. Pt has charges pending with [employer] (non-violent). Sitter order has been placed.

## 2019-05-05 NOTE — ED Provider Notes (Signed)
[facility] EMERGENCY DEPARTMENT Provider Note   CSN: [id number] Arrival date & time: 04/13/19  0747     History   Chief Complaint Chief Complaint  Patient presents with  . Suicidal  . Hip Pain    HPI John [name] is a 23 y.o. male.     HPI Patient presents to the emergency department with right hip pain and stating that he is suicidal.  The patient was being chased by the police and found in possession of drugs.  The patient states that he did use methamphetamines along with alcohol last night.  Patient states that nothing seems to make the condition better or worse.  Patient states that he has attempted suicide in the past.  Patient is very irritated and not very cooperative.  Patient would not give me any specifics about his drug use or his suicidal ideations. Past Medical History:  Diagnosis Date  . Acne nodule    on going  . Aggressive behavior 04/13/2019  . OD (overdose of drug), intentional self-harm, initial encounter ([place]) 04/13/2019  . Substance abuse ([place]) 04/13/2019  . Suicide attempt ([place]) 04/13/2019    Patient Active Problem List   Diagnosis Date Noted  . Chronic post-traumatic stress disorder (PTSD) 03/29/2019  . Major depressive disorder, recurrent episode, moderate ([place]) 03/29/2019  . Generalized anxiety disorder 11/23/2018  . Unspecified episodic mood disorder 08/05/2014  . Polysubstance abuse ([place]) 08/05/2014  . Suicidal ideation 08/04/2014    History reviewed. No pertinent surgical history.      Home Medications    Prior to Admission medications   Medication Sig Start Date End Date Taking? Authorizing Provider  amphetamine-dextroamphetamine (ADDERALL) 10 MG tablet Take 20 mg by mouth 2 (two) times a day.   Yes [provider]  clonazePAM (KLONOPIN) 1 MG tablet Take 1 mg by mouth 2 (two) times daily.   Yes [provider]  gabapentin (NEURONTIN) 300 MG capsule Take 1 capsule (300 mg total) by mouth 3  (three) times daily. 03/29/19  Yes Patrecia Pour, NP  Multiple Vitamin (MULTIVITAMIN WITH MINERALS) TABS tablet Take 1 tablet by mouth daily.   Yes [provider]  cephALEXin (KEFLEX) 500 MG capsule Take 1 capsule (500 mg total) by mouth 2 (two) times daily. 04/13/19   Mark Hassey, Harrell Gave, PA-C  doxycycline (VIBRAMYCIN) 100 MG capsule Take 1 capsule (100 mg total) by mouth 2 (two) times daily. 04/13/19   Nazanin Kinner, Harrell Gave, PA-C  hydrOXYzine (ATARAX/VISTARIL) 25 MG tablet Take 1 tablet (25 mg total) by mouth every 6 (six) hours as needed for anxiety. Patient not taking: Reported on 04/13/2019 03/29/19   Patrecia Pour, NP  prazosin (MINIPRESS) 2 MG capsule Take 1 capsule (2 mg total) by mouth at bedtime. Patient not taking: Reported on 04/13/2019 03/29/19   Patrecia Pour, NP  traZODone (DESYREL) 50 MG tablet Take 1 tablet (50 mg total) by mouth at bedtime and may repeat dose one time if needed. Patient not taking: Reported on 04/13/2019 03/29/19   Patrecia Pour, NP    Family History Family History  Problem Relation Age of Onset  . Bipolar disorder Mother     Social History Social History   Tobacco Use  . Smoking status: Current Every Day Smoker    Packs/day: 1.00    Years: 5.00    Pack years: 5.00    Types: Cigarettes  . Smokeless tobacco: Never Used  Substance Use Topics  . Alcohol use: Yes    Alcohol/week: 6.0  standard drinks    Types: 6 Shots of liquor per week    Comment: daily drinker  . Drug use: Yes    Types: Marijuana, Oxycodone, Methamphetamines    Comment: heroin      Allergies   Tessalon [benzonatate] and Acetaminophen   Review of Systems Review of Systems Level 5 caveat applies due to uncooperativeness.  Physical Exam Updated Vital Signs BP 135/80 (BP Location: Right Arm)   Pulse 80   Temp (!) 97.2 F (36.2 C) (Oral)   Resp 20   Wt 78.5 kg   SpO2 100%   BMI 22.22 kg/m   Physical Exam Vitals signs and nursing note reviewed.   Constitutional:      General: He is not in acute distress.    Appearance: He is well-developed.  HENT:     Head: Normocephalic and atraumatic.  Eyes:     Pupils: Pupils are equal, round, and reactive to light.  Neck:     Musculoskeletal: Normal range of motion and neck supple.  Cardiovascular:     Rate and Rhythm: Normal rate and regular rhythm.     Heart sounds: Normal heart sounds. No murmur. No friction rub. No gallop.   Pulmonary:     Effort: Pulmonary effort is normal. No respiratory distress.     Breath sounds: Normal breath sounds. No wheezing.  Skin:    General: Skin is warm and dry.     Capillary Refill: Capillary refill takes less than 2 seconds.     Findings: No erythema or rash.  Neurological:     Mental Status: He is alert and oriented to person, place, and time.     Motor: No abnormal muscle tone.     Coordination: Coordination normal.  Psychiatric:        Mood and Affect: Affect is angry.        Behavior: Behavior is uncooperative, agitated and aggressive.        Thought Content: Thought content does not include homicidal ideation. Thought content does not include homicidal or suicidal plan.      ED Treatments / Results  Labs (all labs ordered are listed, but only abnormal results are displayed) Labs Reviewed  COMPREHENSIVE METABOLIC PANEL - Abnormal; Notable for the following components:      Result Value   Sodium 133 (*)    BUN 28 (*)    Creatinine, Ser 1.28 (*)    AST 111 (*)    ALT 104 (*)    All other components within normal limits  CBC WITH DIFFERENTIAL/PLATELET - Abnormal; Notable for the following components:   Monocytes Absolute 1.1 (*)    All other components within normal limits  ETHANOL    EKG EKG Interpretation  Date/Time:  Sunday Apr 13 2019 08:12:20 EDT Ventricular Rate:  78 PR Interval:    QRS Duration: 99 QT Interval:  388 QTC Calculation: 442 R Axis:   88 Text Interpretation:  Sinus rhythm Borderline repolarization  abnormality No acute changes Nonspecific ST and T wave abnormality Confirmed by Derwood KaplanNanavati, Ankit [phone]([id number] on 04/13/2019 12:01:23 PM   Radiology No results found.  Procedures Procedures (including critical care time)  Medications Ordered in ED Medications  doxycycline (VIBRA-TABS) tablet 100 mg (100 mg Oral Given 04/13/19 1537)  cephALEXin (KEFLEX) capsule 500 mg (500 mg Oral Given 04/13/19 1538)  clonazePAM (KLONOPIN) tablet 1 mg (1 mg Oral Given 04/13/19 1538)  naproxen (NAPROSYN) tablet 500 mg (500 mg Oral Given 04/13/19 1539)  HYDROcodone-acetaminophen (NORCO/VICODIN) 5-325 MG  per tablet 1 tablet (1 tablet Oral Given 04/13/19 1539)     Initial Impression / Assessment and Plan / ED Course  I have reviewed the triage vital signs and the nursing notes.  Pertinent labs & imaging results that were available during my care of the patient were reviewed by me and considered in my medical decision making (see chart for details).        Patient was assessed by TTS and will be discharged into police custody.  The patient is up with this time.  Final Clinical Impressions(s) / ED Diagnoses   Final diagnoses:  Pain of right hip joint  Polysubstance abuse [facility](HCC)    ED Discharge Orders         Ordered    doxycycline (VIBRAMYCIN) 100 MG capsule  2 times daily     04/13/19 1543    cephALEXin (KEFLEX) 500 MG capsule  2 times daily     04/13/19 1543           Charlestine NightLawyer, Becky Berberian, PA-C 05/05/19 [id number]    Derwood KaplanNanavati, Ankit, MD 05/06/19 1331

## 2019-09-01 ENCOUNTER — Encounter (HOSPITAL_COMMUNITY): Payer: Self-pay | Admitting: Clinical

## 2019-09-01 ENCOUNTER — Ambulatory Visit (HOSPITAL_COMMUNITY)
Admission: RE | Admit: 2019-09-01 | Discharge: 2019-09-01 | Disposition: A | Discharge status: Home / Self Care | Payer: Self-pay | Attending: Psychiatry | Admitting: Psychiatry

## 2019-09-01 ENCOUNTER — Emergency Department (HOSPITAL_COMMUNITY)
Admission: EM | Admit: 2019-09-01 | Discharge: 2019-09-01 | Discharge status: Absent without leave | Payer: Medicaid Other

## 2019-09-01 DIAGNOSIS — F329 Major depressive disorder, single episode, unspecified: Secondary | ICD-10-CM | POA: Insufficient documentation

## 2019-09-01 NOTE — BH Assessment (Signed)
Assessment Note  John [name] is an 23 y.o. male presenting voluntarily to [facility] for assessment. Patient appears irritable and provides limited history. Patient reports he recently relapsed on methamphetamine after 4 months of sobriety. He was staying with his sister and he reports that she kicked him out last night due to his substance use. Patient states "I'm suicidal and I have a plan. I'll tell you right now if you let me leave I'm going to kill himself." Patient denies HI/AVH. Patient reports a plan to hang himself and states he previously attempted by overdosing. Patient reports he has struggled with methamphetamine, heroin, and alcohol use since he was a teenager. He states that his mother died of a heroin overdose 4 years ago. He denies having any criminal charges. When patient told he was going to have to be sent for medical clearance he became more agitated.   Patient is alert and oriented x 4. He is dressed appropriately. Patient's speech is pressured, eye contact is good, and thoughts are organized. Patient's mood is irritable and his affect is congruent. Patient's insight, judgement, and impulse control are impaired. He does not appear to be responding to internal stimuli or experiencing delusional thought content.  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 Acne nodule    on going  . Aggressive behavior 04/13/2019  . OD (overdose of drug), intentional self-harm, initial encounter (HCC) 04/13/2019  . Substance abuse (HCC) 04/13/2019  . Suicide attempt (HCC) 04/13/2019    History reviewed. No pertinent surgical history.  Family History:  Family History  Problem Relation Age of Onset  . Bipolar disorder Mother     Social History:  reports that he has been smoking cigarettes. He has a 5.00 pack-year smoking history. He has never used  smokeless tobacco. He reports current alcohol use of about 6.0 standard drinks of alcohol per week. He reports current drug use. Drugs: Marijuana, Oxycodone, and Methamphetamines.  Additional Social History:  Alcohol / Drug Use Pain Medications: see MAR Prescriptions: see MAR Over the Counter: see MAR History of alcohol / drug use?: Yes Substance #1 Name of Substance 1: Methamphetamine 1 - Age of First Use: 22 1 - Amount (size/oz): varies 1 - Frequency: daily 1 - Duration: 6 months 1 - Last Use / Amount: 08/31/2019  CIWA: CIWA-Ar BP: (!) 129/110 Pulse Rate: 74 COWS:    Allergies:  Allergies  Allergen Reactions  . Tessalon [Benzonatate] Other (See Comments)    Chest pain   . Acetaminophen Rash    Home Medications: (Not in a hospital admission)   OB/GYN Status:  No LMP for male patient.  General Assessment Data Location of Assessment: [facility] Assessment Services TTS Assessment: In system Is this a Tele or Face-to-Face Assessment?: Face-to-Face Is this an Initial Assessment or a Re-assessment for this encounter?: Initial Assessment Patient Accompanied by:: N/A Language Other than English: No Living Arrangements: Homeless/Shelter What gender do you identify as?: Male Marital status: Single Maiden name: Bradly Pregnancy Status: No Living Arrangements: Other (Comment) Can pt return to current living arrangement?: Yes Admission Status: Voluntary Is patient capable of signing voluntary admission?: Yes Referral Source: Self/Family/Friend Insurance type: None     Crisis Care Plan Living Arrangements: Other (Comment) Legal Guardian: (self) Name of Psychiatrist: none Name of Therapist: none  Education Status Is patient currently in school?: No Is the patient  employed, unemployed or receiving disability?: Unemployed  Risk to self with the past 6 months Suicidal Ideation: Yes-Currently Present Has patient been a risk to self within the past 6 months prior to  admission? : Yes Suicidal Intent: Yes-Currently Present Has patient had any suicidal intent within the past 6 months prior to admission? : Yes Is patient at risk for suicide?: Yes Suicidal Plan?: Yes-Currently Present Has patient had any suicidal plan within the past 6 months prior to admission? : Yes Specify Current Suicidal Plan: hanging or slitting wrists Access to Means: Yes Specify Access to Suicidal Means: access to drugs and alcohol What has been your use of drugs/alcohol within the last 12 months?: methamphetamine Previous Attempts/Gestures: Yes How many times?: 1 Other Self Harm Risks: none noted Triggers for Past Attempts: None known Intentional Self Injurious Behavior: Cutting Comment - Self Injurious Behavior: history of cutting Family Suicide History: No Recent stressful life event(s): Trauma (Comment), Conflict (Comment)(mother died of a heroin overdose) Persecutory voices/beliefs?: No Depression: Yes Depression Symptoms: Despondent, Insomnia, Tearfulness, Isolating, Fatigue, Guilt, Loss of interest in usual pleasures, Feeling worthless/self pity, Feeling angry/irritable Substance abuse history and/or treatment for substance abuse?: Yes Suicide prevention information given to non-admitted patients: Not applicable  Risk to Others within the past 6 months Homicidal Ideation: No Does patient have any lifetime risk of violence toward others beyond the six months prior to admission? : No Thoughts of Harm to Others: No Current Homicidal Intent: No Current Homicidal Plan: No Access to Homicidal Means: No Identified Victim: none History of harm to others?: No Assessment of Violence: None Noted Violent Behavior Description: none noted Does patient have access to weapons?: No Criminal Charges Pending?: No Does patient have a court date: No Is patient on probation?: No  Psychosis Hallucinations: None noted Delusions: None noted  Mental Status Report Appearance/Hygiene:  Unremarkable Eye Contact: Good Motor Activity: Unremarkable Speech: Logical/coherent Level of Consciousness: Alert, Irritable Mood: Irritable Affect: Irritable Anxiety Level: Severe Thought Processes: Circumstantial Judgement: Impaired Orientation: Person, Time, Place, Situation Obsessive Compulsive Thoughts/Behaviors: None  Cognitive Functioning Concentration: Normal Memory: Recent Intact, Remote Intact Is patient IDD: No Insight: Poor Impulse Control: Poor Appetite: Poor Have you had any weight changes? : No Change Sleep: Decreased Total Hours of Sleep: 0 Vegetative Symptoms: None  ADLScreening [facility] Assessment Services) Patient's cognitive ability adequate to safely complete daily activities?: Yes Patient able to express need for assistance with ADLs?: Yes Independently performs ADLs?: Yes (appropriate for developmental age)  Prior Inpatient Therapy Prior Inpatient Therapy: Yes Prior Therapy Dates: 2020, 2019, 2018 Prior Therapy Facilty/Provider(s): [facility] [place], [facility], [place] Reason for Treatment: mental health and substance use  Prior Outpatient Therapy Prior Outpatient Therapy: Yes Prior Therapy Dates: 2019 Prior Therapy Facilty/Provider(s): [facility] Reason for Treatment: med mangement Does patient have an ACCT team?: No Does patient have Intensive In-House Services?  : No Does patient have [facility] services? : No Does patient have P4CC services?: No  ADL Screening (condition at time of admission) Patient's cognitive ability adequate to safely complete daily activities?: Yes Is the patient deaf or have difficulty hearing?: No Does the patient have difficulty seeing, even when wearing glasses/contacts?: No Does the patient have difficulty concentrating, remembering, or making decisions?: No Patient able to express need for assistance with ADLs?: Yes Does the patient have difficulty dressing or bathing?: No Independently performs ADLs?: Yes (appropriate for developmental  age) Does the patient have difficulty walking or climbing stairs?: No Weakness of Legs: None Weakness of Arms/Hands: None  Therapy Consults (therapy consults require a physician order) PT Evaluation Needed: No OT Evalulation Needed: No SLP Evaluation Needed: No Abuse/Neglect Assessment (Assessment to be complete while patient is alone) Abuse/Neglect Assessment Can Be Completed: Yes Physical Abuse: Denies Verbal Abuse: Denies Sexual Abuse: Denies Exploitation of patient/patient's resources: Denies Self-Neglect: Denies Values / Beliefs Cultural Requests During Hospitalization: None Spiritual Requests During Hospitalization: None Consults Spiritual Care Consult Needed: No Social Work Consult Needed: No Merchant navy officerAdvance Directives (For Healthcare) Does Patient Have a Medical Advance Directive?: No Would patient like information on creating a medical advance directive?: No - Patient declined          Disposition: John Rankin, NP recommends in patient. [facility]. John HaltMorgan, RN informed. Disposition Initial Assessment Completed for this Encounter: Yes Dis[facility] of Patient[facility] Patient refused recommended treatment: No  On Site Evaluation by:   Reviewed with Physician:    Celedonio MiyamotoMeredith  Davielle [name] 09/01/2019 6:57 PM

## 2019-09-01 NOTE — H&P (Signed)
Behavioral Health Medical Screening Exam  John [name] is an 23 y.o. male patient presents to [facility] as walk in with complaints of suicidal ideation and a plan.  "I;m depressed and I don't have nothing else to live for.  I do have a plan and if I leave this hospital tonight I am going to kill myself.,"  Patient unable to contract for safety.  Prior diagnosis of bipolar.    Total Time spent with patient: 30 minutes  Psychiatric Specialty Exam: Physical Exam  Constitutional: He is oriented to person, place, and time. He appears well-nourished.  Neck: Normal range of motion.  Respiratory: Effort normal.  Musculoskeletal: Normal range of motion.  Neurological: He is alert and oriented to person, place, and time.  Skin: Skin is warm and dry.    Review of Systems  Psychiatric/Behavioral: Positive for depression, substance abuse and suicidal ideas. The patient is nervous/anxious and has insomnia.   All other systems reviewed and are negative.   There were no vitals taken for this visit.There is no height or weight on file to calculate BMI.  General Appearance: Casual  Eye Contact:  Good  Speech:  Clear and Coherent and Normal Rate  Volume:  Normal  Mood:  Depressed and Hopeless  Affect:  Depressed and Flat  Thought Process:  Coherent  Orientation:  Full (Time, Place, and Person)  Thought Content:  WDL  Suicidal Thoughts:  Yes.  with intent/plan  Homicidal Thoughts:  No  Memory:  Immediate;   Good Recent;   Good  Judgement:  Poor  Insight:  Lacking and Shallow  Psychomotor Activity:  Normal  Concentration: Concentration: Good and Attention Span: Good  Recall:  Good  Fund of Knowledge:Good  Language: Good  Akathisia:  No  Handed:  Right  AIMS (if indicated):     Assets:  Communication Skills Desire for Improvement Housing Social Support  Sleep:       Musculoskeletal: Strength & Muscle Tone: within normal limits Gait & Station: normal Patient leans: N/A  There were no  vitals taken for this visit.  Recommendations:  Inpatient psychiatric treatment.  No bed available at [facility]; OBS unit on divergent.  Patient to be sent to [facility] for medical clearance.  Spoke to [place], Therapist, sports (Camera operator) informed of patient transfer to [facility]  Based on my evaluation the patient does not appear to have an emergency medical condition.  Jacquetta Polhamus, NP 09/01/2019, 6:30 PM

## 2020-01-12 ENCOUNTER — Other Ambulatory Visit: Payer: Self-pay

## 2020-01-12 ENCOUNTER — Emergency Department (HOSPITAL_COMMUNITY): Payer: Medicaid Other

## 2020-01-12 ENCOUNTER — Encounter (HOSPITAL_COMMUNITY): Payer: Self-pay | Admitting: *Deleted

## 2020-01-12 ENCOUNTER — Emergency Department (HOSPITAL_COMMUNITY)
Admission: EM | Admit: 2020-01-12 | Discharge: 2020-01-12 | Disposition: A | Discharge status: Home / Self Care | Payer: Medicaid Other | Attending: Emergency Medicine | Admitting: Emergency Medicine

## 2020-01-12 DIAGNOSIS — R55 Syncope and collapse: Secondary | ICD-10-CM | POA: Insufficient documentation

## 2020-01-12 DIAGNOSIS — R251 Tremor, unspecified: Secondary | ICD-10-CM | POA: Insufficient documentation

## 2020-01-12 DIAGNOSIS — U071 COVID-19: Secondary | ICD-10-CM | POA: Insufficient documentation

## 2020-01-12 DIAGNOSIS — F17210 Nicotine dependence, cigarettes, uncomplicated: Secondary | ICD-10-CM | POA: Insufficient documentation

## 2020-01-12 DIAGNOSIS — J069 Acute upper respiratory infection, unspecified: Secondary | ICD-10-CM | POA: Insufficient documentation

## 2020-01-12 LAB — CBG MONITORING, ED: Glucose-Capillary: 117 mg/dL — ABNORMAL HIGH (ref 70–99)

## 2020-01-12 LAB — CBC
HCT: 45.7 % (ref 39.0–52.0)
Hemoglobin: 16.4 g/dL (ref 13.0–17.0)
MCH: 29.8 pg (ref 26.0–34.0)
MCHC: 35.9 g/dL (ref 30.0–36.0)
MCV: 82.9 fL (ref 80.0–100.0)
Platelets: 249 10*3/uL (ref 150–400)
RBC: 5.51 MIL/uL (ref 4.22–5.81)
RDW: 11.4 % — ABNORMAL LOW (ref 11.5–15.5)
WBC: 9 10*3/uL (ref 4.0–10.5)
nRBC: 0 % (ref 0.0–0.2)

## 2020-01-12 LAB — BASIC METABOLIC PANEL
Anion gap: 13 (ref 5–15)
BUN: 12 mg/dL (ref 6–20)
CO2: 20 mmol/L — ABNORMAL LOW (ref 22–32)
Calcium: 9.6 mg/dL (ref 8.9–10.3)
Chloride: 96 mmol/L — ABNORMAL LOW (ref 98–111)
Creatinine, Ser: 1.82 mg/dL — ABNORMAL HIGH (ref 0.61–1.24)
GFR calc Af Amer: 59 mL/min — ABNORMAL LOW (ref >60)
GFR calc non Af Amer: 50 mL/min — ABNORMAL LOW (ref >60)
Glucose, Bld: 114 mg/dL — ABNORMAL HIGH (ref 70–99)
Potassium: 3.4 mmol/L — ABNORMAL LOW (ref 3.5–5.1)
Sodium: 129 mmol/L — ABNORMAL LOW (ref 135–145)

## 2020-01-12 MED ORDER — SODIUM CHLORIDE 0.9 % IV BOLUS: 1000.0000 mL | Freq: Once | INTRAVENOUS | Status: DC

## 2020-01-12 MED ORDER — SODIUM CHLORIDE 0.9% FLUSH: 3.0000 mL | Freq: Once | INTRAVENOUS | Status: DC

## 2020-01-12 NOTE — ED Provider Notes (Signed)
Emergency Department Provider Note   I have reviewed the triage vital signs and the nursing notes.   HISTORY  Chief Complaint Loss of Consciousness   HPI John [name] is a 24 y.o. male with past medical history of substance abuse presents to the emergency department with syncope this morning.  Patient states that he relapsed on methamphetamine last night after being clean for 4 months.  He did IV drugs but had no unusual response last night.  This morning, he had a meeting with his parole officer and was trying to clear the drugs from his system.  He was drinking a lot of water and took a detox drink with unknown herbal supplements.  He states after that he was driving to his appointment when he suddenly felt very heavy, tingly in the face and hands, and then had an apparent syncopal episode.  He presents to the emergency department in that setting without significant symptoms at this time.  He states he still feels somewhat jittery.  Denies active chest pain or heart palpitations.  Past Medical History:  Diagnosis Date  . Acne nodule    on going  . Aggressive behavior 04/13/2019  . OD (overdose of drug), intentional self-harm, initial encounter (HCC) 04/13/2019  . Substance abuse (HCC) 04/13/2019  . Suicide attempt (HCC) 04/13/2019    Patient Active Problem List   Diagnosis Date Noted  . Chronic post-traumatic stress disorder (PTSD) 03/29/2019  . Major depressive disorder, recurrent episode, moderate (HCC) 03/29/2019  . Generalized anxiety disorder 11/23/2018  . Unspecified episodic mood disorder 08/05/2014  . Polysubstance abuse (HCC) 08/05/2014  . Suicidal ideation 08/04/2014    History reviewed. No pertinent surgical history.  Allergies Tessalon [benzonatate] and Acetaminophen  Family History  Problem Relation Age of Onset  . Bipolar disorder Mother     Social History Social History   Tobacco Use  . Smoking status: Current Every Day Smoker    Packs/day:  1.00    Years: 5.00    Pack years: 5.00    Types: Cigarettes  . Smokeless tobacco: Never Used  Substance Use Topics  . Alcohol use: Yes    Alcohol/week: 6.0 standard drinks    Types: 6 Shots of liquor per week    Comment: daily drinker  . Drug use: Yes    Types: Marijuana, Oxycodone, Methamphetamines    Comment: heroin     Review of Systems  Constitutional: No fever/chills Eyes: No visual changes. ENT: No sore throat. Loss of smell x 2 weeks. Cardiovascular: Denies chest pain. Positive syncope.  Respiratory: Denies shortness of breath. Gastrointestinal: No abdominal pain.  No nausea, no vomiting.  No diarrhea.  No constipation. Genitourinary: Negative for dysuria. Musculoskeletal: Negative for back pain. Skin: Negative for rash. Neurological: Negative for headaches, focal weakness or numbness.  10-point ROS otherwise negative.  ____________________________________________   PHYSICAL EXAM:  VITAL SIGNS: ED Triage Vitals  Enc Vitals Group     BP 01/12/20 1642 (!) 151/74     Pulse Rate 01/12/20 1642 (!) 105     Resp 01/12/20 1642 20     Temp 01/12/20 1642 97.9 F (36.6 C)     Temp Source 01/12/20 1642 Oral     SpO2 01/12/20 1642 100 %   Constitutional: Alert and oriented. Well appearing and in no acute distress. Eyes: Conjunctivae are normal.  Head: Atraumatic. Nose: No congestion/rhinnorhea. Mouth/Throat: Mucous membranes are moist.  Neck: No stridor.  Cardiovascular: Normal rate, regular rhythm. Good peripheral circulation. Grossly normal heart  sounds.   Respiratory: Normal respiratory effort.  No retractions. Lungs CTAB. Gastrointestinal: Soft and nontender. No distention.  Musculoskeletal: No gross deformities of extremities. Neurologic:  Normal speech and language. No gross focal neurologic deficits are appreciated.  Skin:  Skin is warm, dry and intact. No rash noted.  ____________________________________________   LABS (all labs ordered are listed, but  only abnormal results are displayed)  Labs Reviewed  SARS CORONAVIRUS 2 (TAT 6-24 HRS) - Abnormal; Notable for the following components:      Result Value   SARS Coronavirus 2 POSITIVE (*)    All other components within normal limits  BASIC METABOLIC PANEL - Abnormal; Notable for the following components:   Sodium 129 (*)    Potassium 3.4 (*)    Chloride 96 (*)    CO2 20 (*)    Glucose, Bld 114 (*)    Creatinine, Ser 1.82 (*)    GFR calc non Af Amer 50 (*)    GFR calc Af Amer 59 (*)    All other components within normal limits  CBC - Abnormal; Notable for the following components:   RDW 11.4 (*)    All other components within normal limits  CBG MONITORING, ED - Abnormal; Notable for the following components:   Glucose-Capillary 117 (*)    All other components within normal limits   ____________________________________________  EKG   EKG Interpretation  Date/Time:  Monday January 12 2020 16:47:46 EST Ventricular Rate:  96 PR Interval:    QRS Duration: 104 QT Interval:  354 QTC Calculation: 448 R Axis:   89 Text Interpretation: Sinus rhythm Minimal ST depression, inferior leads No STEMI Confirmed by Alona Bene [phone]) on 01/12/2020 5:16:21 PM       ____________________________________________  RADIOLOGY  None  ____________________________________________   PROCEDURES  Procedure(s) performed:   Procedures  None ____________________________________________   INITIAL IMPRESSION / ASSESSMENT AND PLAN / ED COURSE  Pertinent labs & imaging results that were available during my care of the patient were reviewed by me and considered in my medical decision making (see chart for details).   Patient presents to the emergency department for evaluation of syncope.  He has been using methamphetamine last night and drinking a lot of water along with an herbal detox type drink of unknown content this morning.  EKG is reassuring.  I have added labs and will follow chest  x-ray.  Follow on telemetry here in the emergency department and reassess after IV fluids and orthostatic vitals.  05:30 PM  Called back to patient's room.  He is asking to have his IV removed and he is ready to leave.  We had discussion that his evaluation for syncope is not yet complete and we could be missing potentially harmful or even deadly cause for his symptoms.  He does not appear under the influence of drugs.  I believe he has the capacity to make this decision.  He is encouraged to follow with primary care doctor and return to the emergency department if symptoms worsen.  He did also mention some cough along with loss of smell and taste.  We will send Covid PCR and he will follow the results in the MyChart app.  Advised to remain in quarantine for 10 days. ____________________________________________  FINAL CLINICAL IMPRESSION(S) / ED DIAGNOSES  Final diagnoses:  Syncope and collapse  Viral URI    Note:  This document was prepared using Dragon voice recognition software and may include unintentional dictation errors.  Alona Bene,  MD, [facility] Emergency Medicine    Janyia Guion, [facility], MD 01/13/20 [phone]

## 2020-01-12 NOTE — ED Triage Notes (Addendum)
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 He started to feel numb all over. Pt hyperventilating during triage. CBG 117

## 2020-01-12 NOTE — Discharge Instructions (Signed)
You were seen in the emergency department today with passing out.  Please drink plenty of fluids and call the primary care doctor listed to schedule a follow-up appointment.  You have left AGAINST MEDICAL ADVICE.  If you wish to return for additional treatment or change your mind please do so.

## 2020-01-13 ENCOUNTER — Telehealth (HOSPITAL_COMMUNITY): Payer: Self-pay

## 2020-01-13 LAB — SARS CORONAVIRUS 2 (TAT 6-24 HRS): SARS Coronavirus 2: POSITIVE — AB

## 2020-02-03 ENCOUNTER — Encounter (HOSPITAL_COMMUNITY): Payer: Self-pay | Admitting: *Deleted

## 2020-02-03 ENCOUNTER — Emergency Department (HOSPITAL_COMMUNITY)
Admission: EM | Admit: 2020-02-03 | Discharge: 2020-02-03 | Disposition: A | Discharge status: Home / Self Care | Payer: Self-pay | Attending: Emergency Medicine | Admitting: Emergency Medicine

## 2020-02-03 ENCOUNTER — Emergency Department (HOSPITAL_COMMUNITY): Payer: Self-pay

## 2020-02-03 ENCOUNTER — Other Ambulatory Visit: Payer: Self-pay

## 2020-02-03 DIAGNOSIS — S62647A Nondisplaced fracture of proximal phalanx of left little finger, initial encounter for closed fracture: Secondary | ICD-10-CM | POA: Insufficient documentation

## 2020-02-03 DIAGNOSIS — F17210 Nicotine dependence, cigarettes, uncomplicated: Secondary | ICD-10-CM | POA: Insufficient documentation

## 2020-02-03 DIAGNOSIS — Y999 Unspecified external cause status: Secondary | ICD-10-CM | POA: Insufficient documentation

## 2020-02-03 DIAGNOSIS — W208XXA Other cause of strike by thrown, projected or falling object, initial encounter: Secondary | ICD-10-CM | POA: Insufficient documentation

## 2020-02-03 DIAGNOSIS — Y93H2 Activity, gardening and landscaping: Secondary | ICD-10-CM | POA: Insufficient documentation

## 2020-02-03 DIAGNOSIS — Z79899 Other long term (current) drug therapy: Secondary | ICD-10-CM | POA: Insufficient documentation

## 2020-02-03 DIAGNOSIS — Y92007 Garden or yard of unspecified non-institutional (private) residence as the place of occurrence of the external cause: Secondary | ICD-10-CM | POA: Insufficient documentation

## 2020-02-03 MED ORDER — MELOXICAM 7.5 MG PO TABS: 7.5000 mg | ORAL_TABLET | Freq: Every day | ORAL | 0 refills | Status: AC

## 2020-02-03 NOTE — ED Provider Notes (Signed)
[facility] Provider Note   CSN: [id number] Arrival date & time: 02/03/20  1754     History Chief Complaint  Patient presents with  . Finger Injury    John [name] is a 24 y.o. male past medical history of substance abuse, overdose who presents for evaluation of left fifth digit pain.  He reports that about 4 days ago, he was drinking some alcohol and was using a lawnmower and he states a piece of the metal fell on his finger.  He initially went to [facility] but left prior to being seen.  He states he continues to have pain, swelling, prompting ED visit.  He has been taking Tylenol and ibuprofen with minimal improvement.  He reports pain is worse with movement.  Denies any numbness/weakness.  The history is provided by the patient.       Past Medical History:  Diagnosis Date  . Acne nodule    on going  . Aggressive behavior 04/13/2019  . OD (overdose of drug), intentional self-harm, initial encounter (HCC) 04/13/2019  . Substance abuse (HCC) 04/13/2019  . Suicide attempt (HCC) 04/13/2019    Patient Active Problem List   Diagnosis Date Noted  . Chronic post-traumatic stress disorder (PTSD) 03/29/2019  . Major depressive disorder, recurrent episode, moderate (HCC) 03/29/2019  . Generalized anxiety disorder 11/23/2018  . Unspecified episodic mood disorder 08/05/2014  . Polysubstance abuse (HCC) 08/05/2014  . Suicidal ideation 08/04/2014    History reviewed. No pertinent surgical history.     Family History  Problem Relation Age of Onset  . Bipolar disorder Mother     Social History   Tobacco Use  . Smoking status: Current Every Day Smoker    Packs/day: 1.00    Years: 5.00    Pack years: 5.00    Types: Cigarettes  . Smokeless tobacco: Never Used  Substance Use Topics  . Alcohol use: Yes    Alcohol/week: 6.0 standard drinks    Types: 6 Shots of liquor per week    Comment: daily drinker  . Drug use: Yes    Types:  Marijuana, Oxycodone, Methamphetamines    Comment: heroin     Home Medications Prior to Admission medications   Medication Sig Start Date End Date Taking? Authorizing Provider  amphetamine-dextroamphetamine (ADDERALL) 10 MG tablet Take 20 mg by mouth 2 (two) times a day.    [provider]  cephALEXin (KEFLEX) 500 MG capsule Take 1 capsule (500 mg total) by mouth 2 (two) times daily. 04/13/19   Lawyer, Cristal Deer, PA-C  clonazePAM (KLONOPIN) 1 MG tablet Take 1 mg by mouth 2 (two) times daily.    [provider]  doxycycline (VIBRAMYCIN) 100 MG capsule Take 1 capsule (100 mg total) by mouth 2 (two) times daily. 04/13/19   Lawyer, Cristal Deer, PA-C  gabapentin (NEURONTIN) 300 MG capsule Take 1 capsule (300 mg total) by mouth 3 (three) times daily. 03/29/19   Charm Rings, NP  hydrOXYzine (ATARAX/VISTARIL) 25 MG tablet Take 1 tablet (25 mg total) by mouth every 6 (six) hours as needed for anxiety. Patient not taking: Reported on 04/13/2019 03/29/19   Charm Rings, NP  meloxicam (MOBIC) 7.5 MG tablet Take 1 tablet (7.5 mg total) by mouth daily for 7 days. 02/03/20 02/10/20  Maxwell Caul, PA-C  Multiple Vitamin (MULTIVITAMIN WITH MINERALS) TABS tablet Take 1 tablet by mouth daily.    [provider]  prazosin (MINIPRESS) 2 MG capsule Take 1 capsule (2 mg total) by mouth  at bedtime. Patient not taking: Reported on 04/13/2019 03/29/19   Charm Rings, NP  traZODone (DESYREL) 50 MG tablet Take 1 tablet (50 mg total) by mouth at bedtime and may repeat dose one time if needed. Patient not taking: Reported on 04/13/2019 03/29/19   Charm Rings, NP    Allergies    Tessalon [benzonatate] and Acetaminophen  Review of Systems   Review of Systems  Musculoskeletal:       Fifth digit pain  Neurological: Negative for weakness and numbness.  All other systems reviewed and are negative.   Physical Exam Updated Vital Signs BP 118/78 (BP Location: Left Arm)   Pulse 95    Temp 97.8 F (36.6 C) (Oral)   Resp 16   Ht 6\' 3"  (1.905 m)   Wt 88.5 kg   SpO2 99%   BMI 24.37 kg/m   Physical Exam Vitals and nursing note reviewed.  Constitutional:      Appearance: He is well-developed.  HENT:     Head: Normocephalic and atraumatic.  Eyes:     General: No scleral icterus.       Right eye: No discharge.        Left eye: No discharge.     Conjunctiva/sclera: Conjunctivae normal.  Cardiovascular:     Pulses:          Radial pulses are 2+ on the right side and 2+ on the left side.  Pulmonary:     Effort: Pulmonary effort is normal.  Musculoskeletal:     Comments: Tenderness palpation noted to left fifth digit with overlying soft tissue swelling of more proximal end.  Flexion/extension of both the PIP and DIP intact but he does report pain with doing so.  No tenderness palpation noted wrist.  Skin:    General: Skin is warm and dry.     Capillary Refill: Capillary refill takes less than 2 seconds.     Comments: Good distal cap refill. LUE is not dusky in appearance or cool to touch.  Neurological:     Mental Status: He is alert.     Comments: Sensation intact along major nerve distributions of BUE  Psychiatric:        Speech: Speech normal.        Behavior: Behavior normal.     ED Results / Procedures / Treatments   Labs (all labs ordered are listed, but only abnormal results are displayed) Labs Reviewed - No data to display  EKG None  Radiology DG Finger Little Left  Result Date: 02/03/2020 CLINICAL DATA:  Lawnmower injury of the small finger 4 days ago EXAM: LEFT LITTLE FINGER 2+V COMPARISON:  Radiographs from 01/30/2020 from [facility] FINDINGS: Oblique fracture of the proximal phalanx small finger extending from the proximal metaphysis to the distal metaphysis, with about 0.2 cm of overlap. Old ossicle along the medial margin of the head of the proximal phalanx of the fourth finger. IMPRESSION: 1. The acute oblique fracture of the proximal  phalanx of the left small finger is not changed compared to the radiographs from [facility] dated 01/30/2020. Electronically Signed   By: 02/01/2020 M.D.   On: 02/03/2020 18:32    Procedures Procedures (including critical care time)  Medications Ordered in ED Medications - No data to display  ED Course  I have reviewed the triage vital signs and the nursing notes.  Pertinent labs & imaging results that were available during my care of the patient were reviewed by me and  considered in my medical decision making (see chart for details).    MDM Rules/Calculators/A&P                      24 year old male who presents for evaluation of left fifth digit pain, swelling after an accident that occurred about 4 days ago.  Seen at [facility] but did not stay for results.  Reports worsening pain.  On initially arrival, he is afebrile, nontoxic-appearing.  Vital signs are stable.  He is neurovascularly intact.  He has pain and swelling of the proximal end of his left fifth digit.  Do not suspect tendon injury based on history/physical exam.  Concern for fracture versus location.  X-rays ordered at triage.    X-rays show an acute oblique fracture of the proximal phalanx of the left small finger.  I discussed results with patient.  We will plan for splint.  Will given hand follow-up for further evaluation.  Given patient's history of polysubstance abuse, I discussed with him that I did not feel comfortable prescribing him narcotics.  Patient was understandable.  We will plan to give him a short course of Mobic to help with acute pain. At this time, patient exhibits no emergent life-threatening condition that require further evaluation in ED or admission. Patient had ample opportunity for questions and discussion. All patient's questions were answered with full understanding. Strict return precautions discussed. Patient expresses understanding and agreement to plan.   Portions of this note were  generated with [employer]. Dictation errors may occur despite best attempts at proofreading.    Final Clinical Impression(s) / ED Diagnoses Final diagnoses:  Closed nondisplaced fracture of proximal phalanx of left little finger, initial encounter    Rx / DC Orders ED Discharge Orders         Ordered    meloxicam (MOBIC) 7.5 MG tablet  Daily     02/03/20 1859           Desma Mcgregor 02/03/20 2342    Carmin Muskrat, MD 02/04/20 [phone]

## 2020-02-03 NOTE — ED Notes (Signed)
Ortho has been called to apply splint

## 2020-02-03 NOTE — ED Triage Notes (Signed)
Pt states he caught his left little finger between a pulley and shaft on lawn mower about 4 days ago. Continue to have swelling and pain

## 2020-02-03 NOTE — ED Notes (Signed)
Splint applied by ortho tech

## 2020-02-03 NOTE — Discharge Instructions (Signed)
Take Mobic as directed for severe breakthrough pain. Wear splint for support and stabilization. Follow-up with referred orthopedic doctor.  Return the emergency department for any worsening pain, redness or swelling.

## 2020-02-19 ENCOUNTER — Emergency Department (HOSPITAL_COMMUNITY): Payer: Self-pay

## 2020-02-19 ENCOUNTER — Other Ambulatory Visit: Payer: Self-pay

## 2020-02-19 ENCOUNTER — Emergency Department (HOSPITAL_COMMUNITY)
Admission: EM | Admit: 2020-02-19 | Discharge: 2020-02-19 | Disposition: A | Discharge status: Home / Self Care | Payer: Self-pay | Attending: Emergency Medicine | Admitting: Emergency Medicine

## 2020-02-19 ENCOUNTER — Encounter (HOSPITAL_COMMUNITY): Payer: Self-pay | Admitting: Emergency Medicine

## 2020-02-19 DIAGNOSIS — R55 Syncope and collapse: Secondary | ICD-10-CM | POA: Insufficient documentation

## 2020-02-19 DIAGNOSIS — R0789 Other chest pain: Secondary | ICD-10-CM | POA: Insufficient documentation

## 2020-02-19 DIAGNOSIS — Z5321 Procedure and treatment not carried out due to patient leaving prior to being seen by health care provider: Secondary | ICD-10-CM | POA: Insufficient documentation

## 2020-02-19 NOTE — ED Triage Notes (Signed)
Pt reports having chest pains that radiates to arm and back x 3 hours after taking aderall and extense.
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
## 2020-04-08 DIAGNOSIS — L700 Acne vulgaris: Secondary | ICD-10-CM | POA: Insufficient documentation

## 2020-05-12 ENCOUNTER — Emergency Department (HOSPITAL_COMMUNITY)
Admission: EM | Admit: 2020-05-12 | Discharge: 2020-05-12 | Disposition: A | Discharge status: Home / Self Care | Payer: Medicaid Other | Attending: Emergency Medicine | Admitting: Emergency Medicine

## 2020-05-12 ENCOUNTER — Encounter (HOSPITAL_COMMUNITY): Payer: Self-pay

## 2020-05-12 ENCOUNTER — Ambulatory Visit (HOSPITAL_COMMUNITY): Payer: Medicaid Other

## 2020-05-12 DIAGNOSIS — F419 Anxiety disorder, unspecified: Secondary | ICD-10-CM | POA: Insufficient documentation

## 2020-05-12 DIAGNOSIS — R21 Rash and other nonspecific skin eruption: Secondary | ICD-10-CM | POA: Insufficient documentation

## 2020-05-12 DIAGNOSIS — Z5321 Procedure and treatment not carried out due to patient leaving prior to being seen by health care provider: Secondary | ICD-10-CM | POA: Insufficient documentation

## 2020-05-12 DIAGNOSIS — R0602 Shortness of breath: Secondary | ICD-10-CM | POA: Insufficient documentation

## 2020-05-12 DIAGNOSIS — R42 Dizziness and giddiness: Secondary | ICD-10-CM | POA: Insufficient documentation

## 2020-05-12 LAB — CBC
HCT: 47 % (ref 39.0–52.0)
Hemoglobin: 16.2 g/dL (ref 13.0–17.0)
MCH: 30.3 pg (ref 26.0–34.0)
MCHC: 34.5 g/dL (ref 30.0–36.0)
MCV: 88 fL (ref 80.0–100.0)
Platelets: 252 10*3/uL (ref 150–400)
RBC: 5.34 MIL/uL (ref 4.22–5.81)
RDW: 12.3 % (ref 11.5–15.5)
WBC: 8.2 10*3/uL (ref 4.0–10.5)
nRBC: 0 % (ref 0.0–0.2)

## 2020-05-12 LAB — BASIC METABOLIC PANEL
Anion gap: 12 (ref 5–15)
BUN: 18 mg/dL (ref 6–20)
CO2: 18 mmol/L — ABNORMAL LOW (ref 22–32)
Calcium: 9.1 mg/dL (ref 8.9–10.3)
Chloride: 109 mmol/L (ref 98–111)
Creatinine, Ser: 1.09 mg/dL (ref 0.61–1.24)
GFR calc Af Amer: >60 mL/min (ref >60)
GFR calc non Af Amer: >60 mL/min (ref >60)
Glucose, Bld: 78 mg/dL (ref 70–99)
Potassium: 3.9 mmol/L (ref 3.5–5.1)
Sodium: 139 mmol/L (ref 135–145)

## 2020-05-12 LAB — LACTIC ACID, PLASMA: Lactic Acid, Venous: 1.8 mmol/L (ref 0.5–1.9)

## 2020-05-12 NOTE — ED Triage Notes (Addendum)
Pt states that his skin has been breaking out for 2 months. Pt states that she has had numbness on the left side of his body. Pt states that he is sure he has sepsis. Pt states that his grandma is a Engineer, civil (consulting) and said to come in. Pt states that he has had shortness of breath, confusion, dizziness. Pt denies any drug use. Pt states that he was here for the same 1.5 months ago. Pt states that he was given abx at that time (doxy (still taking) and bactrim). Pt states hx of anxiety.  Pt states he is currently seeing stars.

## 2020-09-10 ENCOUNTER — Emergency Department (HOSPITAL_COMMUNITY)
Admission: EM | Admit: 2020-09-10 | Discharge: 2020-09-10 | Disposition: A | Discharge status: Home / Self Care | Payer: Self-pay | Attending: Emergency Medicine | Admitting: Emergency Medicine

## 2020-09-10 ENCOUNTER — Encounter (HOSPITAL_COMMUNITY): Payer: Self-pay

## 2020-09-10 ENCOUNTER — Emergency Department (HOSPITAL_COMMUNITY): Payer: Self-pay

## 2020-09-10 ENCOUNTER — Other Ambulatory Visit: Payer: Self-pay

## 2020-09-10 DIAGNOSIS — Z79899 Other long term (current) drug therapy: Secondary | ICD-10-CM | POA: Insufficient documentation

## 2020-09-10 DIAGNOSIS — R112 Nausea with vomiting, unspecified: Secondary | ICD-10-CM

## 2020-09-10 DIAGNOSIS — F10929 Alcohol use, unspecified with intoxication, unspecified: Secondary | ICD-10-CM

## 2020-09-10 DIAGNOSIS — R160 Hepatomegaly, not elsewhere classified: Secondary | ICD-10-CM | POA: Insufficient documentation

## 2020-09-10 DIAGNOSIS — R1013 Epigastric pain: Secondary | ICD-10-CM

## 2020-09-10 DIAGNOSIS — F17210 Nicotine dependence, cigarettes, uncomplicated: Secondary | ICD-10-CM | POA: Insufficient documentation

## 2020-09-10 DIAGNOSIS — F10129 Alcohol abuse with intoxication, unspecified: Secondary | ICD-10-CM | POA: Insufficient documentation

## 2020-09-10 LAB — CBC WITH DIFFERENTIAL/PLATELET
Abs Immature Granulocytes: 0.08 10*3/uL — ABNORMAL HIGH (ref 0.00–0.07)
Basophils Absolute: 0.1 10*3/uL (ref 0.0–0.1)
Basophils Relative: 1 %
Eosinophils Absolute: 0.1 10*3/uL (ref 0.0–0.5)
Eosinophils Relative: 1 %
HCT: 46.4 % (ref 39.0–52.0)
Hemoglobin: 16.6 g/dL (ref 13.0–17.0)
Immature Granulocytes: 1 %
Lymphocytes Relative: 42 %
Lymphs Abs: 6.3 10*3/uL — ABNORMAL HIGH (ref 0.7–4.0)
MCH: 30.9 pg (ref 26.0–34.0)
MCHC: 35.8 g/dL (ref 30.0–36.0)
MCV: 86.4 fL (ref 80.0–100.0)
Monocytes Absolute: 0.9 10*3/uL (ref 0.1–1.0)
Monocytes Relative: 6 %
Neutro Abs: 7.8 10*3/uL — ABNORMAL HIGH (ref 1.7–7.7)
Neutrophils Relative %: 49 %
Platelets: 321 10*3/uL (ref 150–400)
RBC: 5.37 MIL/uL (ref 4.22–5.81)
RDW: 12.3 % (ref 11.5–15.5)
WBC: 15.2 10*3/uL — ABNORMAL HIGH (ref 4.0–10.5)
nRBC: 0 % (ref 0.0–0.2)

## 2020-09-10 LAB — COMPREHENSIVE METABOLIC PANEL
ALT: 168 U/L — ABNORMAL HIGH (ref 0–44)
AST: 98 U/L — ABNORMAL HIGH (ref 15–41)
Albumin: 4.5 g/dL (ref 3.5–5.0)
Alkaline Phosphatase: 57 U/L (ref 38–126)
Anion gap: 20 — ABNORMAL HIGH (ref 5–15)
BUN: 15 mg/dL (ref 6–20)
CO2: 15 mmol/L — ABNORMAL LOW (ref 22–32)
Calcium: 9.2 mg/dL (ref 8.9–10.3)
Chloride: 105 mmol/L (ref 98–111)
Creatinine, Ser: 1.17 mg/dL (ref 0.61–1.24)
GFR, Estimated: >60 mL/min (ref >60)
Glucose, Bld: 70 mg/dL (ref 70–99)
Potassium: 3.4 mmol/L — ABNORMAL LOW (ref 3.5–5.1)
Sodium: 140 mmol/L (ref 135–145)
Total Bilirubin: 0.5 mg/dL (ref 0.3–1.2)
Total Protein: 8.1 g/dL (ref 6.5–8.1)

## 2020-09-10 LAB — RAPID URINE DRUG SCREEN, HOSP PERFORMED
Amphetamines: NOT DETECTED
Barbiturates: NOT DETECTED
Benzodiazepines: NOT DETECTED
Cocaine: NOT DETECTED
Opiates: NOT DETECTED
Tetrahydrocannabinol: POSITIVE — AB

## 2020-09-10 LAB — ETHANOL: Alcohol, Ethyl (B): 78 mg/dL — ABNORMAL HIGH (ref <10)

## 2020-09-10 LAB — LIPASE, BLOOD: Lipase: 24 U/L (ref 11–51)

## 2020-09-10 MED ORDER — ONDANSETRON HCL 4 MG PO TABS: 4.0000 mg | ORAL_TABLET | Freq: Three times a day (TID) | ORAL | 0 refills | Status: DC | PRN

## 2020-09-10 MED ORDER — IOHEXOL 300 MG/ML  SOLN
100.0000 mL | Freq: Once | INTRAMUSCULAR | Status: AC | PRN
  Administered 2020-09-10: 100 mL via INTRAVENOUS

## 2020-09-10 MED ORDER — PROMETHAZINE HCL 25 MG/ML IJ SOLN
12.5000 mg | Freq: Once | INTRAMUSCULAR | Status: AC
  Administered 2020-09-10: 12.5 mg via INTRAVENOUS
  Filled 2020-09-10: qty 1

## 2020-09-10 MED ORDER — ALUM & MAG HYDROXIDE-SIMETH 200-200-20 MG/5ML PO SUSP
30.0000 mL | Freq: Once | ORAL | Status: AC
  Administered 2020-09-10: 30 mL via ORAL
  Filled 2020-09-10: qty 30

## 2020-09-10 MED ORDER — LIDOCAINE VISCOUS HCL 2 % MT SOLN
15.0000 mL | Freq: Once | OROMUCOSAL | Status: AC
  Administered 2020-09-10: 15 mL via ORAL
  Filled 2020-09-10: qty 15

## 2020-09-10 MED ORDER — POTASSIUM CHLORIDE CRYS ER 20 MEQ PO TBCR
30.0000 meq | EXTENDED_RELEASE_TABLET | Freq: Once | ORAL | Status: DC
  Filled 2020-09-10: qty 1

## 2020-09-10 MED ORDER — SODIUM CHLORIDE 0.9 % IV BOLUS
1000.0000 mL | Freq: Once | INTRAVENOUS | Status: AC
  Administered 2020-09-10: 1000 mL via INTRAVENOUS

## 2020-09-10 MED ORDER — FAMOTIDINE 20 MG PO TABS: 20.0000 mg | ORAL_TABLET | Freq: Two times a day (BID) | ORAL | 0 refills | Status: DC

## 2020-09-10 MED ORDER — ZIPRASIDONE MESYLATE 20 MG IM SOLR
INTRAMUSCULAR | Status: AC
  Administered 2020-09-10: 20 mg via INTRAMUSCULAR
  Filled 2020-09-10: qty 20

## 2020-09-10 MED ORDER — ZIPRASIDONE MESYLATE 20 MG IM SOLR: 20.0000 mg | Freq: Once | INTRAMUSCULAR | Status: AC

## 2020-09-10 MED ORDER — STERILE WATER FOR INJECTION IJ SOLN
INTRAMUSCULAR | Status: AC
  Filled 2020-09-10: qty 10

## 2020-09-10 MED ORDER — PANTOPRAZOLE SODIUM 40 MG IV SOLR
40.0000 mg | Freq: Once | INTRAVENOUS | Status: AC
  Administered 2020-09-10: 40 mg via INTRAVENOUS
  Filled 2020-09-10: qty 40

## 2020-09-10 MED ORDER — LACTATED RINGERS IV BOLUS
1000.0000 mL | Freq: Once | INTRAVENOUS | Status: AC
  Administered 2020-09-10: 1000 mL via INTRAVENOUS

## 2020-09-10 MED ORDER — SODIUM CHLORIDE (PF) 0.9 % IJ SOLN
INTRAMUSCULAR | Status: AC
  Filled 2020-09-10: qty 50

## 2020-09-10 MED ORDER — ONDANSETRON HCL 4 MG/2ML IJ SOLN
4.0000 mg | Freq: Once | INTRAMUSCULAR | Status: AC
  Administered 2020-09-10: 4 mg via INTRAVENOUS
  Filled 2020-09-10: qty 2

## 2020-09-10 NOTE — ED Notes (Signed)
Patient given crackers and ginger ale for PO challenge with instructions to consume

## 2020-09-10 NOTE — ED Notes (Signed)
Pt attempted to drink water again and began to vomit

## 2020-09-10 NOTE — ED Provider Notes (Signed)
MSE was initiated and I personally evaluated the patient and placed orders (if any) at  6:44 AM on September 10, 2020.  The patient appears stable so that the remainder of the MSE may be completed by another provider.  The patient is agitated and unable to give a history or cooperate with his care.  Geodon 20 mg IM ordered.  He will need to calm down before a proper history and physical can be obtained.     Parke Jandreau, Jonny Ruiz, MD 09/10/20 [phone]

## 2020-09-10 NOTE — Discharge Instructions (Addendum)
Like we discussed, I am prescribing you a medication called Pepcid.  This will help with the excess stomach acid and hopefully your stomach pain.  Please work on alcohol cessation.  I put in a referral to peer support as well.  They should be reaching out to you if you find that you would like help with quitting alcohol.  Like we discussed, I am giving you 2 referrals below.  The first is to [facility] and wellness.  The other is to [facility] gastroenterology.  You need to follow-up with both of them soon as possible.  You will need repeat imaging of your abdomen to examine the mass on your liver.  If your symptoms worsen once again, please return to the ER for reevaluation.  It was a pleasure to meet you.

## 2020-09-10 NOTE — ED Provider Notes (Signed)
[facility]-EMERGENCY DEPT Provider Note   CSN: [id number] Arrival date & time: 09/10/20  0636     History Chief Complaint  Patient presents with  . Alcohol Intoxication    John [name] is a 24 y.o. male.  HPI   Patient is a 24 year old male with a history of SI, polysubstance abuse, overdose, aggressive behavior, GAD, MDD, who presents to the emergency department due to abdominal pain as well as hematemesis that occurred just prior to arrival.  Per nursing note, EMS states that patient drank 3 bottles of wine and 40 ounces of beer.  Patient told EMS as well as nursing staff that he was vomiting blood in the bathroom.  Per EMS, there were no signs of blood at his home.  No visible blood on or around the patient.  Patient has continued to vomit since arriving to the emergency department and has had no visible hematemesis.  On reevaluation when patient was clinically sober, he states that he could "taste blood in his mouth" last night but denies any visible hematemesis.    He was initially yelling and cursing at the staff and the prior attending physician gave patient IM Geodon as well as Protonix and Zofran.  I have since reevaluated the patient and he states that he is feeling much better.  He is still having moderate epigastric pain.  He states that he has a history of "bleeding ulcers".  He reports mild nausea at this time.  No signs of blood in patient's emesis bag.  Patient does confirm his history of polysubstance abuse but states that he has not been taking drugs "for a long time".  He does confirm regular alcohol use and states that "he might have overdone it last night". He denies drinking on a daily basis. No other complaints at this time.      Past Medical History:  Diagnosis Date  . Acne nodule    on going  . Aggressive behavior 04/13/2019  . OD (overdose of drug), intentional self-harm, initial encounter (HCC) 04/13/2019  . Substance abuse (HCC)  04/13/2019  . Suicide attempt (HCC) 04/13/2019    Patient Active Problem List   Diagnosis Date Noted  . Chronic post-traumatic stress disorder (PTSD) 03/29/2019  . Major depressive disorder, recurrent episode, moderate (HCC) 03/29/2019  . Generalized anxiety disorder 11/23/2018  . Unspecified episodic mood disorder 08/05/2014  . Polysubstance abuse (HCC) 08/05/2014  . Suicidal ideation 08/04/2014    History reviewed. No pertinent surgical history.     Family History  Problem Relation Age of Onset  . Bipolar disorder Mother     Social History   Tobacco Use  . Smoking status: Current Every Day Smoker    Packs/day: 1.00    Years: 5.00    Pack years: 5.00    Types: Cigarettes  . Smokeless tobacco: Never Used  Vaping Use  . Vaping Use: Never used  Substance Use Topics  . Alcohol use: Yes    Alcohol/week: 6.0 standard drinks    Types: 6 Shots of liquor per week    Comment: daily drinker  . Drug use: Yes    Types: Marijuana, Oxycodone, Methamphetamines    Comment: heroin     Home Medications Prior to Admission medications   Medication Sig Start Date End Date Taking? Authorizing Provider  amphetamine-dextroamphetamine (ADDERALL) 10 MG tablet Take 20 mg by mouth 2 (two) times a day.    [provider]  cephALEXin (KEFLEX) 500 MG capsule Take 1 capsule (500  mg total) by mouth 2 (two) times daily. 04/13/19   Lawyer, Cristal Deer, PA-C  clonazePAM (KLONOPIN) 1 MG tablet Take 1 mg by mouth 2 (two) times daily.    [provider]  doxycycline (VIBRAMYCIN) 100 MG capsule Take 1 capsule (100 mg total) by mouth 2 (two) times daily. 04/13/19   Lawyer, Cristal Deer, PA-C  gabapentin (NEURONTIN) 300 MG capsule Take 1 capsule (300 mg total) by mouth 3 (three) times daily. 03/29/19   Charm Rings, NP  hydrOXYzine (ATARAX/VISTARIL) 25 MG tablet Take 1 tablet (25 mg total) by mouth every 6 (six) hours as needed for anxiety. Patient not taking: Reported on 04/13/2019  03/29/19   Charm Rings, NP  Multiple Vitamin (MULTIVITAMIN WITH MINERALS) TABS tablet Take 1 tablet by mouth daily.    [provider]  prazosin (MINIPRESS) 2 MG capsule Take 1 capsule (2 mg total) by mouth at bedtime. Patient not taking: Reported on 04/13/2019 03/29/19   Charm Rings, NP  traZODone (DESYREL) 50 MG tablet Take 1 tablet (50 mg total) by mouth at bedtime and may repeat dose one time if needed. Patient not taking: Reported on 04/13/2019 03/29/19   Charm Rings, NP    Allergies    Acetaminophen and Benzonatate  Review of Systems   Review of Systems  All other systems reviewed and are negative. Ten systems reviewed and are negative for acute change, except as noted in the HPI.    Physical Exam Updated Vital Signs BP (!) 155/91   Pulse 61   Temp 97.6 F (36.4 C) (Oral)   Resp 14   Ht 6\' 3"  (1.905 m)   Wt 90.7 kg   SpO2 100%   BMI 25.00 kg/m   Physical Exam Vitals and nursing note reviewed.  Constitutional:      General: He is not in acute distress.    Appearance: Normal appearance. He is normal weight. He is not ill-appearing, toxic-appearing or diaphoretic.     Comments: Well-developed adult male lying supine.  He answers questions clearly and appropriately.  HENT:     Head: Normocephalic and atraumatic.     Right Ear: External ear normal.     Left Ear: External ear normal.     Nose: Nose normal.     Mouth/Throat:     Mouth: Mucous membranes are moist.     Pharynx: Oropharynx is clear. No oropharyngeal exudate or posterior oropharyngeal erythema.  Eyes:     Extraocular Movements: Extraocular movements intact.  Cardiovascular:     Rate and Rhythm: Normal rate and regular rhythm.     Pulses: Normal pulses.     Heart sounds: Normal heart sounds. No murmur heard.  No friction rub. No gallop.      Comments: Regular rate and rhythm without murmurs, rubs, gallops. Pulmonary:     Effort: Pulmonary effort is normal. No respiratory distress.      Breath sounds: Normal breath sounds. No stridor. No wheezing, rhonchi or rales.     Comments: Lungs are clear to auscultation bilaterally.  No respiratory distress.  Oxygen saturations at 100% on room air. Abdominal:     General: Abdomen is flat.     Palpations: Abdomen is soft.     Tenderness: There is abdominal tenderness.     Comments: Abdomen is soft.  Moderate epigastric tenderness.  Negative Murphy sign.  No McBurney's point tenderness.  No rebound or guarding.  Negative CVA tenderness noted bilaterally.  Musculoskeletal:  General: Normal range of motion.     Cervical back: Normal range of motion and neck supple. No tenderness.  Skin:    General: Skin is warm and dry.  Neurological:     General: No focal deficit present.     Mental Status: He is alert and oriented to person, place, and time.  Psychiatric:        Attention and Perception: Attention normal.        Mood and Affect: Mood is anxious.        Behavior: Behavior is agitated.    ED Results / Procedures / Treatments   Labs (all labs ordered are listed, but only abnormal results are displayed) Labs Reviewed  ETHANOL - Abnormal; Notable for the following components:      Result Value   Alcohol, Ethyl (B) 78 (*)    All other components within normal limits  CBC WITH DIFFERENTIAL/PLATELET - Abnormal; Notable for the following components:   WBC 15.2 (*)    Neutro Abs 7.8 (*)    Lymphs Abs 6.3 (*)    Abs Immature Granulocytes 0.08 (*)    All other components within normal limits  COMPREHENSIVE METABOLIC PANEL - Abnormal; Notable for the following components:   Potassium 3.4 (*)    CO2 15 (*)    AST 98 (*)    ALT 168 (*)    Anion gap 20 (*)    All other components within normal limits  RAPID URINE DRUG SCREEN, HOSP PERFORMED - Abnormal; Notable for the following components:   Tetrahydrocannabinol POSITIVE (*)    All other components within normal limits  LIPASE, BLOOD   EKG None  Radiology CT ABDOMEN  PELVIS W CONTRAST  Result Date: 09/10/2020 CLINICAL DATA:  Abdominal pain and hematemesis. Elevated white blood cell count. Recent binge of alcohol consumption EXAM: CT ABDOMEN AND PELVIS WITH CONTRAST TECHNIQUE: Multidetector CT imaging of the abdomen and pelvis was performed using the standard protocol following bolus administration of intravenous contrast. CONTRAST:  OMNIPAQUE IOHEXOL 300 MG/ML  SOLN COMPARISON:  January 15, 2013. FINDINGS: Lower chest: Lung bases are clear. Hepatobiliary: There is a focal area of decreased attenuation in the right lobe of the liver near the dome measuring 2.4 x 2.0 cm. No other focal liver lesion is evident. The gallbladder wall is not appreciably thickened. There is no biliary duct dilatation. Pancreas: There is no pancreatic mass or inflammatory focus. Spleen: No splenic lesions are evident. Adrenals/Urinary Tract: Adrenals bilaterally appear normal. Kidneys bilaterally show no evident mass or hydronephrosis on either side. There is no evident renal or ureteral calculus on either side. Urinary bladder is midline with wall thickness within normal limits. Stomach/Bowel: There is moderate stool throughout the colon. Note that the rectum is mildly distended with stool. There is no perirectal wall thickening. Elsewhere, there is no appreciable bowel wall thickening. No evident bowel obstruction. Terminal ileum appears unremarkable. There is no demonstrable free air or portal venous air. Vascular/Lymphatic: There is no abdominal aortic aneurysm. No arterial vascular lesions are evident. Major venous structures appear patent. There is no evident adenopathy in the abdomen or pelvis. Reproductive: Prostate and seminal vesicles are normal in size and contour. Other: Appendix appears normal. No abscess or ascites evident in the abdomen or pelvis. Musculoskeletal: No blastic or lytic bone lesions. No intramuscular or abdominal wall lesions are evident. IMPRESSION: 1. No bowel wall  thickening or bowel obstruction. No abscess in the abdomen or pelvis. Appendix appears normal. Note that rectum  is mildly distended with stool. 2. Mass in the right lobe of the liver measuring 2.4 x 2.0 cm of uncertain etiology. This lesion does not show classic hemangioma changes on current examination. This finding warrants pre and serial post-contrast MR or CT of the liver to further assess. 3. No evident renal or ureteral calculus. No hydronephrosis. Urinary bladder wall thickness normal. Electronically Signed   By: Bretta BangWilliam  Woodruff III M.D.   On: 09/10/2020 09:04   Procedures Procedures (including critical care time)  Medications Ordered in ED Medications  sterile water (preservative free) injection (has no administration in time range)  potassium chloride (KLOR-CON) CR tablet 30 mEq (30 mEq Oral Refused 09/10/20 0900)  ziprasidone (GEODON) injection 20 mg (20 mg Intramuscular Given 09/10/20 0645)  sodium chloride 0.9 % bolus 1,000 mL (0 mLs Intravenous Stopped 09/10/20 0805)  ondansetron (ZOFRAN) injection 4 mg (4 mg Intravenous Given 09/10/20 0654)  pantoprazole (PROTONIX) injection 40 mg (40 mg Intravenous Given 09/10/20 0654)  alum & mag hydroxide-simeth (MAALOX/MYLANTA) 200-200-20 MG/5ML suspension 30 mL (30 mLs Oral Given 09/10/20 0804)    And  lidocaine (XYLOCAINE) 2 % viscous mouth solution 15 mL (15 mLs Oral Given 09/10/20 0804)  lactated ringers bolus 1,000 mL (0 mLs Intravenous Stopped 09/10/20 1142)  sodium chloride (PF) 0.9 % injection (  Given 09/10/20 0859)  promethazine (PHENERGAN) injection 12.5 mg (12.5 mg Intravenous Given 09/10/20 0836)  iohexol (OMNIPAQUE) 300 MG/ML solution 100 mL (100 mLs Intravenous Contrast Given 09/10/20 0841)   ED Course  I have reviewed the triage vital signs and the nursing notes.  Pertinent labs & imaging results that were available during my care of the patient were reviewed by me and considered in my medical decision making (see chart for  details).  Clinical Course as of Sep 10 1229  Fri Sep 10, 2020  0800 WBC(!): 15.2 [LJ]  0802 NEUT#(!): 7.8 [LJ]  0802 Anion gap(!): 20 [LJ]  0802 AST(!): 98 [LJ]  0802 ALT(!): 168 [LJ]  0838 Tetrahydrocannabinol(!): POSITIVE [LJ]  0838 Given Klor-Con.  Potassium(!): 3.4 [LJ]  0839 Given 2 L of IV fluids.  Anion gap(!): 20 [LJ]  0956 Alcohol, Ethyl (B)(!): 78 [LJ]  1040 1. No bowel wall thickening or bowel obstruction. No abscess in the abdomen or pelvis. Appendix appears normal. Note that rectum is mildly distended with stool.  2. Mass in the right lobe of the liver measuring 2.4 x 2.0 cm of uncertain etiology. This lesion does not show classic hemangioma changes on current examination. This finding warrants pre and serial post-contrast MR or CT of the liver to further assess.  3. No evident renal or ureteral calculus. No hydronephrosis. Urinary bladder wall thickness normal.   [LJ]    Clinical Course User Index [LJ] Placido SouJoldersma, Aubrionna Istre, PA-C   MDM Rules/Calculators/A&P                          Pt is a 24 y.o. male that presents with a history, physical exam, and ED Clinical Course as noted above.   Patient presents today with upper abdominal pain as well as alcohol intoxication.  Patient was initially combative and screaming at staff and the overnight provider gave patient IM Geodon as well as Zofran and Protonix.  On my reassessment patient was calm and noted moderate relief of his symptoms.  He was still experiencing nausea and was given additional Phenergan as well as a GI cocktail.  Basic labs were obtained showing  a leukocytosis of 15.2 with a neutrophilia of 7.8, likely reactive.  UDS positive for THC.  CMP with an anion gap of 20.  Patient was given 2 L of IV fluids.  Hypokalemia of 3.4 which was repleted with Klor-Con as well as LR.  Elevated AST at 98 with an ALT of 168.  Patient had similar LFTs on previous CMP's.  Ethanol noted to be elevated at 78.  No elevation in  lipase.  I obtained a CT scan of the abdomen and pelvis with findings as noted above.  Radiology noted a mass on the right lobe of the liver measuring 2.4 x 2 cm of uncertain etiology.  They do recommend follow-up imaging.  This was discussed in length with the patient.  He has been given follow-up with [facility] as well as [facility] gastro[facility]ology.  He is planning on calling them.  Patient was initially p.o. challenged and failed.  He was then monitored for 2-3 more additional hours in the emergency department and has since had p.o. intake without any further nausea and vomiting.  Patient is now ambulatory and states he feels much better and is eager to be discharged.  He appears clinically sober.  We discussed his alcohol use in length and I also placed an ambulatory referral to peer support.  He was given strict return precautions.  We will start him on a short course of Pepcid.  Urged patient to follow-up with [place] and wellness as well as gastroenterology.  He understands he can return to the ER with any new or worsening symptoms.  His questions were answered and he was amicable at the time of discharge.  He was mildly hypertensive at today's visit with his most recent blood pressure being 147/56 but patient also appeared anxious throughout his stay today.  Otherwise, his vital signs have been stable.  He is afebrile, not tachycardic, not hypoxic.   An After Visit Summary was printed and given to the patient.  Patient discharged to home/self care.  Condition at discharge: Stable  Note: Portions of this report may have been transcribed using voice recognition software. Every effort was made to ensure accuracy; however, inadvertent computerized transcription errors may be present.   Final Clinical Impression(s) / ED Diagnoses Final diagnoses:  Non-intractable vomiting with nausea  Epigastric pain  Liver mass  Alcoholic intoxication with complication [facility])   Rx /  DC Orders ED Discharge Orders         Ordered    Consult to Peer Support       Provider:  (Not yet assigned)   09/10/20 0842    famotidine (PEPCID) 20 MG tablet  2 times daily        09/10/20 1222           Placido Sou, PA-C 09/10/20 1230    Virgina Norfolk, DO 09/10/20 1535

## 2020-09-10 NOTE — ED Notes (Signed)
Pt began to vomit after PO challenge. Complaining of nausea and states "I don't think I can get out of bed"

## 2020-09-10 NOTE — ED Notes (Signed)
Pt. From home via ems  C/o abd pain all over and vomiting blood x 3 hrs .

## 2020-09-10 NOTE — ED Triage Notes (Addendum)
Pt arrives to ER via EMS after drinking 3 bottles of wine and 2 40oz of beer. Pt reports vomiting blood in bathroom. EMS denies any blood. Pt reports vomiting blood at hospital. No blood visible. Pt yelling and cursing at staff members. C/o abdominal pain .

## 2020-09-13 ENCOUNTER — Telehealth: Payer: Self-pay

## 2020-09-13 NOTE — ED Notes (Signed)
Pt called with questions regarding his referral. Clarified with him to make an appointment with [facility].

## 2020-09-13 NOTE — Telephone Encounter (Signed)
Copied from CRM [id number]. Topic: General - Other >> Sep 13, 2020 11:45 AM Marylen Ponto wrote: Reason for CRM: Pt requests an appt for financial assistance.

## 2020-09-16 ENCOUNTER — Encounter (HOSPITAL_COMMUNITY): Payer: Self-pay | Admitting: Emergency Medicine

## 2020-09-16 ENCOUNTER — Emergency Department (HOSPITAL_COMMUNITY)
Admission: EM | Admit: 2020-09-16 | Discharge: 2020-09-16 | Disposition: A | Discharge status: Home / Self Care | Payer: Medicaid Other | Attending: Emergency Medicine | Admitting: Emergency Medicine

## 2020-09-16 ENCOUNTER — Other Ambulatory Visit: Payer: Self-pay

## 2020-09-16 DIAGNOSIS — K769 Liver disease, unspecified: Secondary | ICD-10-CM | POA: Insufficient documentation

## 2020-09-16 DIAGNOSIS — F17210 Nicotine dependence, cigarettes, uncomplicated: Secondary | ICD-10-CM | POA: Insufficient documentation

## 2020-09-16 DIAGNOSIS — R1013 Epigastric pain: Secondary | ICD-10-CM

## 2020-09-16 DIAGNOSIS — R110 Nausea: Secondary | ICD-10-CM | POA: Insufficient documentation

## 2020-09-16 DIAGNOSIS — R160 Hepatomegaly, not elsewhere classified: Secondary | ICD-10-CM

## 2020-09-16 LAB — COMPREHENSIVE METABOLIC PANEL
ALT: 124 U/L — ABNORMAL HIGH (ref 0–44)
AST: 58 U/L — ABNORMAL HIGH (ref 15–41)
Albumin: 4.7 g/dL (ref 3.5–5.0)
Alkaline Phosphatase: 56 U/L (ref 38–126)
Anion gap: 11 (ref 5–15)
BUN: 13 mg/dL (ref 6–20)
CO2: 23 mmol/L (ref 22–32)
Calcium: 9.3 mg/dL (ref 8.9–10.3)
Chloride: 104 mmol/L (ref 98–111)
Creatinine, Ser: 1.23 mg/dL (ref 0.61–1.24)
GFR, Estimated: >60 mL/min (ref >60)
Glucose, Bld: 106 mg/dL — ABNORMAL HIGH (ref 70–99)
Potassium: 3.6 mmol/L (ref 3.5–5.1)
Sodium: 138 mmol/L (ref 135–145)
Total Bilirubin: 0.6 mg/dL (ref 0.3–1.2)
Total Protein: 7.9 g/dL (ref 6.5–8.1)

## 2020-09-16 LAB — CBC
HCT: 47.8 % (ref 39.0–52.0)
Hemoglobin: 16 g/dL (ref 13.0–17.0)
MCH: 30.3 pg (ref 26.0–34.0)
MCHC: 33.5 g/dL (ref 30.0–36.0)
MCV: 90.5 fL (ref 80.0–100.0)
Platelets: 283 10*3/uL (ref 150–400)
RBC: 5.28 MIL/uL (ref 4.22–5.81)
RDW: 12.2 % (ref 11.5–15.5)
WBC: 9.8 10*3/uL (ref 4.0–10.5)
nRBC: 0 % (ref 0.0–0.2)

## 2020-09-16 LAB — LIPASE, BLOOD: Lipase: 30 U/L (ref 11–51)

## 2020-09-16 NOTE — Discharge Instructions (Addendum)
You have a hepatitis panel and a HIV panel pending. If there are abnormal results, you will be contacted. You can always check on mychart as well.  I have provided you another GI referral in [place]. Please contact their office to arrange for an appointment.   It is also very important that you follow-up with [facility] as you will need to establish a primary care doctor.  Return to the Emergency Dept for any worsening pain, vomiting blood, fevers, or any other worsening or concenring symptoms.

## 2020-09-16 NOTE — ED Triage Notes (Signed)
Pt reports emesis and abdominal pain that started last night. Pt reports taking Zofran with minimal relief.

## 2020-09-16 NOTE — ED Provider Notes (Signed)
[name] Provider Note   CSN: [id number] Arrival date & time: 09/16/20  1631     History Chief Complaint  Patient presents with  . Emesis    [name] is a 24 y.o. male past 1 history of substance abuse, major depressive disorder who presents to today for evaluation of intermittent abdominal pain and concern from previous liver mass.  Patient was seen in the ED on 09/10/2020 for evaluation of hematemesis, abdominal pain.  He had a work-up at that time.  He was intoxicated and required Geodon.  His work-up revealed a liver mass and he was discharged with instructions to follow-up with [facility] as well as [facility].  Patient states that he was still drowsy when the previous provider was talking to him he did not understand all that was going on.  He tried calling the GI doctors office and was unable to get in with them.  He is concerned about not being able to follow-up with GI and is concerned about what this diagnosis of a liver mass means.  He states he has had intermittent abdominal pain particularly in the epigastric region over the last week or so.  Currently denies any pain.  He states he has not really had any more vomiting.  He will occasionally have some episodes of nausea.  He has not noted any fevers.  He states he has not drank any alcohol since he was seen here last week.  He denies any substance abuse.  He does report he has a history of IV drug use and states he last used about 3 months.  He has not noted any fevers but states he occasionally will have some night sweats.  Denies any weight loss.  No chest pain, difficulty breathing.  The history is provided by the patient.       Past Medical History:  Diagnosis Date  . Acne nodule    on going  . Aggressive behavior 04/13/2019  . OD (overdose of drug), intentional self-harm, initial encounter (HCC) 04/13/2019  . Substance abuse (HCC) 04/13/2019  . Suicide attempt (HCC) 04/13/2019     Patient Active Problem List   Diagnosis Date Noted  . Chronic post-traumatic stress disorder (PTSD) 03/29/2019  . Major depressive disorder, recurrent episode, moderate (HCC) 03/29/2019  . Generalized anxiety disorder 11/23/2018  . Unspecified episodic mood disorder 08/05/2014  . Polysubstance abuse (HCC) 08/05/2014  . Suicidal ideation 08/04/2014    History reviewed. No pertinent surgical history.     Family History  Problem Relation Age of Onset  . Bipolar disorder Mother     Social History   Tobacco Use  . Smoking status: Current Every Day Smoker    Packs/day: 1.00    Years: 5.00    Pack years: 5.00    Types: Cigarettes  . Smokeless tobacco: Never Used  Vaping Use  . Vaping Use: Never used  Substance Use Topics  . Alcohol use: Yes    Alcohol/week: 6.0 standard drinks    Types: 6 Shots of liquor per week    Comment: daily drinker  . Drug use: Yes    Types: Marijuana, Oxycodone, Methamphetamines    Comment: heroin     Home Medications Prior to Admission medications   Medication Sig Start Date End Date Taking? Authorizing Provider  amphetamine-dextroamphetamine (ADDERALL) 10 MG tablet Take 20 mg by mouth 2 (two) times a day.    [provider]  cephALEXin (KEFLEX) 500 MG capsule Take 1 capsule (  500 mg total) by mouth 2 (two) times daily. 04/13/19   Lawyer, Cristal Deer, PA-C  clonazePAM (KLONOPIN) 1 MG tablet Take 1 mg by mouth 2 (two) times daily.    [provider]  doxycycline (VIBRAMYCIN) 100 MG capsule Take 1 capsule (100 mg total) by mouth 2 (two) times daily. 04/13/19   Lawyer, Cristal Deer, PA-C  famotidine (PEPCID) 20 MG tablet Take 1 tablet (20 mg total) by mouth 2 (two) times daily. 09/10/20   Placido Sou, PA-C  gabapentin (NEURONTIN) 300 MG capsule Take 1 capsule (300 mg total) by mouth 3 (three) times daily. 03/29/19   Charm Rings, NP  hydrOXYzine (ATARAX/VISTARIL) 25 MG tablet Take 1 tablet (25 mg total) by mouth every 6 (six)  hours as needed for anxiety. Patient not taking: Reported on 04/13/2019 03/29/19   Charm Rings, NP  Multiple Vitamin (MULTIVITAMIN WITH MINERALS) TABS tablet Take 1 tablet by mouth daily.    [provider]  ondansetron (ZOFRAN) 4 MG tablet Take 1 tablet (4 mg total) by mouth every 8 (eight) hours as needed for nausea or vomiting. 09/10/20   Placido Sou, PA-C  prazosin (MINIPRESS) 2 MG capsule Take 1 capsule (2 mg total) by mouth at bedtime. Patient not taking: Reported on 04/13/2019 03/29/19   Charm Rings, NP  traZODone (DESYREL) 50 MG tablet Take 1 tablet (50 mg total) by mouth at bedtime and may repeat dose one time if needed. Patient not taking: Reported on 04/13/2019 03/29/19   Charm Rings, NP    Allergies    Acetaminophen and Benzonatate  Review of Systems   Review of Systems  Constitutional: Negative for fever.  Respiratory: Negative for cough and shortness of breath.   Cardiovascular: Negative for chest pain and palpitations.  Gastrointestinal: Positive for abdominal pain and nausea. Negative for vomiting.  Genitourinary: Negative for dysuria and hematuria.  Neurological: Negative for headaches.  All other systems reviewed and are negative.   Physical Exam Updated Vital Signs BP 120/77 (BP Location: Right Arm)   Pulse 66   Temp 98.7 F (37.1 C) (Oral)   Resp 20   SpO2 99%   Physical Exam Vitals and nursing note reviewed.  Constitutional:      Appearance: Normal appearance. He is well-developed.  HENT:     Head: Normocephalic and atraumatic.  Eyes:     General: Lids are normal.     Conjunctiva/sclera: Conjunctivae normal.     Pupils: Pupils are equal, round, and reactive to light.  Cardiovascular:     Rate and Rhythm: Normal rate and regular rhythm.     Pulses: Normal pulses.     Heart sounds: Normal heart sounds. No murmur heard.  No friction rub. No gallop.   Pulmonary:     Effort: Pulmonary effort is normal.     Breath sounds: Normal  breath sounds.     Comments: Lungs clear to auscultation bilaterally.  Symmetric chest rise.  No wheezing, rales, rhonchi. Abdominal:     Palpations: Abdomen is soft. Abdomen is not rigid.     Tenderness: There is abdominal tenderness in the right upper quadrant and epigastric area. There is no guarding.     Comments: Abdomen soft, nondistended.  Mild tenderness in epigastric and right upper quadrant region.  No rigidity, guarding.  No CVA tenderness.  Musculoskeletal:        General: Normal range of motion.     Cervical back: Full passive range of motion without pain.  Skin:  General: Skin is warm and dry.     Capillary Refill: Capillary refill takes less than 2 seconds.  Neurological:     Mental Status: He is alert and oriented to person, place, and time.  Psychiatric:        Speech: Speech normal.     ED Results / Procedures / Treatments   Labs (all labs ordered are listed, but only abnormal results are displayed) Labs Reviewed  COMPREHENSIVE METABOLIC PANEL - Abnormal; Notable for the following components:      Result Value   Glucose, Bld 106 (*)    AST 58 (*)    ALT 124 (*)    All other components within normal limits  LIPASE, BLOOD  CBC  URINALYSIS, ROUTINE W REFLEX MICROSCOPIC  HEPATITIS PANEL, ACUTE  HIV ANTIBODY (ROUTINE TESTING W REFLEX)    EKG None  Radiology No results found.  Procedures Procedures (including critical care time)  Medications Ordered in ED Medications - No data to display  ED Course  I have reviewed the triage vital signs and the nursing notes.  Pertinent labs & imaging results that were available during my care of the patient were reviewed by me and considered in my medical decision making (see chart for details).    MDM Rules/Calculators/A&P                          24 year old male who presents for evaluation of history of liver mass, intermittent abdominal pain.  He was seen here in the ED 1 week ago and was evaluated for  symptoms.  At that time, his work-up showed slight leukocytosis, elevation in his LFTs.  He also was noted to have a liver mass on a CT scan that recommended further outpatient imaging.  He states that he was still groggy when the a previous provider talk to him and he does not know what this all entails.  He is concerned that he would not be able to get follow-up and so he came to the emergency [name] to see if he can get more answers.  Initially arrival, he is afebrile, nontoxic-appearing.  Vital signs are stable.  He is some mild tenderness in epigastric and right upper quadrant region.  No rigidity, guarding.  Lab work ordered at triage.  Lipase unremarkable.  CBC shows no leukocytosis or anemia.  CMP shows BUN/creatinine within normal limits.  AST is elevated at 58, ALT of 124.  Review of his blood work from previous visit shows his leukocytosis has improved.  LFTs have improved slightly though they are still up.  I had a long discussion with both dad and patient regarding his work-up done on 09/10/2020.  We reviewed his blood work as well as a CT scan.  I discussed with him the findings on CT scan.  I discussed with both dad and patient that this requires further GI evaluation.  At this time, his lab work is stable.  He states he is not currently having abdominal pain.  He states it will intermittently, but he has not had any more episodes of vomiting or hematemesis.  I discussed with him at this time, I do not feel that repeat imaging is warranted given his hemodynamically stability here in the ED as well as his reassuring work-up.  He states he has had some intermittent pain over the last few days since his work-up about a week ago.  He does have some very mild tenderness on exam but  otherwise states that he mostly came because he needs to get follow-up.  His exam is otherwise reassuring and given his reassuring lab work, I do not feel that the CT would warrant new information do not suspect surgical  abdomen.  I did discuss this with patient and dad and they are both agreeable to hold off on a CT at this time.  He needs to see GI for further evaluation of his abnormality seen in his blood work.  Dad is requesting a new GI referral particularly in [place].  We will refer him to [facility] who is on-call today.  I also discussed with patient regarding his history of substance abuse.  Given his elevations LFTs, we will add a hepatitis panel as well as HIV.  I discussed at length with the dad and patient and they are agreeable to plan. At this time, patient exhibits no emergent life-threatening condition that require further evaluation in ED. Patient had ample opportunity for questions and discussion. All patient's questions were answered with full understanding. Strict return precautions discussed. Patient expresses understanding and agreement to plan.   Portions of this note were generated with Scientist, clinical (histocompatibility and immunogenetics). Dictation errors may occur despite best attempts at proofreading.    Final Clinical Impression(s) / ED Diagnoses Final diagnoses:  Liver mass  Epigastric pain    Rx / DC Orders ED Discharge Orders    None       Rosana Hoes 09/17/20 0038    Bethann Berkshire, MD 09/17/20 2235

## 2020-09-17 LAB — HEPATITIS PANEL, ACUTE
HCV Ab: REACTIVE — AB
Hep A IgM: NONREACTIVE
Hep B C IgM: NONREACTIVE
Hepatitis B Surface Ag: NONREACTIVE

## 2020-09-17 LAB — HIV ANTIBODY (ROUTINE TESTING W REFLEX): HIV Screen 4th Generation wRfx: NONREACTIVE

## 2020-09-20 ENCOUNTER — Telehealth: Payer: Self-pay

## 2020-09-20 NOTE — Telephone Encounter (Signed)
Ok to schedule ov.  

## 2020-09-20 NOTE — Telephone Encounter (Signed)
Can we accept patient as a new patient? He was seen in the ER. [phone]

## 2020-09-20 NOTE — Telephone Encounter (Signed)
Father also called. He said patient has a spot on his liver and has Hep C.

## 2020-09-20 NOTE — Telephone Encounter (Signed)
I spoke to father and patient. Both are aware of OV next Tuesday at 0930 with [facility]

## 2020-09-28 ENCOUNTER — Encounter: Payer: Self-pay | Admitting: *Deleted

## 2020-09-28 ENCOUNTER — Other Ambulatory Visit: Payer: Self-pay

## 2020-09-28 ENCOUNTER — Telehealth: Payer: Self-pay

## 2020-09-28 ENCOUNTER — Other Ambulatory Visit (HOSPITAL_COMMUNITY)
Admission: RE | Admit: 2020-09-28 | Discharge: 2020-09-28 | Disposition: A | Discharge status: Home / Self Care | Payer: Medicaid Other | Source: Ambulatory Visit | Attending: Gastroenterology | Admitting: Gastroenterology

## 2020-09-28 ENCOUNTER — Encounter: Payer: Self-pay | Admitting: Gastroenterology

## 2020-09-28 ENCOUNTER — Ambulatory Visit (INDEPENDENT_AMBULATORY_CARE_PROVIDER_SITE_OTHER): Payer: Self-pay | Admitting: Gastroenterology

## 2020-09-28 VITALS — BP 121/75 | HR 45 | Temp 97.5°F | Ht 75.0 in | Wt 194.4 lb

## 2020-09-28 DIAGNOSIS — R945 Abnormal results of liver function studies: Secondary | ICD-10-CM

## 2020-09-28 DIAGNOSIS — R768 Other specified abnormal immunological findings in serum: Secondary | ICD-10-CM | POA: Insufficient documentation

## 2020-09-28 DIAGNOSIS — R160 Hepatomegaly, not elsewhere classified: Secondary | ICD-10-CM | POA: Insufficient documentation

## 2020-09-28 DIAGNOSIS — R7989 Other specified abnormal findings of blood chemistry: Secondary | ICD-10-CM

## 2020-09-28 DIAGNOSIS — R7689 Other specified abnormal immunological findings in serum: Secondary | ICD-10-CM | POA: Insufficient documentation

## 2020-09-28 DIAGNOSIS — K769 Liver disease, unspecified: Secondary | ICD-10-CM

## 2020-09-28 LAB — PROTIME-INR
INR: 1.1 (ref 0.8–1.2)
Prothrombin Time: 13.4 seconds (ref 11.4–15.2)

## 2020-09-28 LAB — COMPREHENSIVE METABOLIC PANEL
ALT: 118 U/L — ABNORMAL HIGH (ref 0–44)
AST: 55 U/L — ABNORMAL HIGH (ref 15–41)
Albumin: 4 g/dL (ref 3.5–5.0)
Alkaline Phosphatase: 50 U/L (ref 38–126)
Anion gap: 7 (ref 5–15)
BUN: 11 mg/dL (ref 6–20)
CO2: 25 mmol/L (ref 22–32)
Calcium: 9.1 mg/dL (ref 8.9–10.3)
Chloride: 106 mmol/L (ref 98–111)
Creatinine, Ser: 1.19 mg/dL (ref 0.61–1.24)
GFR, Estimated: >60 mL/min (ref >60)
Glucose, Bld: 91 mg/dL (ref 70–99)
Potassium: 4 mmol/L (ref 3.5–5.1)
Sodium: 138 mmol/L (ref 135–145)
Total Bilirubin: 0.4 mg/dL (ref 0.3–1.2)
Total Protein: 6.9 g/dL (ref 6.5–8.1)

## 2020-09-28 NOTE — Patient Instructions (Signed)
1. Please have labs and MRI. We will contact you with results. 2. Please continue to abstain from alcohol and drugs.  3. Avoid sharing nail clippers, razors, toothbrushes, needles as this is a means of spreading Hepatitis C.    Hepatitis C Hepatitis C is a liver infection that is caused by the hepatitis C virus (HCV). The virus infects and causes inflammation in the liver. Hepatitis C can lead to:  A condition where the liver cannot work anymore (liver failure).  Scarring of the liver (cirrhosis).  Liver cancer. People with hepatitis C often do not know for months or years that they have it. This is because they often do not have symptoms or may have only mild symptoms. What are the causes? This condition is caused by HCV. The virus can spread from person to person (is contagious) through:  Contact with an infected person's blood, semen, or vaginal fluids.  Childbirth. A woman who has hepatitis C can pass it to her baby during birth.  Donated blood (blood transfusion) or a donated body organ (organ transplantation) if received in the [place] before 1992. What increases the risk? The following factors may make you more likely to develop this condition:  Having contact with needles or syringes that have HCV on them (are contaminated). Contact may happen while: ? Receiving acupuncture. ? Getting a tattoo. ? Getting a body piercing. ? Injecting drugs.  Having sex with someone who is infected. The virus can spread through vaginal, oral, or anal sex.  Receiving treatment to filter your blood (kidney dialysis).  Having HIV (human immunodeficiency virus) or AIDS (acquired immunodeficiency syndrome).  Having a job that involves contact with blood or certain other body fluids, such as in health care. What are the signs or symptoms? Symptoms of this condition include:  Tiredness (fatigue).  Loss of appetite.  Nausea or vomiting.  Pain in your abdomen.  Dark yellow  urine.  Your skin or the white parts of your eyes turning yellow (jaundice).  Itchy skin.  Light-colored or tan stool.  Joint pain.  Bleeding and bruising that happen often.  Fluid building up in your stomach (ascites). Often, hepatitis C causes no symptoms. How is this diagnosed? This condition is diagnosed with:  Blood tests.  Other tests of how well your liver is working. These may include: ? Magnetic resonance elastography (MRE). This imaging test uses MRI and sound waves to measure liver stiffness. ? Transient elastography. This imaging test uses ultrasound to measure liver stiffness. ? Liver biopsy. This test involves taking a tissue sample from your liver to look at under a microscope. How is this treated? Treatment may depend on how severe your condition is, how long it has lasted, and whether you have liver damage. More testing may be done to figure out the best treatment. Treatment may include:  Taking antiviral medicines and other medicines.  Having follow-up treatments every 6-12 months for infections or other liver problems.  Having liver transplantation. Follow these instructions at home: Medicines  Take over-the-counter and prescription medicines only as told by your health care provider.  If you were prescribed an antiviral medicine, take it as told by your health care provider. Do not stop using the antiviral even if you start to feel better.  Do not take any new medicines, including over-the-counter medicines or supplements, unless your health care provider approves. Activity  Rest as needed.  Do not have sex unless approved by your health care provider.  Return to your normal  activities as told by your health care provider. Ask your health care provider what activities are safe for you.  Ask your health care provider when you may return to school or work. Eating and drinking   Eat a balanced diet with plenty of fruits and vegetables, whole grains,  and lean meats or non-meat proteins (such as beans or tofu).  Drink enough fluids to keep your urine pale yellow.  Do not drink alcohol. General instructions  Do not share toothbrushes, nail clippers, or razors.  Wash your hands often with soap and water for at least 20 seconds. If soap and water are not available, use hand sanitizer.  Cover any cuts or open sores on your skin to prevent spreading HCV.  Avoid swimming or using hot tubs if you have open sores or wounds.  Keep all follow-up visits as told by your health care provider. This is important. You may need follow-up visits every 6-12 months. How is this prevented? There is no vaccine for hepatitis C. The only way to prevent this infection is to lessen your risk of coming into contact with HCV. Make sure you:  Wash your hands often with soap and water for at least 20 seconds.  Do not share needles or syringes.  Use a condom every time you have vaginal, oral, or anal sex. Be sure to use it correctly each time. ? Both females and males should wear condoms. ? Condoms should be kept in place from the beginning to the end of sexual activity. ? Latex condoms should be used, if possible. These offer the best protection.  Avoid handling blood or other body fluids without gloves or other protection.  Avoid getting tattoos or body piercings in shops or other places that are not clean. Where to find more information  Centers for Disease Control and Prevention: SaveSearches.co.nz Contact a health care provider if you:  Have a fever.  Have pain in your abdomen.  Pass dark urine.  Pass light-colored or tan stool.  Have joint pain. Get help right away if you:  Have an increase in fatigue or weakness.  Lose your appetite.  Cannot eat or drink without vomiting.  Develop jaundice or your jaundice gets worse.  Bruise or bleed easily. Summary  Hepatitis C is a liver infection that is caused by the hepatitis C virus  (HCV). This infection can lead to a condition where the liver cannot work anymore (liver failure), scarring of the liver (cirrhosis), or liver cancer.  HCV causes this condition and can spread from person to person (is contagious).  Do not take any medicines, including over-the-counter medicines or supplements, unless your health care provider approves. This information is not intended to replace advice given to you by your health care provider. Make sure you discuss any questions you have with your health care provider. Document Revised: 07/23/2019 Document Reviewed: 06/30/2019 Elsevier Patient Education  2020 [employer].

## 2020-09-28 NOTE — Telephone Encounter (Signed)
PHONED PT AND HIS PHONE IS NOT ACCEPTING CALLS AT THIS TIME

## 2020-09-28 NOTE — Telephone Encounter (Signed)
Did he go to [facility] as suggested? I was not aware that they are requesting money up front.

## 2020-09-28 NOTE — Telephone Encounter (Signed)
FYI; PT JUST CALLED BACK TO THE OFFICE STATING HE WAS AT FACILITY TO GET LABS DRAWN BUT THEY WANTED HIM TO PAY HALF THE MONEY UP FRONT AND HE DOESN'T HAVE IT. STATES HE HAS NO MONEY TO GET THESE LABS DONE. WILL HAVE THE LABS DONE WHEN HE CAN AFFORD IT.

## 2020-09-28 NOTE — Progress Notes (Signed)
Primary Care Physician:  Curly Rim, MD  Primary Gastroenterologist:  Garfield Cornea, MD   Chief Complaint  Patient presents with  . Emesis    went to ED-vomited blood; better now  . Hepatitis C    HPI:  John [name] is a 24 y.o. male with history of polysubstance abuse, major depressive disorder, previous suicide attempt presenting for further evaluation of positive hepatitis C antibody and liver mass.  Seen in the ED recently.  Labs from September 16, 2020 with positive hepatitis C antibody.  Hepatitis B core IgM negative, hepatitis A IgM negative, hepatitis B surface antigen negative.  HIV negative.  White blood cell count 9800, hemoglobin 16, platelets 283,000, sodium 138, BUN 13, creatinine 1.23, total bilirubin 0.6, alk phos 56, AST 58, ALT 124, albumin 4.7, lipase 30.  Labs from October 29 with urine drug screen positive for THC.  AST 98, ALT 168. Previous urine drug screen in 11/2018 positive for cocaine. In 03/2019 positive for benzos/amphetamines.  CT abdomen pelvis with contrast September 10, 2020: Focal area of decreased attenuation in the right lobe of the liver near the dome measuring 2.4 x 2 cm.  Not classic hemangioma.  Recommend MR or CT of the liver.  SARS coronavirus 2+ March 2021.  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 He states he hit rock bottom last year. Was using daily for 4-5 months. Currently he denies drugs (except THC) for past 4-5 months. He states that he somewhat substituted his meth addiction with etoh and let it get out of hand. Recently seen in ED due to hematemesis. States he had dinner and few glasses of wine prior to onset of hematemesis. Noted blood in first emesis. He has had no etoh since seen in the ED 09/10/20. He was intoxicated while in the ED and required IM Geodon. According to the ED note, EMS reported that he drank 3 bottles of wine and 40 ounces of beer. They states that  there was no visible blood in the home or on patient and there was no hematemesis in the emesis while in the ED.   Patient states that he has had no etoh since 09/10/20. He has had no further vomiting. No real abdominal pain. Eating healthy. No weight loss. BMs regular. No melena, brbpr. Lives with grandmother. States he missed work or was late a lot and was fired. Starts new job at [employer] next week.    Current Outpatient Medications  Medication Sig Dispense Refill  . Multiple Vitamin (MULTIVITAMIN WITH MINERALS) TABS tablet Take 1 tablet by mouth daily.     No current facility-administered medications for this visit.    Allergies as of 09/28/2020 - Review Complete 09/28/2020  Allergen Reaction Noted  . Acetaminophen Rash 09/04/2015  . Benzonatate Other (See Comments) 07/27/2018    Past Medical History:  Diagnosis Date  . Acne nodule    on going  . Aggressive behavior 04/13/2019  . OD (overdose of drug), intentional self-harm, initial encounter ([place]) 04/13/2019  . Substance abuse ([place]) 04/13/2019  . Suicide attempt ([place]) 04/13/2019    Past Surgical History:  Procedure Laterality Date  . ORIF FINGER / THUMB FRACTURE      Family History  Problem Relation Age of Onset  . Bipolar disorder Mother   . Liver disease Maternal Grandfather        etoh  . Colon cancer Neg Hx     Social History   Socioeconomic History  .  Marital status: Single    Spouse name: Not on file  . Number of children: Not on file  . Years of education: Not on file  . Highest education level: Not on file  Occupational History  . Not on file  Tobacco Use  . Smoking status: Current Every Day Smoker    Packs/day: 1.00    Years: 5.00    Pack years: 5.00    Types: Cigarettes  . Smokeless tobacco: Never Used  Vaping Use  . Vaping Use: Never used  Substance and Sexual Activity  . Alcohol use: Yes    Alcohol/week: 6.0 standard drinks    Types: 6 Shots of liquor per week    Comment: history of  heavy alcohol use; trying to cut back 09/28/20  . Drug use: Not Currently    Types: Marijuana, Oxycodone, Methamphetamines    Comment: heroin; denied 09/28/20  . Sexual activity: Yes    Birth control/protection: Condom  Other Topics Concern  . Not on file  Social History Narrative  . Not on file   Social Determinants of Health   Financial Resource Strain:   . Difficulty of Paying Living Expenses: Not on file  Food Insecurity:   . Worried About Charity fundraiser in the Last Year: Not on file  . Ran Out of Food in the Last Year: Not on file  Transportation Needs:   . Lack of Transportation (Medical): Not on file  . Lack of Transportation (Non-Medical): Not on file  Physical Activity:   . Days of Exercise per Week: Not on file  . Minutes of Exercise per Session: Not on file  Stress:   . Feeling of Stress : Not on file  Social Connections:   . Frequency of Communication with Friends and Family: Not on file  . Frequency of Social Gatherings with Friends and Family: Not on file  . Attends Religious Services: Not on file  . Active Member of Clubs or Organizations: Not on file  . Attends Archivist Meetings: Not on file  . Marital Status: Not on file  Intimate Partner Violence:   . Fear of Current or Ex-Partner: Not on file  . Emotionally Abused: Not on file  . Physically Abused: Not on file  . Sexually Abused: Not on file      ROS:  General: Negative for anorexia, weight loss, fever, chills, fatigue, weakness. Eyes: Negative for vision changes.  ENT: Negative for hoarseness, difficulty swallowing , nasal congestion. CV: Negative for chest pain, angina, palpitations, dyspnea on exertion, peripheral edema.  Respiratory: Negative for dyspnea at rest, dyspnea on exertion, cough, sputum, wheezing.  GI: See history of present illness. GU:  Negative for dysuria, hematuria, urinary incontinence, urinary frequency, nocturnal urination.  MS: Negative for joint pain, low  back pain.  Derm: Negative for rash or itching.  Neuro: Negative for weakness, abnormal sensation, seizure, frequent headaches, memory loss, confusion.  Psych: Negative for anxiety, depression, suicidal ideation, hallucinations.  Endo: Negative for unusual weight change.  Heme: Negative for bruising or bleeding. Allergy: Negative for rash or hives.    Physical Examination:  BP 121/75   Pulse (!) 45   Temp (!) 97.5 F (36.4 C) (Temporal)   Ht '6\' 3"'  (1.905 m)   Wt 194 lb 6.4 oz (88.2 kg)   BMI 24.30 kg/m    General: Well-nourished, well-developed in no acute distress. Polite. Accompanied by father.  Head: Normocephalic, atraumatic.   Eyes: Conjunctiva pink, no icterus. Mouth:masked. Neck: Supple  without thyromegaly, masses, or lymphadenopathy.  Lungs: Clear to auscultation bilaterally.  Heart: Regular rate and rhythm, no murmurs rubs or gallops.  Abdomen: Bowel sounds are normal, nontender, nondistended, no hepatosplenomegaly or masses, no abdominal bruits or    hernia , no rebound or guarding.   Rectal: not performed Extremities: No lower extremity edema. No clubbing or deformities.  Neuro: Alert and oriented x 4 , grossly normal neurologically.  Skin: Warm and dry, no rash or jaundice.   Psych: Alert and cooperative, normal mood and affect.  Labs: See hpi  Imaging Studies: CT ABDOMEN PELVIS W CONTRAST  Result Date: 09/10/2020 CLINICAL DATA:  Abdominal pain and hematemesis. Elevated white blood cell count. Recent binge of alcohol consumption EXAM: CT ABDOMEN AND PELVIS WITH CONTRAST TECHNIQUE: Multidetector CT imaging of the abdomen and pelvis was performed using the standard protocol following bolus administration of intravenous contrast. CONTRAST:  162m OMNIPAQUE IOHEXOL 300 MG/ML  SOLN COMPARISON:  January 15, 2013. FINDINGS: Lower chest: Lung bases are clear. Hepatobiliary: There is a focal area of decreased attenuation in the right lobe of the liver near the dome measuring  2.4 x 2.0 cm. No other focal liver lesion is evident. The gallbladder wall is not appreciably thickened. There is no biliary duct dilatation. Pancreas: There is no pancreatic mass or inflammatory focus. Spleen: No splenic lesions are evident. Adrenals/Urinary Tract: Adrenals bilaterally appear normal. Kidneys bilaterally show no evident mass or hydronephrosis on either side. There is no evident renal or ureteral calculus on either side. Urinary bladder is midline with wall thickness within normal limits. Stomach/Bowel: There is moderate stool throughout the colon. Note that the rectum is mildly distended with stool. There is no perirectal wall thickening. Elsewhere, there is no appreciable bowel wall thickening. No evident bowel obstruction. Terminal ileum appears unremarkable. There is no demonstrable free air or portal venous air. Vascular/Lymphatic: There is no abdominal aortic aneurysm. No arterial vascular lesions are evident. Major venous structures appear patent. There is no evident adenopathy in the abdomen or pelvis. Reproductive: Prostate and seminal vesicles are normal in size and contour. Other: Appendix appears normal. No abscess or ascites evident in the abdomen or pelvis. Musculoskeletal: No blastic or lytic bone lesions. No intramuscular or abdominal wall lesions are evident. IMPRESSION: 1. No bowel wall thickening or bowel obstruction. No abscess in the abdomen or pelvis. Appendix appears normal. Note that rectum is mildly distended with stool. 2. Mass in the right lobe of the liver measuring 2.4 x 2.0 cm of uncertain etiology. This lesion does not show classic hemangioma changes on current examination. This finding warrants pre and serial post-contrast MR or CT of the liver to further assess. 3. No evident renal or ureteral calculus. No hydronephrosis. Urinary bladder wall thickness normal. Electronically Signed   By: WLowella GripIII M.D.   On: 09/10/2020 09:04    Impression/Plan:  24 y/o male presenting for further evaluation of positive Hep C antibody, liver mass on CT. He also had recent ED evaluation for reported hematemesis in the setting of significant etoh consumption. Suspect trivial ugi bleeding ?M-W tear or etoh gastritis. These symptoms resolved after discontinuing alcohol and no need for further work up at this time.   Discussed at length with patient and father the likelihood that he does have chronic Hepatitis C given elevated AST/ALT. Ratio not c/w with etoh hepatitis and suspect elevated due to Hep C viremia. We will need to confirm with HCV RNA. His risk factors for hep C  are IV drug use. We discussed modes of transmission, natural history, and need to treat Hep C to prevent chronic liver disease/cirrhosis. We discussed the need to work up the liver mass seen on CT first. Will plan for MRI liver with and without contrast. If MRI liver with benign findings, then will move forward with Hep C treatment as appropriate pending labs. I stress importance of abstaining from drug and etoh use, the importance of medical compliance, and that he could get reinfected with Hep C after treatment IF he continued risky behaviors.   Plan for MRI liver with and without contrast.  Obtain labs.

## 2020-09-29 LAB — HEPATITIS B CORE ANTIBODY, TOTAL: Hep B Core Total Ab: NEGATIVE — AB

## 2020-09-30 ENCOUNTER — Ambulatory Visit: Payer: Medicaid Other | Admitting: Gastroenterology

## 2020-09-30 LAB — HCV RNA QUANT RFLX ULTRA OR GENOTYP
HCV RNA Qnt(log copy/mL): 6.601 log10 IU/mL
HepC Qn: 3990000 IU/mL

## 2020-09-30 LAB — HCV FIBROSURE
ALPHA 2-MACROGLOBULINS, QN: 164 mg/dL (ref 110–276)
ALT (SGPT) P5P: 146 IU/L — ABNORMAL HIGH (ref 0–55)
Apolipoprotein A-1: 108 mg/dL (ref 101–178)
Bilirubin, Total: 0.3 mg/dL (ref 0.0–1.2)
Fibrosis Score: 0.1 (ref 0.00–0.21)
GGT: 23 IU/L (ref 0–65)
Haptoglobin: 111 mg/dL (ref 17–317)
Necroinflammat Activity Score: 0.65 — ABNORMAL HIGH (ref 0.00–0.17)

## 2020-09-30 LAB — HEPATITIS C GENOTYPE

## 2020-09-30 NOTE — Telephone Encounter (Signed)
Phoned pt and his phone was not receiving calls at this time

## 2020-10-04 NOTE — Progress Notes (Signed)
Mr. Kantz, We have tried to reach you on several occasions regarding having your bloodwork done. Dr. Melvyn Neth was wondering if you went to [facility] to have this done. If not that is what she recommends. If you have any questions please call our office @ [phone].    Thank you, Westley Foots, CMA

## 2020-10-04 NOTE — Telephone Encounter (Signed)
Letter sent to pt

## 2020-10-08 ENCOUNTER — Ambulatory Visit (HOSPITAL_COMMUNITY)
Admission: RE | Admit: 2020-10-08 | Discharge: 2020-10-08 | Disposition: A | Discharge status: Home / Self Care | Payer: Self-pay | Source: Ambulatory Visit | Attending: Gastroenterology | Admitting: Gastroenterology

## 2020-10-08 ENCOUNTER — Other Ambulatory Visit: Payer: Self-pay

## 2020-10-08 DIAGNOSIS — K769 Liver disease, unspecified: Secondary | ICD-10-CM | POA: Insufficient documentation

## 2020-10-08 MED ORDER — GADOBUTROL 1 MMOL/ML IV SOLN
9.0000 mL | Freq: Once | INTRAVENOUS | Status: AC | PRN
  Administered 2020-10-08: 9 mL via INTRAVENOUS

## 2020-10-11 ENCOUNTER — Telehealth: Payer: Self-pay | Admitting: Internal Medicine

## 2020-10-11 NOTE — Telephone Encounter (Signed)
Patient called asking about his mri results and also said that he received a letter that we did not get his labs, he said he had them done at [facility] the day of his appointment here

## 2020-10-12 NOTE — Telephone Encounter (Signed)
Lmom, waiting on a return call.  

## 2020-10-14 ENCOUNTER — Encounter (INDEPENDENT_AMBULATORY_CARE_PROVIDER_SITE_OTHER): Payer: Self-pay | Admitting: Primary Care

## 2020-10-14 ENCOUNTER — Ambulatory Visit (INDEPENDENT_AMBULATORY_CARE_PROVIDER_SITE_OTHER): Payer: Self-pay | Admitting: Primary Care

## 2020-10-14 ENCOUNTER — Other Ambulatory Visit: Payer: Self-pay

## 2020-10-14 VITALS — BP 118/73 | HR 62 | Temp 98.4°F | Ht 75.0 in | Wt 189.8 lb

## 2020-10-14 DIAGNOSIS — R768 Other specified abnormal immunological findings in serum: Secondary | ICD-10-CM

## 2020-10-14 DIAGNOSIS — Z0000 Encounter for general adult medical examination without abnormal findings: Secondary | ICD-10-CM

## 2020-10-14 DIAGNOSIS — L700 Acne vulgaris: Secondary | ICD-10-CM

## 2020-10-14 NOTE — Progress Notes (Signed)
New Patient Office Visit  Subjective:  Patient ID: John [name], male    DOB: 1996-01-04  Age: 24 y.o. MRN: [id number]  CC:  Chief Complaint  Patient presents with  . New Patient (Initial Visit)    HPI John [name] is a 24 year old male who presents for establishment of care. Followed by GI for liver mass.  He voices no problems or concerns  Past Medical History:  Diagnosis Date  . Acne nodule    on going  . Aggressive behavior 04/13/2019  . OD (overdose of drug), intentional self-harm, initial encounter ([facility]) 04/13/2019  . Substance abuse ([facility]) 04/13/2019  . Suicide attempt ([facility]) 04/13/2019    Past Surgical History:  Procedure Laterality Date  . ORIF FINGER / THUMB FRACTURE      Family History  Problem Relation Age of Onset  . Bipolar disorder Mother   . Liver disease Maternal Grandfather        etoh  . Colon cancer Neg Hx     Social History   Socioeconomic History  . Marital status: Single    Spouse name: Not on file  . Number of children: Not on file  . Years of education: Not on file  . Highest education level: Not on file  Occupational History  . Not on file  Tobacco Use  . Smoking status: Current Every Day Smoker    Packs/day: 1.00    Years: 5.00    Pack years: 5.00    Types: Cigarettes  . Smokeless tobacco: Never Used  Vaping Use  . Vaping Use: Never used  Substance and Sexual Activity  . Alcohol use: Yes    Alcohol/week: 6.0 standard drinks    Types: 6 Shots of liquor per week    Comment: history of heavy alcohol use; trying to cut back 09/28/20  . Drug use: Not Currently    Types: Marijuana, Oxycodone, Methamphetamines    Comment: heroin; denied 09/28/20  . Sexual activity: Yes    Birth control/protection: Condom  Other Topics Concern  . Not on file  Social History Narrative  . Not on file   Social Determinants of Health   Financial Resource Strain:   . Difficulty of Paying Living Expenses: Not on file  Food Insecurity:     . Worried About Programme researcher, broadcasting/film/video in the Last Year: Not on file  . Ran Out of Food in the Last Year: Not on file  Transportation Needs:   . Lack of Transportation (Medical): Not on file  . Lack of Transportation (Non-Medical): Not on file  Physical Activity:   . Days of Exercise per Week: Not on file  . Minutes of Exercise per Session: Not on file  Stress:   . Feeling of Stress : Not on file  Social Connections:   . Frequency of Communication with Friends and Family: Not on file  . Frequency of Social Gatherings with Friends and Family: Not on file  . Attends Religious Services: Not on file  . Active Member of Clubs or Organizations: Not on file  . Attends Banker Meetings: Not on file  . Marital Status: Not on file  Intimate Partner Violence:   . Fear of Current or Ex-Partner: Not on file  . Emotionally Abused: Not on file  . Physically Abused: Not on file  . Sexually Abused: Not on file    ROS Review of Systems  Skin:       acne  All other systems reviewed  and are negative.   Objective:   Today's Vitals: BP 118/73 (BP Location: Right Arm, Patient Position: Sitting, Cuff Size: Normal)   Pulse 62   Temp 98.4 F (36.9 C) (Temporal)   Ht 6\' 3"  (1.905 m)   Wt 189 lb 12.8 oz (86.1 kg)   SpO2 97%   BMI 23.72 kg/m   Physical Exam Vitals reviewed.  Constitutional:      Appearance: Normal appearance. He is normal weight.  HENT:     Head: Normocephalic.     Right Ear: Tympanic membrane normal.     Left Ear: Tympanic membrane normal.     Nose: Nose normal.  Eyes:     Extraocular Movements: Extraocular movements intact.     Pupils: Pupils are equal, round, and reactive to light.  Cardiovascular:     Rate and Rhythm: Normal rate and regular rhythm.  Pulmonary:     Effort: Pulmonary effort is normal.     Breath sounds: Normal breath sounds.  Abdominal:     General: Abdomen is flat. Bowel sounds are normal.     Palpations: Abdomen is soft.   Musculoskeletal:        General: Normal range of motion.     Cervical back: Normal range of motion.  Skin:    General: Skin is warm and dry.     Findings: Erythema, lesion and rash present.  Neurological:     Mental Status: He is alert and oriented to person, place, and time.  Psychiatric:        Mood and Affect: Mood normal.        Behavior: Behavior normal.        Thought Content: Thought content normal.        Judgment: Judgment normal.     Assessment & Plan:  John [name] was seen today for new patient (initial visit).  Diagnoses and all orders for this visit:  Positive hepatitis C antibody test -     Consult to infectious diseases  Encounter for medical examination to establish care Establish care with new PCP  Cystic acne vulgaris      -     Ambulatory referral to Dermatology   Outpatient Encounter Medications as of 10/14/2020  Medication Sig  . Multiple Vitamin (MULTIVITAMIN WITH MINERALS) TABS tablet Take 1 tablet by mouth daily.   No facility-administered encounter medications on file as of 10/14/2020.    Follow-up: Return if symptoms worsen or fail to improve.   14/12/2019, NP

## 2020-10-14 NOTE — Patient Instructions (Signed)
Acne  Acne is a skin problem that causes small, red bumps (pimples) and other skin changes. The skin has tiny holes called pores. Each pore has an oil gland. Acne happens when the pores get blocked. The pores may become red, sore, and swollen. They may also become infected. Acne is common among teenagers. Acne usually goes away over time. What are the causes? This condition may be caused when:  Oil glands get blocked by oil, dead skin cells, and dirt.  Bacteria that live in the oil glands increase in number and cause infection. Acne can start with changes in hormones. These changes can occur:  When children mature into their teens (adolescence).  When women get their period (menstrual cycle).  When women are pregnant. Some things can make acne worse. They include:  Cosmetics and hair products that have oil in them.  Stress.  Diseases that cause changes in hormones.  Some medicines.  Headbands, backpacks, or shoulder pads.  Being near certain oils and chemicals.  Foods that are high in sugars. These include dairy products, sweets, and chocolates. What increases the risk? You are more likely to develop this condition if:  You are a teenager.  You have a family history of acne. What are the signs or symptoms? Symptoms of this condition include:  Small, red bumps (pimples or papules).  Whiteheads.  Blackheads.  Small, pus-filled pimples (pustules).  Big, red pimples or pustules that feel tender. Acne that is very bad can cause:  An abscess. This is an area that has pus.  Cysts. These are hard, painful sacs that have fluid.  Scars. These can happen after large pimples heal. How is this treated? Treatment for this condition depends on how bad your acne is. It may include:  Creams and lotions. These can: ? Keep the pores of your skin open. ? Prevent infections and swelling.  Medicines that treat infections (antibiotics). These can be put on your skin or taken  as pills.  Pills that decrease the amount of oil in your skin.  Birth control pills.  Light or laser treatments.  Shots of medicine into the areas with acne.  Chemicals that cause the skin to peel.  Surgery. Follow these instructions at home: Good skin care is the most important thing you can do to treat your acne. Take care of your skin as told by your doctor. You may be told to do these things:  Wash your skin gently at least two times each day. You should also wash your skin: ? After you exercise. ? Before you go to bed.  Use mild soap.  Use a water-based skin moisturizer after you wash your skin.  Use a sunscreen or sunblock with SPF 30 or greater. This is very important if you are using acne medicines.  Choose cosmetics that will not block your oil glands (are noncomedogenic). Medicines  Take over-the-counter and prescription medicines only as told by your doctor.  If you were prescribed an antibiotic medicine, use it or take it as told by your doctor. Do not stop using the antibiotic even if your acne gets better. General instructions  Keep your hair clean and off your face. Shampoo your hair on a regular basis. If you have oily hair, you may need to wash it every day.  Avoid wearing tight headbands or hats.  Avoid picking or squeezing your pimples. That can make your acne worse and cause it to scar.  Shave gently. Only shave when you have to.    Keep a food journal. This can help you see if any foods are linked to your acne.  Keep all follow-up visits as told by your doctor. This is important. Contact a doctor if:  Your acne is not better after eight weeks.  Your acne gets worse.  You have a large area of skin that is red or tender.  You think that you are having side effects from any acne medicine. Summary  Acne is a skin problem that causes pimples. Acne is common among teenagers. Acne usually goes away over time.  Acne starts with changes in your  hormones. Other causes include stress, diet, and some medicines.  Follow your doctor's instructions on how to take care of your skin. Good skin care is the most important thing you can do to treat your acne.  Take over-the-counter and prescription medicines only as told by your doctor.  Contact your doctor if you think that you are having side effects from any acne medicine. This information is not intended to replace advice given to you by your health care provider. Make sure you discuss any questions you have with your health care provider. Document Revised: 03/12/2018 Document Reviewed: 03/12/2018 Elsevier Patient Education  2020 [employer]  

## 2020-11-24 ENCOUNTER — Other Ambulatory Visit: Payer: Self-pay

## 2020-11-24 ENCOUNTER — Ambulatory Visit (INDEPENDENT_AMBULATORY_CARE_PROVIDER_SITE_OTHER): Payer: Self-pay | Admitting: Gastroenterology

## 2020-11-24 ENCOUNTER — Encounter: Payer: Self-pay | Admitting: Gastroenterology

## 2020-11-24 DIAGNOSIS — B182 Chronic viral hepatitis C: Secondary | ICD-10-CM

## 2020-11-24 DIAGNOSIS — B1920 Unspecified viral hepatitis C without hepatic coma: Secondary | ICD-10-CM | POA: Insufficient documentation

## 2020-11-24 NOTE — Progress Notes (Signed)
Primary Care Physician: Vivien Presto, MD  Primary Gastroenterologist:  Roetta Sessions, MD   Chief Complaint  Patient presents with  . Hepatitis C    HPI: John [name] is a 25 y.o. male here for follow-up of hepatitis C.  He was seen back in November for initial evaluation.  He has a history of polysubstance abuse, major depressive disorder, previous suicide attempt.  CT abdomen pelvis with contrast October 2021: Focal area of decreased attenuation in the right lobe of the liver near the dome measuring 2.4 x 2 cm.  Not classic for hemangioma.  MRI liver with and without contrast completed November 2021: Lesion within the lateral aspect of the right hepatic lobe present since March 2014, slowly increased in size in the interval.  Signal and enhancement characteristics are consistent with benign hemangioma.  Labs November 2021 with positive hepatitis C antibody.  AST 58, ALT 124 at that time.  HCV RNA of 3,990,000.  Genotype 1b.  HCV fibrosis: F0.  Negative for hepatitis B, HIV.    Patient feels well.  Denies any abdominal pain.  Bowel movements are regular.  No blood in the stool or melena.  No upper GI symptoms.  Has been watching his diet closely.  Trying to eat healthy.  Back in the gym working out.  New job at [employer] going well.  States he has had no alcohol since October 29th.  No illicit drug use since June 2021.    Current Outpatient Medications  Medication Sig Dispense Refill  . cholecalciferol (VITAMIN D3) 25 MCG (1000 UNIT) tablet Take 1,000 Units by mouth daily.    Chilton Si Tea, Camellia sinensis, (GREEN TEA PO) Take 1 tablet by mouth daily.    . Multiple Vitamin (MULTIVITAMIN WITH MINERALS) TABS tablet Take 1 tablet by mouth daily.    . Multiple Vitamins-Minerals (ZINC PO) Take 1 tablet by mouth daily.     No current facility-administered medications for this visit.    Allergies as of 11/24/2020 - Review Complete 11/24/2020  Allergen Reaction Noted   . Acetaminophen Rash 09/04/2015  . Benzonatate Other (See Comments) 07/27/2018    ROS:  General: Negative for anorexia, weight loss, fever, chills, fatigue, weakness. ENT: Negative for hoarseness, difficulty swallowing , nasal congestion. CV: Negative for chest pain, angina, palpitations, dyspnea on exertion, peripheral edema.  Respiratory: Negative for dyspnea at rest, dyspnea on exertion, cough, sputum, wheezing.  GI: See history of present illness. GU:  Negative for dysuria, hematuria, urinary incontinence, urinary frequency, nocturnal urination.  Endo: Negative for unusual weight change.    Physical Examination:   BP 121/71   Pulse (!) 51   Temp (!) 97.5 F (36.4 C) (Temporal)   Ht 6\' 3"  (1.905 m)   Wt 187 lb (84.8 kg)   BMI 23.37 kg/m   General: Well-nourished, well-developed in no acute distress.  Eyes: No icterus. Mouth: masked Abdomen: Bowel sounds are normal, nontender, nondistended, no hepatosplenomegaly or masses, no abdominal bruits or hernia , no rebound or guarding.   Extremities: No lower extremity edema. No clubbing or deformities. Neuro: Alert and oriented x 4   Skin: Warm and dry, no jaundice.   Psych: Alert and cooperative, normal mood and affect.  Labs:  See hpi  Imaging Studies: No results found.  Impression/plan:  26 year old male with history of chronic hepatitis C, genotype 1b, treatment nave.  Viral load of 3,990,000.  HCV FibroSure with no fibrosis.  MRI completed for liver mass, findings  consistent with benign hemangioma.  Plans to treat hepatitis C with 12 weeks of Epclusa.  Patient is in the process of completing patient assistance forms.  Once those are complete we will submit for approval.  Discussed with patient and his father at length, hepatitis C treatment can only be successful if he is compliant with medication.  Needs to take at the same time each day.  With any medication changes including over-the-counter medications, he must  run this by [place] to make sure they do not interfere with his hepatitis C medication.  He is aware that he can reinfect himself if he goes back to risky behaviors.  Continue to encourage him to avoid alcohol use.  Avoid any herbal medications.  Once we have received his medication, we will get him started and plan for repeat labs 4 weeks into therapy.

## 2020-11-24 NOTE — Patient Instructions (Signed)
1. Continue to avoid alcohol and illicit drugs.  2. Avoid all herbal medications.  3. We will submit your papers for Hep C medication approval.  4. When you start Epclusa, you will need to be consistent. Take medication at the same time each day. You will need to keep [place] posted on any medication changes as many can interfere with Epclusa. Do not take over the counter medications without approving with [place] beforehand.

## 2020-12-23 ENCOUNTER — Telehealth: Payer: Self-pay | Admitting: Internal Medicine

## 2020-12-23 NOTE — Telephone Encounter (Signed)
Patient called asking if we have heard anything about his hep-c medication.  Patient said that he got word that he was approved for the medication

## 2020-12-27 NOTE — Telephone Encounter (Signed)
[employer] letter was received and they were suppose to reach out to pt. When pt calls, I will inform him that he is suppose to contact them at [phone]. Once pt contacts them, [facility] will arrange delivery of medication with our office.

## 2020-12-27 NOTE — Telephone Encounter (Signed)
Lmom, waiting on a return call.  

## 2020-12-27 NOTE — Telephone Encounter (Signed)
Correction. [employer] will be contacted and not [employer].

## 2020-12-29 NOTE — Telephone Encounter (Signed)
See attached Epclusa instructions for when medication arrives.  He will need CMET, CBC, HCV RNA after he completes four weeks of Epclusa.  He will need follow up OV with me after four weeks of Epclusa.

## 2020-12-29 NOTE — Telephone Encounter (Signed)
Noted  

## 2020-12-29 NOTE — Telephone Encounter (Signed)
Spoke with Engineer, manufacturing systems. A verbal RX was given for Epclusa 400 mg/100 mg, one tab daily with 2 refills. They will reach out to [facility] team for the approval of RX and then will contact our office back to schedule delivery.   Lmom, to discuss with pt.

## 2020-12-29 NOTE — Telephone Encounter (Signed)
Tana Coast, PA, I received an approval from [employer] for pt assistance for Epclusa. A verbal RX was given this morning. They will contact me to arrange delivery. Approval dates for this program is 12/29/2020-03/23/2021.  Approval letter will be scanned in pts chart.

## 2020-12-29 NOTE — Telephone Encounter (Signed)
Epclusa for Hepatitis C Instructions:   Take Epclusa, one tablet daily with or without food. You should take it at approximately the same time every day.  Do not miss a dose. You will complete 12 weeks of treatment.    1. Do not run out of Epclusa! If you are down to one week of medication left and have not heard about your next shipment, please let us know as soon as possible.   2. If you need to start a new medication, prescription from your doctor or over the counter medication, you need to contact us to make sure it does not interfere with Epclusa. There are several medications that can interfere with Epclusa and can make you sick or make the medication not work.  3. If you need to take a medication for acid reflux, your choices are as follows: 1. TUMS or Rolaids separated at least four hours from [place].  2. Pepcid (famotidine) 40mg  up to twice per day administered with Epclusa or 12 hours apart from .    4. Tylenol (acetominophen) and Advil (ibuprofen) are safe to take with Epclusa if needed for headache, fever, pain.   5. You will have and appointment and blood work done after 4 weeks of treatment to make sure you are tolerating Epclusa and to see if the medication is getting rid of the Hepatitis C.   6. DO NOT stop Epclusa unless instructed to by your provider.  7. You will also have blood work done at the end of treatment and 12-24 weeks after end of treatment.        Patient signature: ____________________  Date: _______________

## 2021-01-04 NOTE — Telephone Encounter (Signed)
Received a call from [employer] Pt assistance program. Delivery was scheduled for 01/06/21 for Hep C Medication.

## 2021-01-06 NOTE — Telephone Encounter (Signed)
Hep C medication arrived. Lmom, for pt.

## 2021-01-24 NOTE — Telephone Encounter (Signed)
Received a call. pts Hep C medication for the month of March will be delivered on 02/03/21. Will notify pt when it medication arrives.

## 2021-02-03 NOTE — Telephone Encounter (Signed)
Medication arrived. Pt is aware and will pick medication up next week.

## 2021-02-17 NOTE — Telephone Encounter (Signed)
Spoke with pt. Pt's job sent him out of town. Pt's father is going to stop by and pick up his medication. Pt took his last pill and will miss two days of medication. Pt is aware that start medication when he gets back in town Saturday and don't double up on medicine.

## 2021-02-24 NOTE — Telephone Encounter (Signed)
Received a call from pt assistance where pt receives hep c medication from. Pt's last shipment will be delivered on Tuesday 03/01/2021. Pt will not pick medication up then since he is not due for medication yet. Pt's pt assistance expires on 03/24/2021, and we want to make sure pt is able to complete his medication as directed.    Left a detailed message for pt. If pt wants to pick medication up tomorrow, he can call to make sure it has arrived.

## 2021-02-28 ENCOUNTER — Ambulatory Visit: Payer: Self-pay | Admitting: Gastroenterology

## 2021-02-28 ENCOUNTER — Encounter: Payer: Self-pay | Admitting: Internal Medicine

## 2021-03-23 ENCOUNTER — Other Ambulatory Visit (HOSPITAL_COMMUNITY)
Admission: RE | Admit: 2021-03-23 | Discharge: 2021-03-23 | Disposition: A | Discharge status: Home / Self Care | Payer: Medicaid Other | Source: Ambulatory Visit | Attending: Gastroenterology | Admitting: Gastroenterology

## 2021-03-23 ENCOUNTER — Ambulatory Visit (INDEPENDENT_AMBULATORY_CARE_PROVIDER_SITE_OTHER): Payer: Self-pay | Admitting: Gastroenterology

## 2021-03-23 ENCOUNTER — Other Ambulatory Visit: Payer: Self-pay

## 2021-03-23 ENCOUNTER — Encounter: Payer: Self-pay | Admitting: Gastroenterology

## 2021-03-23 VITALS — BP 138/84 | HR 65 | Temp 97.3°F | Ht 75.0 in | Wt 187.4 lb

## 2021-03-23 DIAGNOSIS — B182 Chronic viral hepatitis C: Secondary | ICD-10-CM

## 2021-03-23 LAB — CBC
HCT: 46.5 % (ref 39.0–52.0)
Hemoglobin: 15.9 g/dL (ref 13.0–17.0)
MCH: 30.2 pg (ref 26.0–34.0)
MCHC: 34.2 g/dL (ref 30.0–36.0)
MCV: 88.2 fL (ref 80.0–100.0)
Platelets: 237 10*3/uL (ref 150–400)
RBC: 5.27 MIL/uL (ref 4.22–5.81)
RDW: 11.8 % (ref 11.5–15.5)
WBC: 8 10*3/uL (ref 4.0–10.5)
nRBC: 0 % (ref 0.0–0.2)

## 2021-03-23 LAB — COMPREHENSIVE METABOLIC PANEL
ALT: 17 U/L (ref 0–44)
AST: 19 U/L (ref 15–41)
Albumin: 4.2 g/dL (ref 3.5–5.0)
Alkaline Phosphatase: 67 U/L (ref 38–126)
Anion gap: 6 (ref 5–15)
BUN: 18 mg/dL (ref 6–20)
CO2: 26 mmol/L (ref 22–32)
Calcium: 9 mg/dL (ref 8.9–10.3)
Chloride: 104 mmol/L (ref 98–111)
Creatinine, Ser: 0.95 mg/dL (ref 0.61–1.24)
GFR, Estimated: >60 mL/min (ref >60)
Glucose, Bld: 108 mg/dL — ABNORMAL HIGH (ref 70–99)
Potassium: 4 mmol/L (ref 3.5–5.1)
Sodium: 136 mmol/L (ref 135–145)
Total Bilirubin: 0.4 mg/dL (ref 0.3–1.2)
Total Protein: 7.4 g/dL (ref 6.5–8.1)

## 2021-03-23 NOTE — Progress Notes (Signed)
Primary Care Physician: Curly Rim, MD  Primary Gastroenterologist:  Garfield Cornea, MD   Chief Complaint  Patient presents with  . Hepatitis C    F/u    HPI: John [name] is a 25 y.o. male here for follow-up of hepatitis C, genotype 1b.  Last seen in January 2022.  He no showed his April appointment.  He has a history of polysubstance abuse, major depressive disorder, previous suicide attempt. Prior work-up: HCVRNA 3,990,000, genotype 1b, HCV fibrosis: F0.  History of liver lesion, further evaluated by MRI felt to be hemangioma.  Patient just completed second bottle of Epclusa, picking up third bottle today.  States he ran out of medication when he was out of town for work, between bottles 1 and 2.  Probably missed 5 days worth.  Otherwise states he has been compliant with medication.  Denies any alcohol or drug use for 6 months.  Denies abdominal pain.  Bowel movements regular.  No blood in the stool or melena.  No upper GI symptoms.  Feels well.   Current Outpatient Medications  Medication Sig Dispense Refill  . Multiple Vitamin (MULTIVITAMIN WITH MINERALS) TABS tablet Take 1 tablet by mouth daily.    . Multiple Vitamins-Minerals (ZINC PO) Take 1 tablet by mouth. occasionally    . Sofosbuvir-Velpatasvir (EPCLUSA PO) Take by mouth. Once daily     No current facility-administered medications for this visit.    Allergies as of 03/23/2021 - Review Complete 03/23/2021  Allergen Reaction Noted  . Acetaminophen Rash 09/04/2015  . Benzonatate Other (See Comments) 07/27/2018    ROS:  General: Negative for anorexia, weight loss, fever, chills, fatigue, weakness. ENT: Negative for hoarseness, difficulty swallowing , nasal congestion. CV: Negative for chest pain, angina, palpitations, dyspnea on exertion, peripheral edema.  Respiratory: Negative for dyspnea at rest, dyspnea on exertion, cough, sputum, wheezing.  GI: See history of present illness. GU:  Negative for  dysuria, hematuria, urinary incontinence, urinary frequency, nocturnal urination.  Endo: Negative for unusual weight change.    Physical Examination:   BP 138/84   Pulse 65   Temp (!) 97.3 F (36.3 C)   Ht '6\' 3"'  (1.905 m)   Wt 187 lb 6.4 oz (85 kg)   BMI 23.42 kg/m   General: Well-nourished, well-developed in no acute distress.  Eyes: No icterus. Mouth: masked Lungs: Clear to auscultation bilaterally.  Heart: Regular rate and rhythm, no murmurs rubs or gallops.  Abdomen: Bowel sounds are normal, nontender, nondistended, no hepatosplenomegaly or masses, no abdominal bruits or hernia , no rebound or guarding.   Extremities: No lower extremity edema. No clubbing or deformities. Neuro: Alert and oriented x 4   Skin: Warm and dry, no jaundice.   Psych: Alert and cooperative, normal mood and affect.    Assessment: 25 year old male with history of chronic hepatitis C, genotype 1b, viral load of 3,990,000.  HCV FibroSure with no fibrosis.  MRI completed for liver mass, findings consistent with benign hemangioma.  He is completed 8 weeks of Epclusa.  Treatment was interrupted for about 5 days when he ran out of medication.  States he was out of town for a job and did not realize his bottle was nearly empty.  Unfortunately he missed his April appointment.  We have not obtained 4-week labs.  Plan for c-Met, CBC, HCVRNA today.  We will complete 12 weeks of Epclusa.  He is aware that he will need a 34-monthposttreatment HCVRNA level to determine whether  or not he is cured.  We also discussed the fact that he can become reinfected hepatitis C if he is exposed again.    Plan: 1. Update labs. 2. Complete Epclusa, 12 weeks. 3. 48-monthpost Epclusa HCVRNA level will be arranged. 4. Continue avoid alcohol and illicit drug use.

## 2021-03-23 NOTE — Progress Notes (Signed)
CC'ED TO PCP 

## 2021-03-23 NOTE — Patient Instructions (Signed)
1. Please go to [facility] for labs today. We will be in touch with results. 2. Complete Epclusa (full 12 weeks).  3. Three months after you complete Epclusa, we will recheck labs for Hepatitis C. If negative at that time, you will be considered cured. Please note, it is possible to get reinfected with Hepatitis C if you come in contact with the virus again.  4. Continue to avoid alcohol and illicit drugs.

## 2021-03-24 LAB — HCV RNA QUANT: HCV Quantitative: NOT DETECTED IU/mL (ref >50)

## 2021-03-29 ENCOUNTER — Telehealth: Payer: Self-pay

## 2021-03-29 ENCOUNTER — Other Ambulatory Visit: Payer: Self-pay

## 2021-03-29 DIAGNOSIS — B182 Chronic viral hepatitis C: Secondary | ICD-10-CM

## 2021-03-29 DIAGNOSIS — R768 Other specified abnormal immunological findings in serum: Secondary | ICD-10-CM

## 2021-03-29 NOTE — Progress Notes (Signed)
Dear Vicente Serene,   Good News!!!! [facility] was not detected. Your LFT's are normal. The Rx Dorita Fray is working. Please be sure to complete the full 12 weeks of this medication. (It is very important that you do so).  We will repeat HCV RNA in five months. We will send you the paperwork closer to that time frame for your repeat labs.    If you have any questions or concerns please call our office @ [phone].    Thank you, Heloise Beecham, CMA

## 2021-03-29 NOTE — Telephone Encounter (Signed)
FYI: Letter from [employer] regarding patient assistance program- withdrawal letter where the pt's eligibility period expired this was effective of 03/24/2021 due to the following reason: Eligibility period expired.    The pt is on his last bottle of Epclusa.

## 2021-04-04 ENCOUNTER — Encounter: Payer: Self-pay | Admitting: *Deleted

## 2021-04-04 NOTE — Telephone Encounter (Signed)
Noted  

## 2021-10-04 ENCOUNTER — Telehealth: Payer: Medicaid Other | Admitting: Physician Assistant

## 2021-10-04 DIAGNOSIS — L0100 Impetigo, unspecified: Secondary | ICD-10-CM

## 2021-10-04 MED ORDER — DOXYCYCLINE HYCLATE 100 MG PO TABS: 100.0000 mg | ORAL_TABLET | Freq: Two times a day (BID) | ORAL | 0 refills | Status: DC

## 2021-10-04 NOTE — Patient Instructions (Signed)
[name], thank you for joining Margaretann Loveless, PA-C for today's virtual visit.  While this provider is not your primary care provider (PCP), if your PCP is located in our provider database this encounter information will be shared with them immediately following your visit.  Consent: (Patient) John [name] provided verbal consent for this virtual visit at the beginning of the encounter.  Current Medications:  Current Outpatient Medications:    doxycycline (VIBRA-TABS) 100 MG tablet, Take 1 tablet (100 mg total) by mouth 2 (two) times daily., Disp: 20 tablet, Rfl: 0   Multiple Vitamin (MULTIVITAMIN WITH MINERALS) TABS tablet, Take 1 tablet by mouth daily., Disp: , Rfl:    Multiple Vitamins-Minerals (ZINC PO), Take 1 tablet by mouth. occasionally, Disp: , Rfl:    Sofosbuvir-Velpatasvir (EPCLUSA PO), Take by mouth. Once daily, Disp: , Rfl:    Medications ordered in this encounter:  Meds ordered this encounter  Medications   doxycycline (VIBRA-TABS) 100 MG tablet    Sig: Take 1 tablet (100 mg total) by mouth 2 (two) times daily.    Dispense:  20 tablet    Refill:  0    Order Specific Question:   Supervising Provider    Answer:   Hyacinth Meeker, BRIAN [[id number]]     *If you need refills on other medications prior to your next appointment, please contact your pharmacy*  Follow-Up: Call back or seek an in-person evaluation if the symptoms worsen or if the condition fails to improve as anticipated.  Other Instructions MRSA Infection, Diagnosis, Adult Methicillin-resistant Staphylococcus aureus (MRSA) infection is caused by bacteria called Staphylococcus aureus, or staph, that no longer respond to common antibiotic medicines (drug-resistant bacteria). MRSA infection can be hard to treat. Most of the time, MRSA can be on the skin or in the nose without causing problems (colonized). However, if MRSA enters the body through a cut, a sore, or an invasive medical device, it can cause a serious  infection. What are the causes? This condition is caused by staph bacteria. Illness may develop after exposure to the bacteria through: Skin-to-skin contact with someone who is infected with MRSA. Touching surfaces that have the bacteria on them. Having a procedure or using equipment that allows MRSA to enter the body. Having MRSA that lives on your skin and then enters your body through: A cut or scratch. A surgery or procedure. The use of a medical device. Contact with the bacteria may occur: During a stay in a hospital, rehabilitation facility, nursing home, or other health care facility (health care-associated MRSA). In daily activities where there is close contact with others, such as sports, child care centers, or at home (community-associated MRSA). What increases the risk? You are more likely to develop this condition if you: Have a surgery or procedure. Have an IV or a thin tube (catheter) placed in your body. Are elderly. Are on kidney dialysis. Have recently taken an antibiotic medicine. Live in a long-term care facility. Have a chronic wound or skin ulcer. Have a weak body defense system (immune system). Play sports that involve skin-to-skin contact. Live in a crowded place, like a dormitory or [employer]. Share towels, razors, or sports equipment with other people. Have a history of MRSA infection or colonization. What are the signs or symptoms? Symptoms of this condition depend on the area that is affected. Symptoms may include: A pus-filled pimple or boil. Pus that drains from your skin. A sore (abscess) under your skin or somewhere in your body. Fever with or  without chills. Difficulty breathing. Coughing up blood. Redness, warmth, swelling, or pain in the affected area. How is this diagnosed? This condition may be diagnosed based on: A physical exam. Your medical history. Taking a sample from the infected area and growing it in a lab (culture). You may  also have other tests, including: Imaging tests, such as X-rays, a CT scan, or an MRI. Lab tests, such as blood, urine, or phlegm (sputum) tests. You skin or nose may be swabbed when you are admitted to a health care facility for a procedure. This is to screen for MRSA. How is this treated? Treatment depends on the type of MRSA infection you have and how severe, deep, or extensive it is. Treatment may include: Antibiotic medicines. Surgery to drain pus from the infected area. Severe infections may require a hospital stay. Follow these instructions at home: Medicines Take over-the-counter and prescription medicines only as told by your health care provider. If you were prescribed an antibiotic medicine, use it as told by your health care provider. Do not stop using the antibiotic even if you start to feel better. Prevention Follow these instructions to avoid spreading the infection to others: Wash your hands frequently with soap and water. If soap and water are not available, use an alcohol-based hand sanitizer. Avoid close contact with those around you as much as possible. Do not use towels, razors, toothbrushes, bedding, or other items that will be used by others. Wash towels, bedding, and clothes in the washing machine with detergent and hot water. Dry them in a hot dryer. Clean surfaces regularly to remove germs (disinfection). Use products or solutions that contain bleach. Make sure you disinfect bathroom surfaces, food preparation areas, exercise equipment, and doorknobs.  General instructions If you have a wound, follow instructions from your health care provider about how to take care of your wound. Do not pick at scabs. Do not try to drain any infection sites or pimples. Tell all your health care providers that you have MRSA, or if you have ever had a MRSA infection. Keep all follow-up visits as told by your health care provider. This is important. Contact a health care provider if  you: Do not get better. Have symptoms that get worse. Have new symptoms. Get help right away if you have: Nausea or vomiting, or if you cannot take medicine without vomiting. Trouble breathing. Chest pain. These symptoms may represent a serious problem that is an emergency. Do not wait to see if the symptoms will go away. Get medical help right away. Call your local emergency services (911 in the [place] Do not drive yourself to the hospital. Summary MRSA infection is caused by bacteria called Staphylococcus aureus, or staph, that no longer respond to common antibiotic medicines. Treatment for this condition depends on the type of MRSA infection you have and how severe, deep, and extensive it is. If you were prescribed an antibiotic medicine, use it as told by your health care provider. Do not stop using the antibiotic even if you start to feel better. Follow instructions from your health care provider to avoid spreading the infection to others. This information is not intended to replace advice given to you by your health care provider. Make sure you discuss any questions you have with your health care provider. Document Revised: 01/16/2019 Document Reviewed: 01/17/2019 Elsevier Patient Education  2022 [employer].    If you have been instructed to have an in-person evaluation today at a local Urgent Care facility, please use  the link below. It will take you to a list of all of our available [facility], including address, phone number and hours of operation. Please do not delay care.  [facility]  If you or a family member do not have a primary care provider, use the link below to schedule a visit and establish care. When you choose a [facility] primary care physician or advanced practice provider, you gain a long-term partner in health. Find a Primary Care Provider  Learn more about [facility]'s in-office and virtual care options: [facility] - Get Care  Now

## 2021-10-04 NOTE — Progress Notes (Signed)
Virtual Visit Consent   Esley Brooking, you are scheduled for a virtual visit with a [facility] provider today.     Just as with appointments in the office, your consent must be obtained to participate.  Your consent will be active for this visit and any virtual visit you may have with one of our providers in the next 365 days.     If you have a MyChart account, a copy of this consent can be sent to you electronically.  All virtual visits are billed to your insurance company just like a traditional visit in the office.    As this is a virtual visit, video technology does not allow for your provider to perform a traditional examination.  This may limit your provider's ability to fully assess your condition.  If your provider identifies any concerns that need to be evaluated in person or the need to arrange testing (such as labs, EKG, etc.), we will make arrangements to do so.     Although advances in technology are sophisticated, we cannot ensure that it will always work on either your end or our end.  If the connection with a video visit is poor, the visit may have to be switched to a telephone visit.  With either a video or telephone visit, we are not always able to ensure that we have a secure connection.     I need to obtain your verbal consent now.   Are you willing to proceed with your visit today?    Godson Pollan has provided verbal consent on 10/04/2021 for a virtual visit (video or telephone).   Margaretann Loveless, PA-C   Date: 10/04/2021 11:59 AM   Virtual Visit via Video Note   I, Margaretann Loveless, connected with  Federico Maiorino  ([id number], 06-26-1996) on 10/04/21 at 11:45 AM EST by a video-enabled telemedicine application and verified that I am speaking with the correct person using two identifiers.  Location: Patient: Virtual Visit Location Patient: Home Provider: Virtual Visit Location Provider: Home Office   I discussed the limitations of evaluation and management  by telemedicine and the availability of in person appointments. The patient expressed understanding and agreed to proceed.    History of Present Illness: John [name] is a 25 y.o. who identifies as a male who was assigned male at birth, and is being seen today for skin lesion on face. Reports he had a small cut from work that progressed quickly into a larger, darker lesion. Denies drainage. Denies fever, chills, nausea, vomiting. Does have h/o recurrent impetigo. Since June of this year has been treated 3 times previously.   Problems:  Patient Active Problem List   Diagnosis Date Noted   Hepatitis C    Liver mass 09/28/2020   Abnormal LFTs 09/28/2020   Positive hepatitis C antibody test 09/28/2020   Chronic post-traumatic stress disorder (PTSD) 03/29/2019   Major depressive disorder, recurrent episode, moderate (HCC) 03/29/2019   Generalized anxiety disorder 11/23/2018   Unspecified episodic mood disorder 08/05/2014   Polysubstance abuse (HCC) 08/05/2014   Suicidal ideation 08/04/2014    Allergies:  Allergies  Allergen Reactions   Acetaminophen Rash   Benzonatate Other (See Comments)    Chest pain  Chest pain   Medications:  Current Outpatient Medications:    doxycycline (VIBRA-TABS) 100 MG tablet, Take 1 tablet (100 mg total) by mouth 2 (two) times daily., Disp: 20 tablet, Rfl: 0   Multiple Vitamin (MULTIVITAMIN WITH MINERALS) TABS tablet, Take 1 tablet by  mouth daily., Disp: , Rfl:    Multiple Vitamins-Minerals (ZINC PO), Take 1 tablet by mouth. occasionally, Disp: , Rfl:    Sofosbuvir-Velpatasvir (EPCLUSA PO), Take by mouth. Once daily, Disp: , Rfl:   Observations/Objective: Patient is well-developed, well-nourished in no acute distress.  Resting comfortably at home.  Head is normocephalic, atraumatic.  No labored breathing.  Speech is clear and coherent with logical content.  Patient is alert and oriented at baseline.  Video quality grainy, unable to appreciate wound  well  Assessment and Plan: 1. Impetigo - doxycycline (VIBRA-TABS) 100 MG tablet; Take 1 tablet (100 mg total) by mouth 2 (two) times daily.  Dispense: 20 tablet; Refill: 0  - Recurrent issue - Has been treated with Keflex and Mupirocin in the past - Has been applying Mupirocin currently - Will add doxycycline  - Seek in person evaluation if not improving or worsening  Follow Up Instructions: I discussed the assessment and treatment plan with the patient. The patient was provided an opportunity to ask questions and all were answered. The patient agreed with the plan and demonstrated an understanding of the instructions.  A copy of instructions were sent to the patient via MyChart unless otherwise noted below.   The patient was advised to call back or seek an in-person evaluation if the symptoms worsen or if the condition fails to improve as anticipated.  Time:  I spent 21 minutes with the patient via telehealth technology discussing the above problems/concerns.    Margaretann Loveless, PA-C

## 2021-12-03 ENCOUNTER — Emergency Department (HOSPITAL_COMMUNITY)
Admission: EM | Admit: 2021-12-03 | Discharge: 2021-12-03 | Discharge status: Against Medical Advice | Payer: Medicaid Other | Source: Home / Self Care

## 2022-01-22 IMAGING — CT CT ABD-PELV W/ CM
2 of 4 series · 15 of 46 positions shown, 17 images · IV contrast (OMNIPAQUE 300)
Comparison: January 15, 2013.

CLINICAL DATA: Abdominal pain and hematemesis. Elevated white blood
cell count. Recent binge of alcohol consumption

EXAM:
CT ABDOMEN AND PELVIS WITH CONTRAST
TECHNIQUE: Multidetector CT imaging of the abdomen and pelvis was performed
using the standard protocol following bolus administration of
intravenous contrast.
CONTRAST:  100mL OMNIPAQUE IOHEXOL 300 MG/ML  SOLN

[Series 2: axial st · axial · 0.68mm/px · z∈[+976,+1381]mm · 12 of 91 slices shown, 14 images]
[im 5/91  soft-tissue]
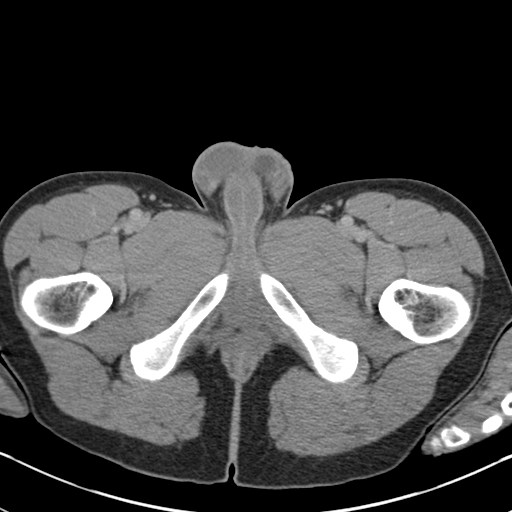
[im 5/91  bone]
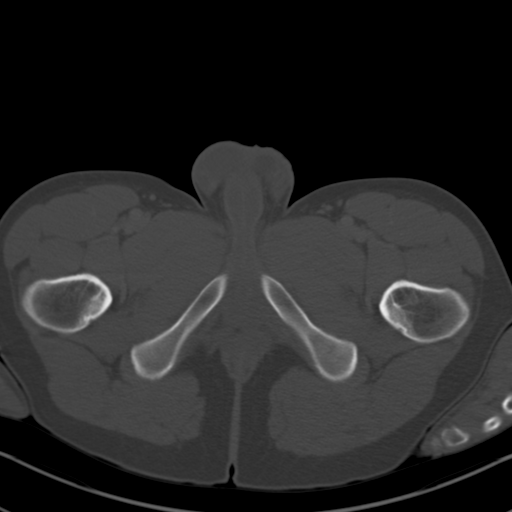
[im 14/91  soft-tissue]
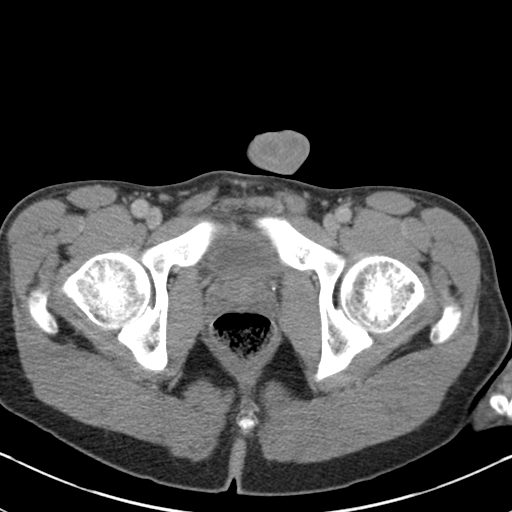
[im 19/91  soft-tissue]
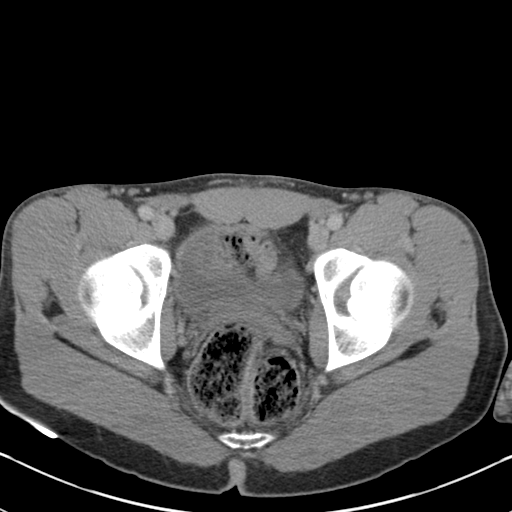
[im 28/91  soft-tissue]
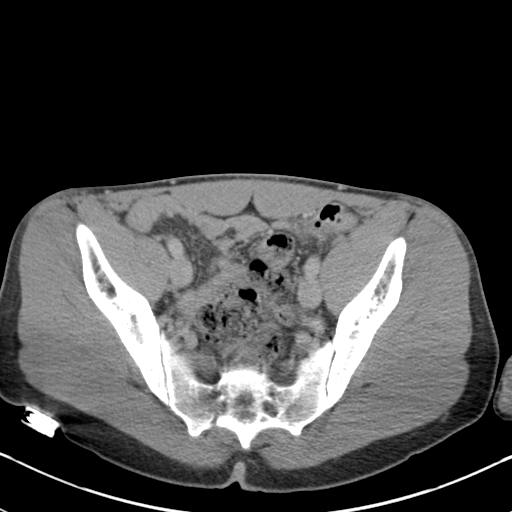
[im 37/91  soft-tissue]
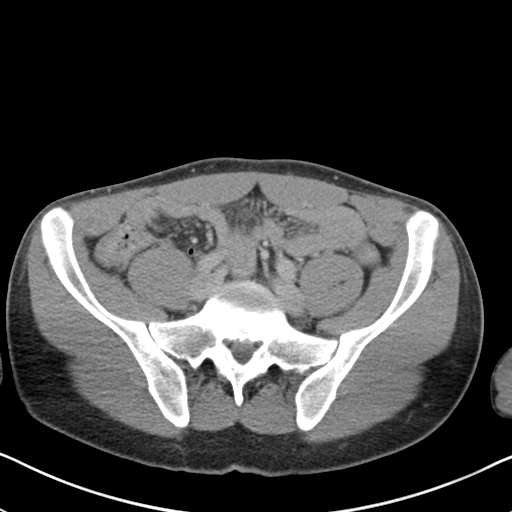
[im 41/91  soft-tissue]
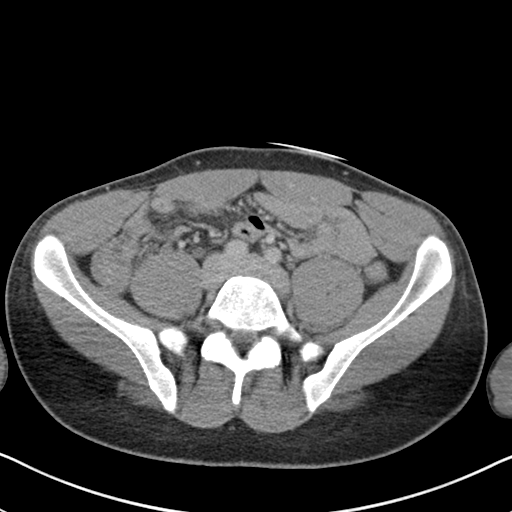
[im 50/91  soft-tissue]
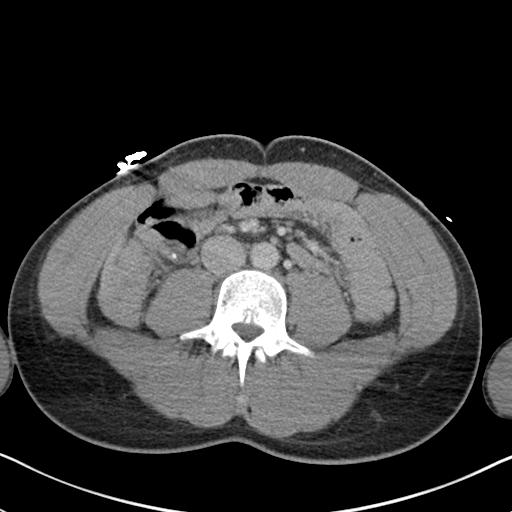
[im 55/91  soft-tissue]
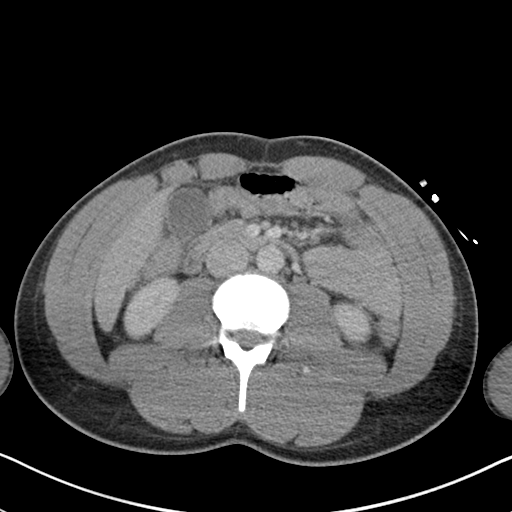
[im 64/91  soft-tissue]
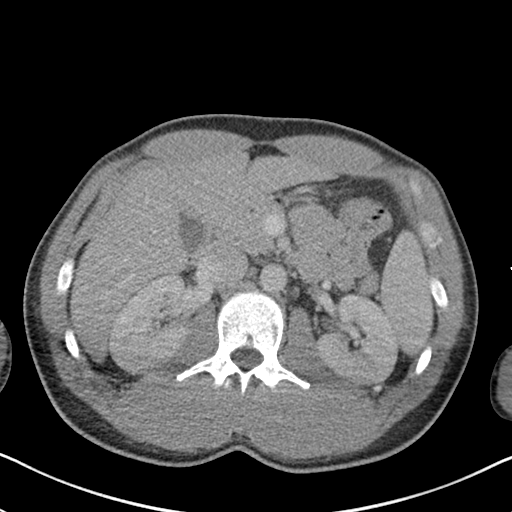
[im 64/91  bone]
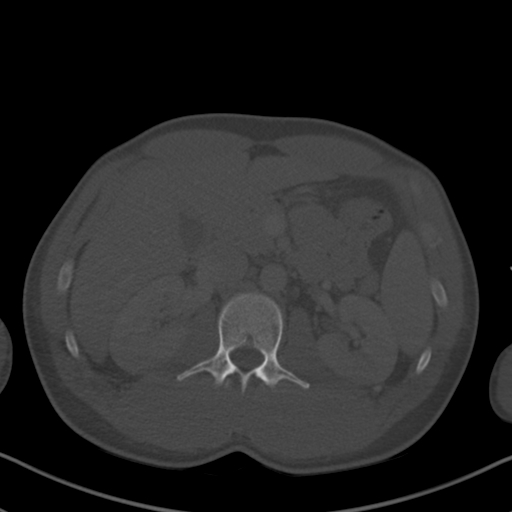
[im 73/91  soft-tissue]
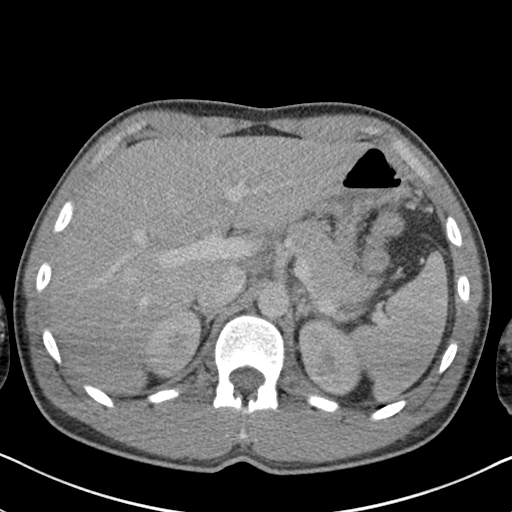
[im 77/91  soft-tissue]
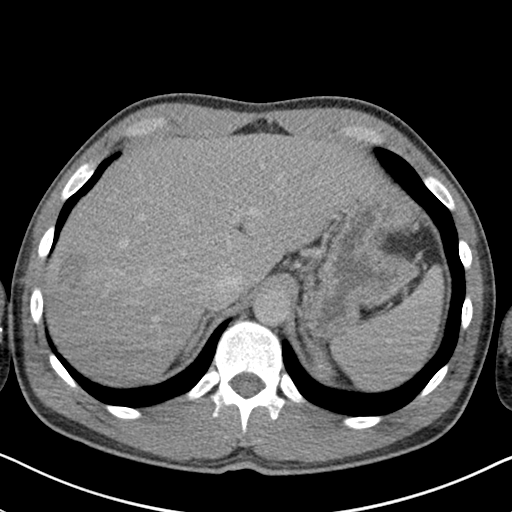
[im 86/91  soft-tissue]
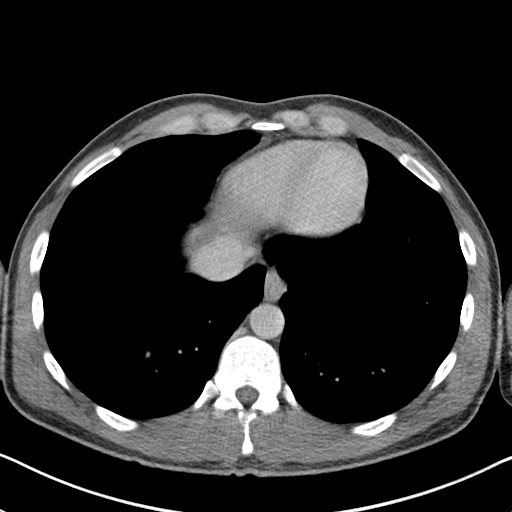

[Series 4: coronal st · coronal · 0.67mm/px · 3 of 134 slices shown]
[im 45/134  soft-tissue]
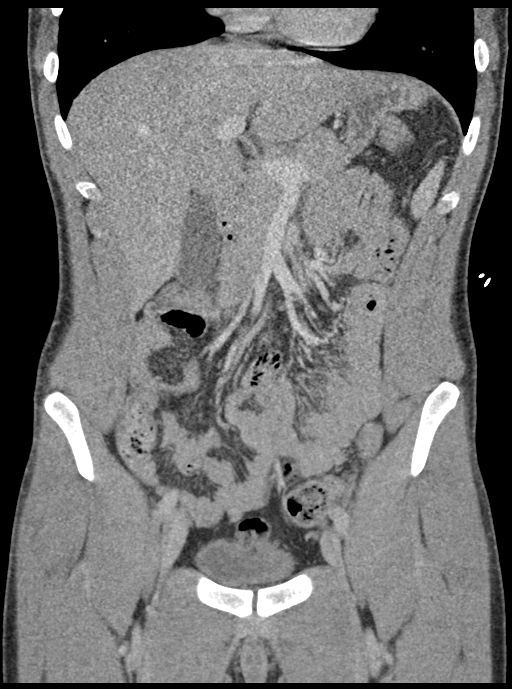
[im 60/134  soft-tissue]
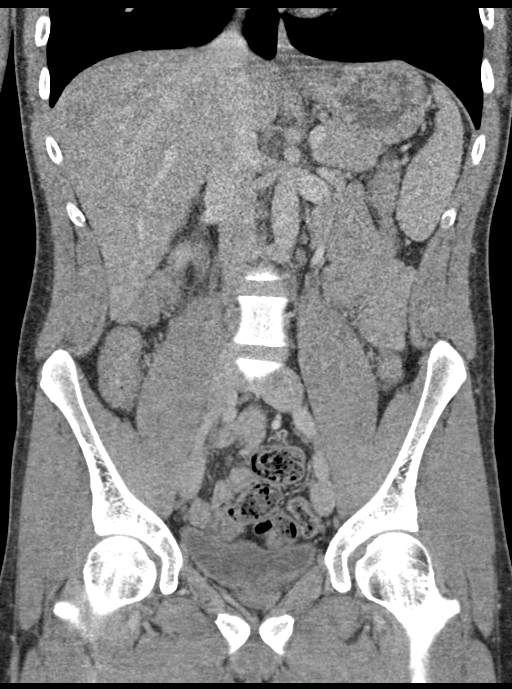
[im 74/134  soft-tissue]
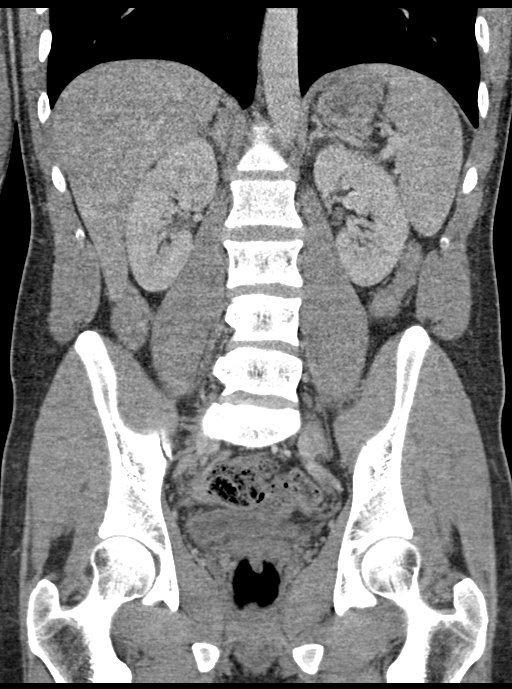

[15 of 46 positions shown; findings below may reference images not displayed]

FINDINGS: Lower chest: Lung bases are clear.

Hepatobiliary: There is a focal area of decreased attenuation in the
right lobe of the liver near the dome measuring 2.4 x 2.0 cm. No
other focal liver lesion is evident. The gallbladder wall is not
appreciably thickened. There is no biliary duct dilatation.

Pancreas: There is no pancreatic mass or inflammatory focus.

Spleen: No splenic lesions are evident.

Adrenals/Urinary Tract: Adrenals bilaterally appear normal. Kidneys
bilaterally show no evident mass or hydronephrosis on either side.
There is no evident renal or ureteral calculus on either side.
Urinary bladder is midline with wall thickness within normal limits.

Stomach/Bowel: There is moderate stool throughout the colon. Note
that the rectum is mildly distended with stool. There is no
perirectal wall thickening. Elsewhere, there is no appreciable bowel
wall thickening. No evident bowel obstruction. Terminal ileum
appears unremarkable. There is no demonstrable free air or portal
venous air.

Vascular/Lymphatic: There is no abdominal aortic aneurysm. No
arterial vascular lesions are evident. Major venous structures
appear patent. There is no evident adenopathy in the abdomen or
pelvis.

Reproductive: Prostate and seminal vesicles are normal in size and
contour.

Other: Appendix appears normal. No abscess or ascites evident in the
abdomen or pelvis.

Musculoskeletal: No blastic or lytic bone lesions. No intramuscular
or abdominal wall lesions are evident.
IMPRESSION: 1. No bowel wall thickening or bowel obstruction. No abscess in the
abdomen or pelvis. Appendix appears normal. Note that rectum is
mildly distended with stool.

2. Mass in the right lobe of the liver measuring 2.4 x 2.0 cm of
uncertain etiology. This lesion does not show classic hemangioma
changes on current examination. This finding warrants pre and serial
post-contrast MR or CT of the liver to further assess.

3. No evident renal or ureteral calculus. No hydronephrosis. Urinary
bladder wall thickness normal.

## 2022-01-23 ENCOUNTER — Emergency Department (HOSPITAL_COMMUNITY)
Admission: EM | Admit: 2022-01-23 | Discharge: 2022-01-23 | Disposition: A | Discharge status: Home / Self Care | Payer: Medicaid Other | Attending: Emergency Medicine | Admitting: Emergency Medicine

## 2022-01-23 ENCOUNTER — Emergency Department (HOSPITAL_BASED_OUTPATIENT_CLINIC_OR_DEPARTMENT_OTHER)
Admission: EM | Admit: 2022-01-23 | Discharge: 2022-01-23 | Discharge status: Against Medical Advice | Payer: Medicaid Other | Attending: Emergency Medicine | Admitting: Emergency Medicine

## 2022-01-23 ENCOUNTER — Emergency Department (HOSPITAL_BASED_OUTPATIENT_CLINIC_OR_DEPARTMENT_OTHER): Payer: Medicaid Other | Admitting: Radiology

## 2022-01-23 ENCOUNTER — Other Ambulatory Visit: Payer: Self-pay

## 2022-01-23 ENCOUNTER — Encounter (HOSPITAL_COMMUNITY): Payer: Self-pay | Admitting: Emergency Medicine

## 2022-01-23 ENCOUNTER — Emergency Department (HOSPITAL_COMMUNITY): Payer: Medicaid Other

## 2022-01-23 DIAGNOSIS — F99 Mental disorder, not otherwise specified: Secondary | ICD-10-CM

## 2022-01-23 DIAGNOSIS — S5001XA Contusion of right elbow, initial encounter: Secondary | ICD-10-CM | POA: Insufficient documentation

## 2022-01-23 DIAGNOSIS — S8011XA Contusion of right lower leg, initial encounter: Secondary | ICD-10-CM | POA: Insufficient documentation

## 2022-01-23 DIAGNOSIS — S0081XA Abrasion of other part of head, initial encounter: Secondary | ICD-10-CM | POA: Insufficient documentation

## 2022-01-23 DIAGNOSIS — S90812A Abrasion, left foot, initial encounter: Secondary | ICD-10-CM | POA: Insufficient documentation

## 2022-01-23 DIAGNOSIS — S50812A Abrasion of left forearm, initial encounter: Secondary | ICD-10-CM | POA: Insufficient documentation

## 2022-01-23 DIAGNOSIS — S52124A Nondisplaced fracture of head of right radius, initial encounter for closed fracture: Secondary | ICD-10-CM | POA: Insufficient documentation

## 2022-01-23 LAB — COMPREHENSIVE METABOLIC PANEL
ALT: 23 U/L (ref 0–44)
AST: 24 U/L (ref 15–41)
Albumin: 3.9 g/dL (ref 3.5–5.0)
Alkaline Phosphatase: 56 U/L (ref 38–126)
Anion gap: 12 (ref 5–15)
BUN: 13 mg/dL (ref 6–20)
CO2: 20 mmol/L — ABNORMAL LOW (ref 22–32)
Calcium: 9 mg/dL (ref 8.9–10.3)
Chloride: 105 mmol/L (ref 98–111)
Creatinine, Ser: 1.03 mg/dL (ref 0.61–1.24)
GFR, Estimated: >60 mL/min (ref >60)
Glucose, Bld: 87 mg/dL (ref 70–99)
Potassium: 4 mmol/L (ref 3.5–5.1)
Sodium: 137 mmol/L (ref 135–145)
Total Bilirubin: 1 mg/dL (ref 0.3–1.2)
Total Protein: 6.6 g/dL (ref 6.5–8.1)

## 2022-01-23 LAB — CBC WITH DIFFERENTIAL/PLATELET
Abs Immature Granulocytes: 0.03 10*3/uL (ref 0.00–0.07)
Basophils Absolute: 0 10*3/uL (ref 0.0–0.1)
Basophils Relative: 0 %
Eosinophils Absolute: 0.1 10*3/uL (ref 0.0–0.5)
Eosinophils Relative: 1 %
HCT: 40.8 % (ref 39.0–52.0)
Hemoglobin: 14.2 g/dL (ref 13.0–17.0)
Immature Granulocytes: 0 %
Lymphocytes Relative: 17 %
Lymphs Abs: 1.8 10*3/uL (ref 0.7–4.0)
MCH: 30 pg (ref 26.0–34.0)
MCHC: 34.8 g/dL (ref 30.0–36.0)
MCV: 86.1 fL (ref 80.0–100.0)
Monocytes Absolute: 1 10*3/uL (ref 0.1–1.0)
Monocytes Relative: 9 %
Neutro Abs: 8 10*3/uL — ABNORMAL HIGH (ref 1.7–7.7)
Neutrophils Relative %: 73 %
Platelets: 229 10*3/uL (ref 150–400)
RBC: 4.74 MIL/uL (ref 4.22–5.81)
RDW: 11.9 % (ref 11.5–15.5)
WBC: 10.8 10*3/uL — ABNORMAL HIGH (ref 4.0–10.5)
nRBC: 0 % (ref 0.0–0.2)

## 2022-01-23 LAB — ETHANOL: Alcohol, Ethyl (B): <10 mg/dL (ref <10)

## 2022-01-23 MED ORDER — IBUPROFEN 400 MG PO TABS: 600.0000 mg | ORAL_TABLET | Freq: Once | ORAL | Status: DC

## 2022-01-23 MED ORDER — LORAZEPAM 1 MG PO TABS
1.0000 mg | ORAL_TABLET | Freq: Once | ORAL | Status: AC
Start: 2022-01-23 — End: 2022-01-23
  Administered 2022-01-23: 1 mg via ORAL
  Filled 2022-01-23: qty 1
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
## 2022-01-23 NOTE — ED Notes (Signed)
Pt calls charge RN to room to say that he is leaving and is calling [facility] to make an appointment. He reports that he does not want to wait at [facility] ED for an xray. He reports using methamphetamine, heroin, and fentanyl last night and feels like he just wants to see [facility]. Pt gathered his belongings and has left room. Dr. Audley Hose notified that patient has left.  ?
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
## 2022-01-23 NOTE — ED Notes (Signed)
Pt noted to be visibly anxious and states that he feels like he might be withdrawing a little.  He acknowledges using and not being current on meds he states he typically receives from [facility] as I understood it. Explained plan to start IV, get labs and give PO ativan to help with anxiety.  PT is calm and cooperative at this time.  ?

## 2022-01-23 NOTE — ED Notes (Addendum)
Pt needed to leave urgently for personal reasons.  We were able to get him splinted and D/C paperwork in hand.  Pt verbalized understanding and is gone. Pt was in a hurry  to leave, unable to obtain DC VS. ?
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
## 2022-01-23 NOTE — ED Notes (Signed)
Pt delined ECG at this time ?
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
## 2022-01-24 ENCOUNTER — Other Ambulatory Visit: Payer: Self-pay

## 2022-01-24 ENCOUNTER — Encounter (HOSPITAL_COMMUNITY): Payer: Self-pay | Admitting: Emergency Medicine

## 2022-01-24 ENCOUNTER — Emergency Department (HOSPITAL_COMMUNITY)
Admission: EM | Admit: 2022-01-24 | Discharge: 2022-01-28 | Disposition: A | Discharge status: Other Acute Inpatient Hospital | Payer: Medicaid Other | Attending: Emergency Medicine | Admitting: Emergency Medicine

## 2022-01-24 DIAGNOSIS — Z20822 Contact with and (suspected) exposure to covid-19: Secondary | ICD-10-CM | POA: Insufficient documentation

## 2022-01-24 DIAGNOSIS — F17210 Nicotine dependence, cigarettes, uncomplicated: Secondary | ICD-10-CM | POA: Insufficient documentation

## 2022-01-24 DIAGNOSIS — F4312 Post-traumatic stress disorder, chronic: Secondary | ICD-10-CM | POA: Diagnosis present

## 2022-01-24 DIAGNOSIS — R45851 Suicidal ideations: Secondary | ICD-10-CM

## 2022-01-24 DIAGNOSIS — T401X2A Poisoning by heroin, intentional self-harm, initial encounter: Secondary | ICD-10-CM | POA: Insufficient documentation

## 2022-01-24 DIAGNOSIS — F1910 Other psychoactive substance abuse, uncomplicated: Secondary | ICD-10-CM | POA: Diagnosis present

## 2022-01-24 DIAGNOSIS — F3110 Bipolar disorder, current episode manic without psychotic features, unspecified: Secondary | ICD-10-CM | POA: Insufficient documentation

## 2022-01-24 DIAGNOSIS — E872 Acidosis, unspecified: Secondary | ICD-10-CM | POA: Insufficient documentation

## 2022-01-24 DIAGNOSIS — F332 Major depressive disorder, recurrent severe without psychotic features: Secondary | ICD-10-CM | POA: Diagnosis present

## 2022-01-24 DIAGNOSIS — R825 Elevated urine levels of drugs, medicaments and biological substances: Secondary | ICD-10-CM | POA: Insufficient documentation

## 2022-01-24 DIAGNOSIS — D72829 Elevated white blood cell count, unspecified: Secondary | ICD-10-CM | POA: Insufficient documentation

## 2022-01-24 DIAGNOSIS — F331 Major depressive disorder, recurrent, moderate: Secondary | ICD-10-CM | POA: Diagnosis present

## 2022-01-24 LAB — COMPREHENSIVE METABOLIC PANEL
ALT: 24 U/L (ref 0–44)
AST: 25 U/L (ref 15–41)
Albumin: 4.1 g/dL (ref 3.5–5.0)
Alkaline Phosphatase: 60 U/L (ref 38–126)
Anion gap: 12 (ref 5–15)
BUN: 17 mg/dL (ref 6–20)
CO2: 21 mmol/L — ABNORMAL LOW (ref 22–32)
Calcium: 9.1 mg/dL (ref 8.9–10.3)
Chloride: 103 mmol/L (ref 98–111)
Creatinine, Ser: 1.21 mg/dL (ref 0.61–1.24)
GFR, Estimated: >60 mL/min (ref >60)
Glucose, Bld: 92 mg/dL (ref 70–99)
Potassium: 4.4 mmol/L (ref 3.5–5.1)
Sodium: 136 mmol/L (ref 135–145)
Total Bilirubin: 0.7 mg/dL (ref 0.3–1.2)
Total Protein: 7 g/dL (ref 6.5–8.1)

## 2022-01-24 LAB — CBC WITH DIFFERENTIAL/PLATELET
Abs Immature Granulocytes: 0.06 10*3/uL (ref 0.00–0.07)
Basophils Absolute: 0 10*3/uL (ref 0.0–0.1)
Basophils Relative: 0 %
Eosinophils Absolute: 0.1 10*3/uL (ref 0.0–0.5)
Eosinophils Relative: 0 %
HCT: 41.3 % (ref 39.0–52.0)
Hemoglobin: 14.7 g/dL (ref 13.0–17.0)
Immature Granulocytes: 0 %
Lymphocytes Relative: 12 %
Lymphs Abs: 1.9 10*3/uL (ref 0.7–4.0)
MCH: 30.6 pg (ref 26.0–34.0)
MCHC: 35.6 g/dL (ref 30.0–36.0)
MCV: 85.9 fL (ref 80.0–100.0)
Monocytes Absolute: 1.1 10*3/uL — ABNORMAL HIGH (ref 0.1–1.0)
Monocytes Relative: 7 %
Neutro Abs: 12.9 10*3/uL — ABNORMAL HIGH (ref 1.7–7.7)
Neutrophils Relative %: 81 %
Platelets: 263 10*3/uL (ref 150–400)
RBC: 4.81 MIL/uL (ref 4.22–5.81)
RDW: 12 % (ref 11.5–15.5)
WBC: 16 10*3/uL — ABNORMAL HIGH (ref 4.0–10.5)
nRBC: 0 % (ref 0.0–0.2)

## 2022-01-24 LAB — ETHANOL: Alcohol, Ethyl (B): <10 mg/dL (ref <10)

## 2022-01-24 LAB — RAPID URINE DRUG SCREEN, HOSP PERFORMED
Amphetamines: POSITIVE — AB
Barbiturates: NOT DETECTED
Benzodiazepines: POSITIVE — AB
Cocaine: POSITIVE — AB
Opiates: POSITIVE — AB
Tetrahydrocannabinol: POSITIVE — AB

## 2022-01-24 LAB — SALICYLATE LEVEL: Salicylate Lvl: <7 mg/dL — ABNORMAL LOW (ref 7.0–30.0)

## 2022-01-24 LAB — ACETAMINOPHEN LEVEL: Acetaminophen (Tylenol), Serum: <10 ug/mL — ABNORMAL LOW (ref 10–30)

## 2022-01-24 LAB — RESP PANEL BY RT-PCR (FLU A&B, COVID) ARPGX2
Influenza A by PCR: NEGATIVE
Influenza B by PCR: NEGATIVE
SARS Coronavirus 2 by RT PCR: NEGATIVE

## 2022-01-24 MED ORDER — ADULT MULTIVITAMIN W/MINERALS CH
1.0000 | ORAL_TABLET | Freq: Every day | ORAL | Status: DC
  Administered 2022-01-24 – 2022-01-28 (×5): 1 via ORAL
  Filled 2022-01-24 (×5): qty 1

## 2022-01-24 MED ORDER — LITHIUM CARBONATE ER 300 MG PO TBCR
600.0000 mg | EXTENDED_RELEASE_TABLET | Freq: Two times a day (BID) | ORAL | Status: DC
Start: 2022-01-24 — End: 2022-01-28
  Administered 2022-01-24 – 2022-01-28 (×8): 600 mg via ORAL
  Filled 2022-01-24 (×8): qty 2

## 2022-01-24 MED ORDER — TRAZODONE HCL 50 MG PO TABS
50.0000 mg | ORAL_TABLET | Freq: Every day | ORAL | Status: DC
Start: 2022-01-24 — End: 2022-01-28
  Administered 2022-01-24 – 2022-01-27 (×4): 50 mg via ORAL
  Filled 2022-01-24 (×4): qty 1

## 2022-01-24 MED ORDER — GABAPENTIN 300 MG PO CAPS
300.0000 mg | ORAL_CAPSULE | Freq: Three times a day (TID) | ORAL | Status: DC | PRN
Start: 2022-01-24 — End: 2022-01-28
  Administered 2022-01-24 – 2022-01-27 (×5): 300 mg via ORAL
  Filled 2022-01-24 (×5): qty 1

## 2022-01-24 MED ORDER — BUPRENORPHINE HCL-NALOXONE HCL 8-2 MG SL SUBL
2.0000 | SUBLINGUAL_TABLET | Freq: Every day | SUBLINGUAL | Status: DC
Start: 2022-01-24 — End: 2022-01-28
  Administered 2022-01-24 – 2022-01-28 (×5): 2 via SUBLINGUAL
  Filled 2022-01-24 (×5): qty 2

## 2022-01-24 MED ORDER — PRAZOSIN HCL 2 MG PO CAPS
2.0000 mg | ORAL_CAPSULE | Freq: Every evening | ORAL | Status: DC | PRN
  Administered 2022-01-24 – 2022-01-27 (×2): 2 mg via ORAL
  Filled 2022-01-24 (×2): qty 1

## 2022-01-24 MED ORDER — LORAZEPAM 1 MG PO TABS
1.0000 mg | ORAL_TABLET | Freq: Once | ORAL | Status: AC
Start: 2022-01-24 — End: 2022-01-24
  Administered 2022-01-24: 1 mg via ORAL
  Filled 2022-01-24: qty 1

## 2022-01-24 MED ORDER — NICOTINE 21 MG/24HR TD PT24
21.0000 mg | MEDICATED_PATCH | Freq: Every day | TRANSDERMAL | Status: DC
  Administered 2022-01-25 – 2022-01-28 (×4): 21 mg via TRANSDERMAL
  Filled 2022-01-24 (×4): qty 1

## 2022-01-24 NOTE — ED Notes (Signed)
TTS in process 

## 2022-01-24 NOTE — BH Assessment (Addendum)
Error in charting.

## 2022-01-24 NOTE — ED Notes (Signed)
Pt belonging inventoried and placed in locker #7 ?

## 2022-01-24 NOTE — BH Assessment (Signed)
@  1930, requested patient's nurse Phineas Douglas, RN) to place the TTS machine inpatient's room for a TTS assessment.  ?
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
## 2022-01-24 NOTE — ED Notes (Signed)
Dinner has arrived °
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
## 2022-01-24 NOTE — ED Notes (Signed)
Patient ambulatory to bed 48 with sitter. Patient voluntary at this time.  ?
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
## 2022-01-25 DIAGNOSIS — R45851 Suicidal ideations: Secondary | ICD-10-CM

## 2022-01-25 MED ORDER — BACITRACIN ZINC 500 UNIT/GM EX OINT
TOPICAL_OINTMENT | Freq: Two times a day (BID) | CUTANEOUS | Status: DC
  Administered 2022-01-25: 1 via TOPICAL
  Filled 2022-01-25: qty 28.4

## 2022-01-25 NOTE — ED Notes (Signed)
Breakfast order placed ?

## 2022-01-25 NOTE — ED Notes (Signed)
Up at nurses station to use the phone ?

## 2022-01-25 NOTE — ED Notes (Signed)
Pt care taken, no complaints at this time. Helped him to adjust his sling. ?

## 2022-01-25 NOTE — ED Notes (Signed)
Received verbal report from Lou M RN at this time ?

## 2022-01-25 NOTE — ED Provider Notes (Signed)
Emergency Medicine Observation Re-evaluation Note ? ?Keygan Dumond is a 26 y.o. male, seen on rounds today.  Pt initially presented to the ED for complaints of Suicidal  and Psychiatric Evaluation ?Currently, the patient is sleeping. ? ?Physical Exam  ?BP 98/61 (BP Location: Left Arm)   Pulse 69   Temp 97.9 ?F (36.6 ?C) (Oral)   Resp 18   SpO2 97%  ?Physical Exam ?General: Sleeping, nondistressed ?Cardiac: Extremities well perfused ?Lungs: Breathing is unlabored ?Psych: Deferred ? ?ED Course / MDM  ?EKG:  ? ?I have reviewed the labs performed to date as well as medications administered while in observation.  Recent changes in the last 24 hours include patient presented to the ED yesterday morning for SI.  TTS has evaluated and inpatient treatment is recommended.  Psych medications have been ordered.  Patient is currently awaiting placement. ? ?Plan  ?Current plan is for inpatient psychiatric admission. ? Mitcheal Sweetin is not under involuntary commitment. ? ? ?  ?Gloris Manchester, MD ?01/25/22 [phone] ? ?

## 2022-01-25 NOTE — ED Notes (Signed)
TTS 

## 2022-01-25 NOTE — ED Notes (Signed)
Dsy applied to Lt heel as directed ?
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
## 2022-01-25 NOTE — ED Notes (Signed)
Pt taking a shower 

## 2022-01-25 NOTE — ED Notes (Signed)
Pt made 2 TC at desk .  ?

## 2022-01-26 MED ORDER — IBUPROFEN 400 MG PO TABS
600.0000 mg | ORAL_TABLET | Freq: Four times a day (QID) | ORAL | Status: DC | PRN
  Administered 2022-01-26: 600 mg via ORAL
  Filled 2022-01-26: qty 1
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
## 2022-01-26 NOTE — ED Notes (Signed)
Patient requesting gabapentin for his pain. Will administer ordered PRN gabapentin for patient at this time. Patient remains calm and cooperative.  ?

## 2022-01-26 NOTE — ED Provider Notes (Signed)
Emergency Medicine Observation Re-evaluation Note ? ?John [name] is a 26 y.o. male, seen on rounds today.  Pt initially presented to the ED for complaints of Suicidal  and Psychiatric Evaluation ?Currently, the patient is awake and alert.  He has eaten his breakfast. ? ?Physical Exam  ?BP (!) 102/58 (BP Location: Left Arm)   Pulse (!) 46   Temp 97.8 ?F (36.6 ?C) (Oral)   Resp 16   SpO2 98%  ?Physical Exam ?General: awake and alert ?Cardiac: rr ?Lungs: breathing comfortably ?Psych: calm ? ?ED Course / MDM  ?EKG:  ? ?I have reviewed the labs performed to date as well as medications administered while in observation.  Recent changes in the last 24 hours include TTS eval last night.   ? ?Plan  ?Current plan is for Re-assess today. ? John [name] is not under involuntary commitment. ? ? ?  ?Jacalyn Lefevre, MD ?01/26/22 [phone] ? ?

## 2022-01-26 NOTE — Progress Notes (Signed)
Inpatient Behavioral Health Placement ? ?Pt meets inpatient criteria per Ophelia Shoulder, NP. Per Ophelia Shoulder, NP referral was sent to out of network providers.  Referral was sent to the following facilities;  ? ?Destination ?Service Provider Address Phone Fax  ?[facility]  [address] [place]., [place] [address] [phone]  ?[facility]  [address] [place] [address] [phone]  ?[facility] [address] [place] [address] [phone]  ?[facility]  [address] [place], [place] [address] [phone]  ?[facility]  [address], [place] [address] [phone]  ?[facility]  [address], [place] [address] [phone]  ?[facility]  [address] [place] [place] [address] [phone]  ?[facility]  [address], [place] [address] [phone]  ?[facility]  [address] [place], [place] [address] [phone]  ?[facility]  [address] [place] [address] [phone]  ?[facility]  [address] [place] [address] [phone]  ?[facility]  [address] [place] [address] [phone]  ?[facility]  [address] [place], [place] [address] [phone]  ?[facility]  [address] [place] [address] [phone]  ?[facility]      ? ? ?Situation ongoing,  CSW will follow up. ? ? ?Maryjean Ka, MSW, LCSWA ?01/26/2022  @ 7:45 PM ? ?

## 2022-01-26 NOTE — ED Notes (Signed)
Report handed off to Clara RN.  ?

## 2022-01-27 ENCOUNTER — Encounter (HOSPITAL_COMMUNITY): Payer: Self-pay

## 2022-01-27 NOTE — ED Notes (Addendum)
This RN called Pamala Hurry at [facility] [phone]) in regards to possible patient placement there. Requested paperwork to be faxed.  ?
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
## 2022-01-27 NOTE — ED Provider Notes (Signed)
Emergency Medicine Observation Re-evaluation Note ? ?John [name] is a 26 y.o. male, seen on rounds today.  Pt initially presented to the ED for complaints of Suicidal  and Psychiatric Evaluation ?Currently, the patient is resting comfortably awaiting psychiatric placement per previous documentation. ? ?Physical Exam  ?BP 106/61 (BP Location: Left Arm)   Pulse (!) 53   Temp 98.1 ?F (36.7 ?C) (Oral)   Resp 16   SpO2 100%  ?Physical Exam ?General: Resting ?Cardiac: No murmur ?Lungs: Clear ?Psych: No complaints ? ?ED Course / MDM  ?EKG:  ? ?I have reviewed the labs performed to date as well as medications administered while in observation.  Recent changes in the last 24 hours include none. ? ?Plan  ?Current plan is for awaiting placement. ? Deontay Deignan is not under involuntary commitment. ? ? ?  ?Morris Markham, Gwenyth Allegra, MD ?01/27/22 [phone] ? ?

## 2022-01-27 NOTE — Progress Notes (Signed)
CSW communicated with pt's nurse Olivia Mackie, RN requesting RN to follow up with [facility] at [phone]. Intake with [facility], Britta Mccreedy requested that nursing call in a few hours. ? ? ? ?Maryjean Ka, MSW, LCSWA ?01/27/2022 12:06 AM ? ? ?

## 2022-01-27 NOTE — Progress Notes (Signed)
Patient has been faxed out per the request of Dr. Serafina Mitchell. Patient meets [facility] inpatient criteria per Merlyn Lot, NP. Patient has been faxed out to the following facilities:  ? ?[facility]  [address] [place] [address] [phone]  ?[place], [place] [phone]  ?[facility] [address] [place] [address] [phone]  ?[facility]  [place], [place] [address] [phone]  ?[facility]  [address] [place] [address] [phone]  ?[facility]  [address] [place] [address] [phone]  ?[place]  [place] [place] [address] [phone]  ?[facility]  [address] [place] [address], [place] [address] [phone]  ?[facility]  [place], [place] [address] [phone]  ?[facility]  [address] [place] [address] [phone]  ?[place] [place] [address] [phone]  ?[facility]  [address] [place] [address] [phone]  ?[place]  [place], [place] [address] [phone]  ?[place] [place] [address] [phone]  ?[place]  [address] [place]  [address] [phone]  ? ?Mariea Clonts, MSW, LCSW-A  ?9:45 AM 01/27/2022   ?

## 2022-01-28 ENCOUNTER — Encounter (HOSPITAL_COMMUNITY): Payer: Self-pay | Admitting: Registered Nurse

## 2022-01-28 ENCOUNTER — Other Ambulatory Visit (HOSPITAL_COMMUNITY)
Admission: EM | Admit: 2022-01-28 | Discharge: 2022-02-01 | Disposition: A | Discharge status: Home / Self Care | Payer: No Payment, Other | Attending: Registered Nurse | Admitting: Registered Nurse

## 2022-01-28 DIAGNOSIS — F17210 Nicotine dependence, cigarettes, uncomplicated: Secondary | ICD-10-CM | POA: Insufficient documentation

## 2022-01-28 DIAGNOSIS — R45851 Suicidal ideations: Secondary | ICD-10-CM | POA: Insufficient documentation

## 2022-01-28 DIAGNOSIS — Z79899 Other long term (current) drug therapy: Secondary | ICD-10-CM | POA: Insufficient documentation

## 2022-01-28 DIAGNOSIS — F1910 Other psychoactive substance abuse, uncomplicated: Secondary | ICD-10-CM | POA: Insufficient documentation

## 2022-01-28 LAB — RESP PANEL BY RT-PCR (FLU A&B, COVID) ARPGX2
Influenza A by PCR: NEGATIVE
Influenza B by PCR: NEGATIVE
SARS Coronavirus 2 by RT PCR: NEGATIVE

## 2022-01-28 MED ORDER — MAGNESIUM HYDROXIDE 400 MG/5ML PO SUSP
30.0000 mL | Freq: Every day | ORAL | Status: DC | PRN
Start: 2022-01-28 — End: 2022-02-01

## 2022-01-28 MED ORDER — TRAZODONE HCL 50 MG PO TABS
50.0000 mg | ORAL_TABLET | Freq: Every evening | ORAL | Status: DC | PRN
Start: 2022-01-28 — End: 2022-02-01
  Administered 2022-01-28 – 2022-01-31 (×4): 50 mg via ORAL
  Filled 2022-01-28 (×2): qty 1
  Filled 2022-01-28: qty 7
  Filled 2022-01-28 (×2): qty 1

## 2022-01-28 MED ORDER — ALUM & MAG HYDROXIDE-SIMETH 200-200-20 MG/5ML PO SUSP: 30.0000 mL | ORAL | Status: DC | PRN

## 2022-01-28 NOTE — Progress Notes (Signed)
Per Vivien Presto, patient meets criteria for inpatient treatment. There are no available or appropriate beds at [facility] today. CSW faxed referrals to the following facilities for review: ? ?[facility]  Pending - Request Sent N/A [address] [place] [place] [address] [phone] --  ?[facility]  Pending - Request Sent N/A [address] [place] [address] [phone] --  ?[facility]  Pending - Request Sent N/A [address] [place] [address] [phone] --  ?[facility]  Pending - Request Sent N/A [address] [place], [place] [address] [phone] --  ?[facility]  Pending - Request Sent N/A [address] [place] [address] [phone] --  ?[facility]  Pending - Request Sent N/A [address] [place] [address] [phone] --  ?[facility]  Pending - Request Sent N/A [address] [place] [address] [phone] --  ?[facility]  Pending - Request Sent N/A [address] [place] [address] [phone] --  ?[facility]  Pending - Request Sent N/A [address] [place], [place] [address] [phone] --  ?[facility]  Pending - Request Sent N/A [address] [place] [address] [phone] --  ?[facility]  Pending - Request Sent N/A [address], [place] [address] [phone] --  ?[facility]  Pending - Request Sent N/A [address] [place] [address] [phone] --  ?[facility]  Pending - Request Sent N/A [address] [place], [place] [address] [phone] --  ?[facility]   Pending - Request Sent N/A [address] [place] [address] [phone] --  ?[facility]  Pending - Request Sent N/A [address] [place] [address] [phone] --  ?[facility]  Pending - No Request Sent N/A [address], [place] [address] [phone] --  ?[facility]  Pending - No Request Sent N/A [address] [place] [address] [phone] --  ?[facility]  Pending - No Request Sent N/A [address] [place], [place] [address] [phone] --  ?[facility]  Pending - No Request Sent N/A [address] [place] [address] [phone] --  ?[facility]  Pending - No Request Sent N/A [address] [place] [address] [phone] --  ? ? ?TTS will continue to seek bed placement. ? ?Crissie Reese, MSW, LCSW-A, LCAS-A ?Phone: [phone] ?Disposition/TOC ? ?

## 2022-01-28 NOTE — Consult Note (Signed)
Reason for Consult:  Suicidal ideation ?Referring Physician:  [facility] EDP ?Location of Patient: [facility] ED ?Location of Provider:  Other: Home Office   ? ?[name], 26 y.o., male patient seen via tele health by this provider; chart reviewed and consulted with Dr. Earlene Plater on 01/28/22.  On evaluation John [name] reports he continues to have some passive suicidal ideation.  States he feels that he is getting nothing accomplished in the emergency room and wants to get help so he can get his life back together.  State he need help with his substance abuse and mental health.  Patient reporting he wants to come off of Suboxone where he is currently being treated and prescribed at [facility].  ?During evaluation Jvon Meroney is sitting up in bed he is alert/oriented x 4; calm/cooperative; and mood congruent with affect.  Patient is speaking in a clear tone at moderate volume, and normal pace; with good eye contact.  His thought process is coherent and relevant; There is no indication that he is currently responding to internal/external stimuli or experiencing delusional thought content.  Patient denies homicidal ideation, psychosis, and paranoia.  Patient has remained calm throughout assessment and has answered questions appropriately.  ? ?Recommendation:  Transfer to facility base crisis unit at [facility] ? ?Secure message sent to patients nurse informing of disposition:  ?Disposition: Patient has been accepted to facility base crisis unit  at [facility].  Accepting physician: Dr. Earlene Plater.  Nursing will need to call to see best time for transfer.   ? ?Margeret Stachnik B. Mccall Will, NP  ? ? ? ?This service was provided via telemedicine using a 2-way, interactive audio and video technology. ? ?Names of all persons participating in this telemedicine service and their role in this encounter. ?Name: Sulema Braid Role: NP  ?Name: [name] Role: Patient  ?Name:  Role:   ?Name:  Role:   ?  ? ? ?Aribelle Mccosh,  Np  ?

## 2022-01-28 NOTE — BH Assessment (Signed)
Patient was seen this date to discuss treatment options. Patient is no longer voicing S/I and is interested in being transferred to [facility] per recommendation to assist with ongoing needs. Patient was informed that if he was in agreement to be transferred that his suboxone could not be administered at that facility. Patient reports that he "needs help" although is uncertain if he is wanting to go through withdrawals. Patient reports that he would first like to contact his father to see if he can obtain transportation home. Patient states he may "just want to be discharged." Patient states he will inform his nurse once he has attempted to make contact with his father.      ?

## 2022-01-28 NOTE — ED Notes (Signed)
Pt expressing a need for placement tomorrow. This RN informed pt that information is being sent to possible placement locations. Pt hopeful and respectful. ?

## 2022-01-28 NOTE — ED Notes (Signed)
Pt. Is very excited about getting help and being in recovery. He knows what he needs to do to get better and he seems like he really wants to stay clean and not use again.  ?

## 2022-01-28 NOTE — ED Notes (Signed)
Patient on TTS 

## 2022-01-28 NOTE — ED Provider Notes (Signed)
Behavioral Health Admission H&P ?(FBC & OBS) ? ?Date: 01/29/22 ?Patient Name: John [name] ?MRN: [id number] ?Chief Complaint: No chief complaint on file. ?   ? ?Diagnoses:  ?Final diagnoses:  ?Polysubstance abuse (HCC)  ? ? ?HPI:  John [name] is a 26 y/o male presenting voluntarily to [facility] from [facility] ED after being assessed and recommended for [facility] substance abuse treatment. Patient is alert oriented x4, calm and cooperative and does not appear to be experiencing any internal or external stimuli.  Patient reports he is currently on buprenorphine but states that his goal is to be able to stop buprenorphine.Patient is denying any SI/HI or AVH at this time.   ?Patient will be admitted to [facility] for stabilization and safety. ? ?John [name] Assessment:John [name], 26 y.o., male patient seen via tele health by this provider; chart reviewed and consulted with Dr. Earlene Plater on 01/28/22.  On evaluation John [name] reports he continues to have some passive suicidal ideation.  States he feels that he is getting nothing accomplished in the emergency room and wants to get help so he can get his life back together.  State he need help with his substance abuse and mental health.  Patient reporting he wants to come off of Suboxone where he is currently being treated and prescribed at [facility].  ?During evaluation John [name] is sitting up in bed he is alert/oriented x 4; calm/cooperative; and mood congruent with affect.  Patient is speaking in a clear tone at moderate volume, and normal pace; with good eye contact.  His thought process is coherent and relevant; There is no indication that he is currently responding to internal/external stimuli or experiencing delusional thought content.  Patient denies homicidal ideation, psychosis, and paranoia.  Patient has remained calm throughout assessment and has answered questions appropriately.  ?  ?Recommendation:  Transfer to facility base crisis unit  at [facility] ?  ?Secure message sent to patients nurse informing of disposition:  ?Disposition: Patient has been accepted to facility base crisis unit  at [facility].  Accepting physician: Dr. Earlene Plater.  Nursing will need to call to see best time for transfer.   ? ?PHQ 2-9:   ?Flowsheet Row ED from 01/28/2022 in [facility] ED from 01/24/2022 in [facility] EMERGENCY DEPARTMENT ED from 01/23/2022 in [facility] EMERGENCY DEPARTMENT  ?C-SSRS RISK CATEGORY High Risk High Risk No Risk  ? ?  ?  ? ?Total Time spent with patient: 30 minutes ? ?Musculoskeletal  ?Strength & Muscle Tone: within normal limits ?Gait & Station: normal ?Patient leans: N/A ? ?Psychiatric Specialty Exam  ?Presentation ?General Appearance: Casual ? ?Eye Contact:Good ? ?Speech:Clear and Coherent ? ?Speech Volume:Normal ? ?Handedness:Right ? ? ?Mood and Affect  ?Mood:Depressed; Hopeless ? ?Affect:Appropriate ? ? ?Thought Process  ?Thought Processes:Coherent ? ?Descriptions of Associations:Intact ? ?Orientation:Full (Time, Place and Person) ? ?Thought Content:WDL ? Diagnosis of Schizophrenia or Schizoaffective disorder in past: No ?  ?Hallucinations:Hallucinations: None ? ?Ideas of Reference:None ? ?Suicidal Thoughts:Suicidal Thoughts: No ? ?Homicidal Thoughts:Homicidal Thoughts: No ? ? ?Sensorium  ?Memory:Immediate Good; Recent Good; Remote Good ? ?Judgment:Impaired ? ?Insight:Lacking ? ? ?Executive Functions  ?Concentration:Fair ? ?Attention Span:Fair ? ?Recall:Fair ? ?Fund of Knowledge:Fair ? ?Language:Fair ? ? ?Psychomotor Activity  ?Psychomotor Activity:Psychomotor Activity: Normal ? ? ?Assets  ?Assets:Communication Skills; Desire for Improvement; Physical Health ? ? ?Sleep  ?Sleep:Sleep: Fair ?Number of Hours of Sleep: -1 ? ? ?Nutritional Assessment (For OBS and  FBC admissions only) ?Has the patient had a weight loss or gain of 10 pounds or more in the last 3 months?: No ?Has the  patient had a decrease in food intake/or appetite?: No ?Does the patient have eating habits or behaviors that may be indicators of an eating disorder including binging or inducing vomiting?: No ?Has the patient recently lost weight without trying?: 0 ?Has the patient been eating poorly because of a decreased appetite?: 0 ?Malnutrition Screening Tool Score: 0 ? ? ? ?Physical Exam ?HENT:  ?   Head: Normocephalic and atraumatic.  ?   Nose: Nose normal.  ?Eyes:  ?   Pupils: Pupils are equal, round, and reactive to light.  ?Cardiovascular:  ?   Rate and Rhythm: Normal rate.  ?Pulmonary:  ?   Effort: Pulmonary effort is normal.  ?Abdominal:  ?   General: Abdomen is flat.  ?Musculoskeletal:     ?   General: Normal range of motion.  ?   Cervical back: Normal range of motion.  ?Skin: ?   General: Skin is warm.  ?Neurological:  ?   Mental Status: He is alert and oriented to person, place, and time.  ?Psychiatric:     ?   Attention and Perception: Attention normal.     ?   Mood and Affect: Mood is depressed. Affect is flat.     ?   Speech: Speech normal.     ?   Behavior: Behavior normal.     ?   Thought Content: Thought content does not include suicidal ideation. Thought content does not include suicidal plan.     ?   Cognition and Memory: Cognition normal.     ?   Judgment: Judgment is impulsive.  ? ?Review of Systems  ?Constitutional: Negative.   ?HENT: Negative.    ?Eyes: Negative.   ?Respiratory: Negative.    ?Cardiovascular: Negative.   ?Gastrointestinal: Negative.   ?Musculoskeletal: Negative.   ?Skin: Negative.   ?Neurological: Negative.   ?Endo/Heme/Allergies: Negative.   ?Psychiatric/Behavioral:  Positive for depression and substance abuse.   ? ?Blood pressure 111/61, pulse (!) 112, temperature 98.7 ?F (37.1 ?C), temperature source Oral, resp. rate 18, SpO2 92 %. There is no height or weight on file to calculate BMI. ? ?Past Psychiatric History: [facility] ? ?Is the patient at risk to self? No  ?Has the  patient been a risk to self in the past 6 months? No .    ?Has the patient been a risk to self within the distant past? No   ?Is the patient a risk to others? No   ?Has the patient been a risk to others in the past 6 months? No   ?Has the patient been a risk to others within the distant past? No  ? ?Past Medical History:  ?Past Medical History:  ?Diagnosis Date  ? Acne nodule   ? on going  ? Aggressive behavior 04/13/2019  ? Hepatitis C   ? OD (overdose of drug), intentional self-harm, initial encounter (HCC) 04/13/2019  ? Substance abuse (HCC) 04/13/2019  ? Suicide attempt (HCC) 04/13/2019  ?  ?Past Surgical History:  ?Procedure Laterality Date  ? ORIF FINGER / THUMB FRACTURE    ? ? ?Family History:  ?Family History  ?Problem Relation Age of Onset  ? Bipolar disorder Mother   ? Liver disease Maternal Grandfather   ?     etoh  ? Colon cancer Neg Hx   ? ? ?Social History:  ?Social  History  ? ?Socioeconomic History  ? Marital status: Single  ?  Spouse name: Not on file  ? Number of children: Not on file  ? Years of education: Not on file  ? Highest education level: Not on file  ?Occupational History  ? Not on file  ?Tobacco Use  ? Smoking status: Every Day  ?  Packs/day: 1.00  ?  Years: 5.00  ?  Pack years: 5.00  ?  Types: Cigarettes  ? Smokeless tobacco: Never  ?Vaping Use  ? Vaping Use: Never used  ?Substance and Sexual Activity  ? Alcohol use: Not Currently  ?  Alcohol/week: 6.0 standard drinks  ?  Types: 6 Shots of liquor per week  ?  Comment: history of heavy alcohol use; trying to cut back 09/28/20; no etoh 09/10/20. no etoh 03/23/21.  ? Drug use: Not Currently  ?  Types: Marijuana, Oxycodone, Methamphetamines  ?  Comment: heroin; denied 11/24/20, denied 03/23/21  ? Sexual activity: Yes  ?  Birth control/protection: Condom  ?Other Topics Concern  ? Not on file  ?Social History Narrative  ? Not on file  ? ?Social Determinants of Health  ? ?Financial Resource Strain: Not on file  ?Food Insecurity: Not on file   ?Transportation Needs: Not on file  ?Physical Activity: Not on file  ?Stress: Not on file  ?Social Connections: Not on file  ?Intimate Partner Violence: Not on file  ? ? ?SDOH:  ?SDOH Screenings  ? ?Alcohol Scr

## 2022-01-29 ENCOUNTER — Encounter (HOSPITAL_COMMUNITY): Payer: Self-pay | Admitting: Registered Nurse

## 2022-01-29 DIAGNOSIS — Z79899 Other long term (current) drug therapy: Secondary | ICD-10-CM | POA: Diagnosis not present

## 2022-01-29 DIAGNOSIS — R45851 Suicidal ideations: Secondary | ICD-10-CM | POA: Diagnosis not present

## 2022-01-29 DIAGNOSIS — F1910 Other psychoactive substance abuse, uncomplicated: Secondary | ICD-10-CM | POA: Diagnosis not present

## 2022-01-29 DIAGNOSIS — F17210 Nicotine dependence, cigarettes, uncomplicated: Secondary | ICD-10-CM | POA: Diagnosis not present

## 2022-01-29 LAB — LITHIUM LEVEL: Lithium Lvl: 0.78 mmol/L (ref 0.60–1.20)

## 2022-01-29 MED ORDER — ADULT MULTIVITAMIN W/MINERALS CH
1.0000 | ORAL_TABLET | Freq: Every day | ORAL | Status: DC
  Administered 2022-01-29 – 2022-02-01 (×4): 1 via ORAL
  Filled 2022-01-29: qty 7
  Filled 2022-01-29 (×4): qty 1

## 2022-01-29 MED ORDER — METHOCARBAMOL 500 MG PO TABS: 500.0000 mg | ORAL_TABLET | Freq: Three times a day (TID) | ORAL | Status: DC | PRN

## 2022-01-29 MED ORDER — LOPERAMIDE HCL 2 MG PO CAPS: 2.0000 mg | ORAL_CAPSULE | ORAL | Status: DC | PRN

## 2022-01-29 MED ORDER — GABAPENTIN 300 MG PO CAPS
300.0000 mg | ORAL_CAPSULE | Freq: Three times a day (TID) | ORAL | Status: DC
Start: 2022-01-29 — End: 2022-01-29

## 2022-01-29 MED ORDER — ONDANSETRON HCL 4 MG/2ML IJ SOLN
4.0000 mg | Freq: Three times a day (TID) | INTRAMUSCULAR | Status: DC | PRN
Start: 2022-01-29 — End: 2022-01-29

## 2022-01-29 MED ORDER — PRAZOSIN HCL 2 MG PO CAPS
2.0000 mg | ORAL_CAPSULE | Freq: Every evening | ORAL | Status: DC | PRN
  Administered 2022-01-31: 2 mg via ORAL
  Filled 2022-01-29: qty 7
  Filled 2022-01-29: qty 1

## 2022-01-29 MED ORDER — ONDANSETRON 4 MG PO TBDP: 4.0000 mg | ORAL_TABLET | Freq: Three times a day (TID) | ORAL | Status: DC | PRN

## 2022-01-29 MED ORDER — DICYCLOMINE HCL 20 MG PO TABS: 20.0000 mg | ORAL_TABLET | Freq: Four times a day (QID) | ORAL | Status: DC | PRN

## 2022-01-29 MED ORDER — BACITRACIN ZINC 500 UNIT/GM EX OINT
1.0000 "application " | TOPICAL_OINTMENT | Freq: Every day | CUTANEOUS | Status: DC
  Administered 2022-01-29 – 2022-02-01 (×4): 1 via TOPICAL
  Filled 2022-01-29: qty 28.35

## 2022-01-29 MED ORDER — CLONIDINE HCL 0.1 MG PO TABS
0.1000 mg | ORAL_TABLET | Freq: Four times a day (QID) | ORAL | Status: DC
  Administered 2022-01-29 (×2): 0.1 mg via ORAL
  Filled 2022-01-29 (×4): qty 1

## 2022-01-29 MED ORDER — LITHIUM CARBONATE ER 300 MG PO TBCR
600.0000 mg | EXTENDED_RELEASE_TABLET | Freq: Two times a day (BID) | ORAL | Status: DC
  Administered 2022-01-29 – 2022-02-01 (×7): 600 mg via ORAL
  Filled 2022-01-29 (×3): qty 2
  Filled 2022-01-29: qty 28
  Filled 2022-01-29 (×4): qty 2

## 2022-01-29 MED ORDER — CLONIDINE HCL 0.1 MG PO TABS: 0.1000 mg | ORAL_TABLET | Freq: Every day | ORAL | Status: DC

## 2022-01-29 MED ORDER — NAPROXEN 500 MG PO TABS
500.0000 mg | ORAL_TABLET | Freq: Two times a day (BID) | ORAL | Status: DC | PRN
  Administered 2022-01-29 – 2022-01-30 (×2): 500 mg via ORAL
  Filled 2022-01-29 (×2): qty 1

## 2022-01-29 MED ORDER — NICOTINE 14 MG/24HR TD PT24
14.0000 mg | MEDICATED_PATCH | Freq: Every day | TRANSDERMAL | Status: DC
  Administered 2022-01-29 – 2022-02-01 (×4): 14 mg via TRANSDERMAL
  Filled 2022-01-29 (×4): qty 1

## 2022-01-29 MED ORDER — ONDANSETRON 4 MG PO TBDP
4.0000 mg | ORAL_TABLET | Freq: Four times a day (QID) | ORAL | Status: DC | PRN
  Administered 2022-01-29: 4 mg via ORAL
  Filled 2022-01-29: qty 1

## 2022-01-29 MED ORDER — GABAPENTIN 300 MG PO CAPS: 300.0000 mg | ORAL_CAPSULE | Freq: Three times a day (TID) | ORAL | Status: DC | PRN

## 2022-01-29 MED ORDER — GABAPENTIN 300 MG PO CAPS
300.0000 mg | ORAL_CAPSULE | Freq: Three times a day (TID) | ORAL | Status: DC
  Administered 2022-01-29 – 2022-02-01 (×10): 300 mg via ORAL
  Filled 2022-01-29: qty 1
  Filled 2022-01-29: qty 21
  Filled 2022-01-29 (×9): qty 1

## 2022-01-29 MED ORDER — CLONIDINE HCL 0.1 MG PO TABS: 0.1000 mg | ORAL_TABLET | ORAL | Status: DC

## 2022-01-29 MED ORDER — TRAZODONE HCL 50 MG PO TABS: 50.0000 mg | ORAL_TABLET | Freq: Every day | ORAL | Status: DC

## 2022-01-29 MED ORDER — HYDROXYZINE HCL 25 MG PO TABS: 25.0000 mg | ORAL_TABLET | Freq: Four times a day (QID) | ORAL | Status: DC | PRN

## 2022-01-29 NOTE — ED Notes (Signed)
Snacks given 

## 2022-01-29 NOTE — ED Notes (Signed)
Pt sleeping in no acute distress. RR even and unlabored. Safety maintained. 

## 2022-01-29 NOTE — ED Notes (Signed)
Patient is resting quietly with eyes closed, no S/S of distress. Respirations even and unlabored. Will continue to monitor for safety. ?

## 2022-01-29 NOTE — ED Notes (Signed)
Patient is resting quietly in bed with eyes closed. No S/S of distress. Respirations even and unlabored.  ?

## 2022-01-29 NOTE — ED Notes (Signed)
Patient resting quietly in bed with eyes closed. No S/S of distress. Respirations are even and unlabored will continue top monitor for safety. ?

## 2022-01-29 NOTE — ED Notes (Signed)
Patient is cooperative and very animated to get off Suboxone and all opioids. He took 16 mgs. suboxone this morning and is somewhat concerned that he will go into withdrawal sometime tomorrow. He would like a referral to a facility that will help him to completely detox. He has a wound on his heal that he said came from his shoes, it was a blister, then he wore shoes without socks and the blister broke. It has the appearance of a pressure ulcer. It was treated with bacitracin/zinc and covered with a dressing. Patient is pleasant. He is resting quietly with eyes closed. ?

## 2022-01-29 NOTE — ED Notes (Signed)
Wound care performed to blistered area to L heel without difficulty. Scant purulent drainage size of nickel noted to site. Denies pain to area. No infection or redness noted to surrounding area. Safety maintained. ?

## 2022-01-29 NOTE — ED Provider Notes (Signed)
Behavioral Health Progress Note ? ?Date and Time: 01/29/2022 5:19 PM ?Name: John [name] ?MRN:  [id number] ? ?Subjective: Patient states "I do not want to go to detox, I would rather go to sober living."  Patient reports John [name] has resided in sober living facilities in the past.  John [name] reports John [name] would like to seek sober living facility or [facility] moving forward.  John [name] is insightful today and committed to his recovery. ? ?John [name] complained of nausea, subsided after ondansetron administration. ? ?John [name] is reassessed, face-to-face, by nurse practitioner.  John [name] is reclined in observation area upon my approach, appears asleep.  John [name] is easily awakened.   ? ?Patient is alert and oriented, pleasant and cooperative during assessment. John [name] presents with euthymic mood, congruent affect. John [name] denies suicidal and homicidal ideations. John [name] contracts verbally for safety with this Clinical research associate. ? ?John [name] has approximately nickel sized wound to his left posterior heel.  First noticed this wound, approximately 1 week ago.  John [name] reports this wound stems from a blister that was on his heel, shoe rubbed against blister causing wound.  Wound appears pink with scant yellow-colored drainage.  Wound will be redressed daily with bacitracin applied.  Patient denies pain.  No warmth or redness to surrounding tissue. ? ? John [name]  has normal speech and behavior. John [name] denies auditory and visual hallucinations.  Patient is able to converse coherently with goal-directed thoughts and no distractibility or preoccupation.  John [name] denies paranoia.  Objectively there is no evidence of psychosis/mania or delusional thinking. ? ?Patient endorses average sleep and appetite. ?John [name] is offered support and encouragement. ? ? ? ? ?Diagnosis:  ?Final diagnoses:  ?Polysubstance abuse (HCC)  ? ? ?Total Time spent with patient: 20 minutes ? ?Past Psychiatric History: Substance abuse, ?Past Medical History:  ?Past Medical History:  ?Diagnosis Date  ? Acne nodule   ? on going  ?  Aggressive behavior 04/13/2019  ? Hepatitis C   ? OD (overdose of drug), intentional self-harm, initial encounter (HCC) 04/13/2019  ? Substance abuse (HCC) 04/13/2019  ? Suicide attempt (HCC) 04/13/2019  ?  ?Past Surgical History:  ?Procedure Laterality Date  ? ORIF FINGER / THUMB FRACTURE    ? ?Family History:  ?Family History  ?Problem Relation Age of Onset  ? Bipolar disorder Mother   ? Liver disease Maternal Grandfather   ?     etoh  ? Colon cancer Neg Hx   ? ?Family Psychiatric  History: Mother-bipolar disorder ?Social History:  ?Social History  ? ?Substance and Sexual Activity  ?Alcohol Use Not Currently  ? Alcohol/week: 6.0 standard drinks  ? Types: 6 Shots of liquor per week  ? Comment: history of heavy alcohol use; trying to cut back 09/28/20; no etoh 09/10/20. no etoh 03/23/21.  ?   ?Social History  ? ?Substance and Sexual Activity  ?Drug Use Not Currently  ? Types: Marijuana, Oxycodone, Methamphetamines  ? Comment: heroin; denied 11/24/20, denied 03/23/21  ?  ?Social History  ? ?Socioeconomic History  ? Marital status: Single  ?  Spouse name: Not on file  ? Number of children: Not on file  ? Years of education: Not on file  ? Highest education level: Not on file  ?Occupational History  ? Not on file  ?Tobacco Use  ? Smoking status: Every Day  ?  Packs/day: 1.00  ?  Years: 5.00  ?  Pack years: 5.00  ?  Types: Cigarettes  ? Smokeless tobacco: Never  ?Vaping Use  ? Vaping Use: Never used  ?  Substance and Sexual Activity  ? Alcohol use: Not Currently  ?  Alcohol/week: 6.0 standard drinks  ?  Types: 6 Shots of liquor per week  ?  Comment: history of heavy alcohol use; trying to cut back 09/28/20; no etoh 09/10/20. no etoh 03/23/21.  ? Drug use: Not Currently  ?  Types: Marijuana, Oxycodone, Methamphetamines  ?  Comment: heroin; denied 11/24/20, denied 03/23/21  ? Sexual activity: Yes  ?  Birth control/protection: Condom  ?Other Topics Concern  ? Not on file  ?Social History Narrative  ? Not on file  ? ?Social  Determinants of Health  ? ?Financial Resource Strain: Not on file  ?Food Insecurity: Not on file  ?Transportation Needs: Not on file  ?Physical Activity: Not on file  ?Stress: Not on file  ?Social Connections: Not on file  ? ?SDOH:  ?SDOH Screenings  ? ?Alcohol Screen: Not on file  ?Depression (PHQ2-9): Not on file  ?Financial Resource Strain: Not on file  ?Food Insecurity: Not on file  ?Housing: Not on file  ?Physical Activity: Not on file  ?Social Connections: Not on file  ?Stress: Not on file  ?Tobacco Use: High Risk  ? Smoking Tobacco Use: Every Day  ? Smokeless Tobacco Use: Never  ? Passive Exposure: Not on file  ?Transportation Needs: Not on file  ? ?Additional Social History:  ?  ?  ?  ?  ?  ?  ?  ?  ?  ?  ?  ? ?Sleep: Good ? ?Appetite:  Fair ? ?Current Medications:  ?Current Facility-Administered Medications  ?Medication Dose Route Frequency Provider Last Rate Last Admin  ? alum & mag hydroxide-simeth (MAALOX/MYLANTA) 200-200-20 MG/5ML suspension 30 mL  30 mL Oral Q4H PRN Bobbitt, Shalon E, NP      ? bacitracin ointment 1 application.  1 application. Topical Daily Lenard LanceAllen, Dennys Guin L, FNP   1 application. at 01/29/22 1234  ? cloNIDine (CATAPRES) tablet 0.1 mg  0.1 mg Oral QID Lenard LanceAllen, Rilie Glanz L, FNP   0.1 mg at 01/29/22 1712  ? Followed by  ? [START ON 01/31/2022] cloNIDine (CATAPRES) tablet 0.1 mg  0.1 mg Oral BH-qamhs Lenard LanceAllen, Marcelia Petersen L, FNP      ? Followed by  ? [START ON 02/03/2022] cloNIDine (CATAPRES) tablet 0.1 mg  0.1 mg Oral QAC breakfast Lenard LanceAllen, Enda Santo L, FNP      ? dicyclomine (BENTYL) tablet 20 mg  20 mg Oral Q6H PRN Lenard LanceAllen, Bronda Alfred L, FNP      ? gabapentin (NEURONTIN) capsule 300 mg  300 mg Oral TID Lenard LanceAllen, Hedwig Mcfall L, FNP   300 mg at 01/29/22 1700  ? hydrOXYzine (ATARAX) tablet 25 mg  25 mg Oral Q6H PRN Lenard LanceAllen, Alexandro Line L, FNP      ? lithium carbonate (LITHOBID) CR tablet 600 mg  600 mg Oral BID Bobbitt, Shalon E, NP   600 mg at 01/29/22 1100  ? loperamide (IMODIUM) capsule 2-4 mg  2-4 mg Oral PRN Lenard LanceAllen, Deyana Wnuk L, FNP      ?  magnesium hydroxide (MILK OF MAGNESIA) suspension 30 mL  30 mL Oral Daily PRN Bobbitt, Shalon E, NP      ? methocarbamol (ROBAXIN) tablet 500 mg  500 mg Oral Q8H PRN Lenard LanceAllen, Cathey Fredenburg L, FNP      ? multivitamin with minerals tablet 1 tablet  1 tablet Oral Daily Bobbitt, Shalon E, NP   1 tablet at 01/29/22 1100  ? naproxen (NAPROSYN) tablet 500 mg  500 mg Oral BID PRN Doran HeaterAllen, Elyana Grabski  L, FNP   500 mg at 01/29/22 1230  ? nicotine (NICODERM CQ - dosed in mg/24 hours) patch 14 mg  14 mg Transdermal Daily Lenard Lance, FNP   14 mg at 01/29/22 1233  ? ondansetron (ZOFRAN-ODT) disintegrating tablet 4 mg  4 mg Oral Q6H PRN Lenard Lance, FNP   4 mg at 01/29/22 1230  ? prazosin (MINIPRESS) capsule 2 mg  2 mg Oral QHS PRN Bobbitt, Shalon E, NP      ? traZODone (DESYREL) tablet 50 mg  50 mg Oral QHS PRN Bobbitt, Shalon E, NP   50 mg at 01/28/22 2357  ? ?Current Outpatient Medications  ?Medication Sig Dispense Refill  ? Buprenorphine HCl-Naloxone HCl 8-2 MG FILM Take 2 Film by mouth daily.    ? gabapentin (NEURONTIN) 300 MG capsule Take 300 mg by mouth 3 (three) times daily as needed (anxiety).    ? lithium carbonate (LITHOBID) 300 MG CR tablet Take 600 mg by mouth 2 (two) times daily.    ? Multiple Vitamin (MULTIVITAMIN WITH MINERALS) TABS tablet Take 1 tablet by mouth daily.    ? prazosin (MINIPRESS) 2 MG capsule Take 2 mg by mouth at bedtime as needed (nightmares).    ? traZODone (DESYREL) 50 MG tablet Take 50 mg by mouth at bedtime.    ? ? ?Labs  ?Lab Results:  ?Admission on 01/24/2022, Discharged on 01/28/2022  ?Component Date Value Ref Range Status  ? SARS Coronavirus 2 by RT PCR 01/24/2022 NEGATIVE  NEGATIVE Final  ? Comment: (NOTE) ?SARS-CoV-2 target nucleic acids are NOT DETECTED. ? ?The SARS-CoV-2 RNA is generally detectable in upper respiratory ?specimens during the acute phase of infection. The lowest ?concentration of SARS-CoV-2 viral copies this assay can detect is ?138 copies/mL. A negative result does not preclude  SARS-Cov-2 ?infection and should not be used as the sole basis for treatment or ?other patient management decisions. A negative result may occur with  ?improper specimen collection/handling, submission of specimen other ?than

## 2022-01-29 NOTE — ED Notes (Signed)
Pt came to nurses station and request upon discharge, to go into an [facility] or [facility]ber [facility] instead of rehab. NP made aware. Plan is to discuss options for placement tomorrow with MD and SW. Pt is agreeable. Safety maintained. ?

## 2022-01-29 NOTE — ED Notes (Signed)
D:  Patient A&Ox4. Denies intent to harm self/others when asked. Denies A/VH. Denies withdrawal sx from Suboxone. Pt observed by NP and RN to have wound to L heel of foot approx size of a nickel. Noted to have scant purulent drainage to site. No odor noted. Area covered in gauze drsg. Awaiting wound care orders. Support and encouragement provided. Routine safety checks conducted according to facility protocol. Encouraged patient to notify staff if thoughts of harm toward self or others arise. Patient verbalize understanding and agreement. Will continue to monitor for safety.  ?   ?

## 2022-01-29 NOTE — ED Notes (Addendum)
Pt sleeping. 

## 2022-01-29 NOTE — ED Notes (Signed)
Pt. Label wasn't reading for EKG but his paper is in his chart ?

## 2022-01-29 NOTE — ED Notes (Signed)
Patient is resting quietly with eyes closed. No S/S of distress. Respirations even and unlabored. Will continue to monitor for safety. 

## 2022-01-30 DIAGNOSIS — F1910 Other psychoactive substance abuse, uncomplicated: Secondary | ICD-10-CM | POA: Diagnosis not present

## 2022-01-30 DIAGNOSIS — F17210 Nicotine dependence, cigarettes, uncomplicated: Secondary | ICD-10-CM | POA: Diagnosis not present

## 2022-01-30 DIAGNOSIS — R45851 Suicidal ideations: Secondary | ICD-10-CM | POA: Diagnosis not present

## 2022-01-30 DIAGNOSIS — Z79899 Other long term (current) drug therapy: Secondary | ICD-10-CM | POA: Diagnosis not present

## 2022-01-30 MED ORDER — CLONIDINE HCL 0.1 MG PO TABS: 0.1000 mg | ORAL_TABLET | ORAL | Status: DC | PRN

## 2022-01-30 NOTE — ED Notes (Signed)
Medication has been given, snack has been given, patient is dining room watching television, no distress noted, will continue to monitor patient for safety ?

## 2022-01-30 NOTE — ED Notes (Signed)
Pt lying in room quietly, no distress noted, will continue to monitor patient for safety ?

## 2022-01-30 NOTE — ED Notes (Signed)
Patient resting quietly in bed with eye closed, No S/S of acute stress. Respirations even and unlabored. Will continue to monitor for safety ?
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
## 2022-01-30 NOTE — Progress Notes (Signed)
Pt is resting quietly. Pt stated that he is feeling better and has mild headache. No distress noted. Pt advised to let staff know of any concerns. Staff will monitor for pt's safety. ?

## 2022-01-30 NOTE — ED Notes (Signed)
Pt appears restless.

## 2022-01-30 NOTE — Progress Notes (Signed)
Pt is presently sleeping. Respirations are even and unlabored. No signs of acute distress noted.administered scheduled meds with no issue. Held scheduled Clonidine per MD order due to low pulse 44. Pt denies pain and current SI/HI/AVH. Staff will monitor for pt's safety.  ?

## 2022-01-30 NOTE — Clinical Social Work Psych Note (Signed)
LCSW Initial Note  ? ? ?LCSW met with John [name] for introduction and to begin discussions regarding treatment and potential discharge planning.  ? ?John [name] presented to the [facility] seeking assistance for suicidal ideations and ongoing polysubstance abuse. John [name] endorsed -"He started use of Heroin at the age of 26 yrs old. IV use. He reports daily use. Duration of use is 3 weeks. Average amt of use is a gram per day. Last use was 01/23/2022 (6pm) and he used .3 grams.- He started use of Fentanyl at the age of 26 yrs old. He reports daily use. Average amt of use is a gram per day. Duration of use is 3 weeks. Last use was 01/23/2022 (6pm) and he used .3 grams. -He started using THC at the age of 26 yrs old. Duration of use has been daily for the past 3 weeks. Average amount of use is reported as ?a couple of joints per day?. Last use was 01/23/2022 @ 6pm (3 joints). -First age of use for Methamphetamine at the age of 26 yrs old. IV use. He reports daily use. Duration of use is 3 weeks. Average amt of use is ? gram-1 gram per day. Last use was 01/22/2022 (6pm) and he used .3 grams.  -First age of use Benzodiazepines (Xanax) started at the age of 26 yrs old. Average amt of use is 3-4 times per week, @ 2 Xanax bars per use. Last use was 3-4 days ago.- First age of use for Alcohol was 26 yrs old. He drinks mostly on weekends. Average amount of use consists of binges. Last use was 4-5 times ago. " - Per [facility] ? ?John [name] is currently homeless. He reports working at an [employer] currently.  ? ? ?John [name] declined any residential substance abuse treatment services at this time. John [name] requested sober-living/transitional living resources. John [name] reports he plans to discharge to a sober-living program and will continue to follow up with outpatient follow up.  ? ? ?LCSW will continue to follow.  ? ? ?Radonna Ricker, MSW, LCSW ?Clinical Education officer, museum Insurance claims handler) ?[facility]   ? ? ? ? ? ? ? ? ? ? ? ? ? ? ? ?Radonna Ricker, MSW, LCSW ?Clinical Education officer, museum Insurance claims handler) ?[facility]  ? ?

## 2022-01-30 NOTE — ED Notes (Signed)
Snacks and another meal given ?

## 2022-01-30 NOTE — Progress Notes (Signed)
Pt is in the dayroom watching TV. No distress noted or concerns voiced. Staff will monitor for pt's safety. ?

## 2022-01-31 DIAGNOSIS — R45851 Suicidal ideations: Secondary | ICD-10-CM | POA: Diagnosis not present

## 2022-01-31 DIAGNOSIS — F1910 Other psychoactive substance abuse, uncomplicated: Secondary | ICD-10-CM | POA: Diagnosis not present

## 2022-01-31 DIAGNOSIS — F17210 Nicotine dependence, cigarettes, uncomplicated: Secondary | ICD-10-CM | POA: Diagnosis not present

## 2022-01-31 DIAGNOSIS — Z79899 Other long term (current) drug therapy: Secondary | ICD-10-CM | POA: Diagnosis not present

## 2022-01-31 MED ORDER — GABAPENTIN 300 MG PO CAPS: 300.0000 mg | ORAL_CAPSULE | Freq: Three times a day (TID) | ORAL | 1 refills | Status: DC

## 2022-01-31 MED ORDER — PRAZOSIN HCL 2 MG PO CAPS: 2.0000 mg | ORAL_CAPSULE | Freq: Every evening | ORAL | 1 refills | Status: DC | PRN

## 2022-01-31 MED ORDER — LITHIUM CARBONATE ER 300 MG PO TBCR: 600.0000 mg | EXTENDED_RELEASE_TABLET | Freq: Two times a day (BID) | ORAL | 1 refills | Status: DC

## 2022-01-31 MED ORDER — TRAZODONE HCL 50 MG PO TABS: 50.0000 mg | ORAL_TABLET | Freq: Every day | ORAL | 1 refills | Status: DC

## 2022-01-31 MED ORDER — BACITRACIN ZINC 500 UNIT/GM EX OINT: 1.0000 "application " | TOPICAL_OINTMENT | Freq: Every day | CUTANEOUS | 0 refills | Status: DC

## 2022-01-31 NOTE — Discharge Instructions (Signed)

## 2022-01-31 NOTE — ED Notes (Signed)
Pt is currently sleeping, no distress noted, environmental check complete, will continue to monitor patient for safety. ? ?

## 2022-01-31 NOTE — Progress Notes (Signed)
Pt is awake. Alert and oriented. Pt did not voice any complaints of pain or discomfort. No signs of acute distress noted. Administered scheduled meds with no issue. Pt denies current SI/HI/AVH. Staff will monitor for pt's safety. ?

## 2022-01-31 NOTE — Progress Notes (Signed)
Pt is in the dayroom watching TV. No distress noted or concerns voiced. Staff will monitor for pt's safety. ?

## 2022-01-31 NOTE — ED Provider Notes (Signed)
FBC/OBS ASAP Discharge Summary ? ?Date and Time: 01/31/2022 6:04 PM  ?Name: John [name]  ?MRN:  [id number]  ? ?Discharge Diagnoses:  ?Final diagnoses:  ?Polysubstance abuse (HCC)  ? ? ?Subjective:  ? ?26 yo male with history of polysubstance abuse, bipolar disorder, depression who [facility] for SI and HI with a plan.Patient remained in the ED for several day and on 3/18, patient reported only passive SI and [facility].  ? ?Patient seen face to face by this provider, consulted with Dr. Lucianne MussKumar; and chart reviewed on 01/31/22.  Per chart review patient has a history of bipolar disorder, anxiety, and opioid use disorder.  He is prescribed lithium 600 mg twice daily, gabapentin 300 mg 3 times daily, prazosin 2 mg nightly. ? ?On admission UDS+amphetamines, cocaine, opiates, THC, benzodiazepines.  COWS score has remained 0.  At this time he denies any opioid withdrawal symptoms. During evaluation John [name] is alert/oriented x4.  He is calm/cooperative with a pleasant affect.  He smiles and states he is excited because [facility]  He makes good eye contact.  His speech is at a moderate rate and tone.  He denies any concerns with appetite or sleep.  He has a euthymic affect.  He denies SI/HI/AVH.  He is not psychotic.  He is logical and has answered questions appropriately.  He is requesting to be discharged in the a.m.  States his grandfather may come and pick him up and take him. ? ?Stay Summary:  ? ?[name]  [facility] and crisis management.  He was placed on a COWS protocol that included as needed clonidine.  His home medications were restarted. Medications, which were tolerated with no adverse reactions.   John [name] was discharged with current medications and was instructed on how to take medications as prescribed; 7-day sample and a 1 month  prescription was provided for lithium 600 mg twice daily, gabapentin 300 mg 3 times daily, presents Zosyn 2 mg nightly and trazodone 50 mg nightly.  Patient was provided outpatient psychiatric resources.  [facility]  States his grandfather will pick him up in the morning. ? ?Upon completion of this admission the John [name] was both mentally and medically stable for discharge denying suicidal/homicidal ideation, auditory/visual/tactile hallucinations, delusional thoughts and paranoia.    ? ?Total Time spent with patient: 30 minutes ? ?Past Psychiatric History: See H&P ?Past Medical History:  ?Past Medical History:  ?Diagnosis Date  ? Acne nodule   ? on going  ? Aggressive behavior 04/13/2019  ? Hepatitis C   ? OD (overdose of drug), intentional self-harm, initial encounter (HCC) 04/13/2019  ? Substance abuse (HCC) 04/13/2019  ? Suicide attempt (HCC) 04/13/2019  ?  ?Past Surgical History:  ?Procedure Laterality Date  ? ORIF FINGER / THUMB FRACTURE    ? ?Family History:  ?Family History  ?Problem Relation Age of Onset  ? Bipolar disorder Mother   ? Liver disease Maternal Grandfather   ?     etoh  ? [name] cancer Neg Hx   ? ?Family Psychiatric History: See H&P ?Social History:  ?Social History  ? ?Substance and Sexual Activity  ?Alcohol Use Not Currently  ? Alcohol/week: 6.0 standard drinks  ? Types: 6 Shots of liquor per week  ? Comment: history of heavy alcohol  use; trying to cut back 09/28/20; no etoh 09/10/20. no etoh 03/23/21.  ?   ?Social History  ? ?Substance and Sexual Activity  ?Drug Use Not Currently  ? Types: Marijuana, Oxycodone, Methamphetamines  ? Comment: heroin; denied 11/24/20, denied 03/23/21  ?  ?Social History  ? ?Socioeconomic History  ? Marital status: Single  ?  Spouse name: Not on file  ? Number of children: Not on file  ? Years of education: Not on file  ? Highest education level: Not on file  ?Occupational History  ? Not on file  ?Tobacco Use  ? Smoking  status: Every Day  ?  Packs/day: 1.00  ?  Years: 5.00  ?  Pack years: 5.00  ?  Types: Cigarettes  ? Smokeless tobacco: Never  ?Vaping Use  ? Vaping Use: Never used  ?Substance and Sexual Activity  ? Alcohol use: Not Currently  ?  Alcohol/week: 6.0 standard drinks  ?  Types: 6 Shots of liquor per week  ?  Comment: history of heavy alcohol use; trying to cut back 09/28/20; no etoh 09/10/20. no etoh 03/23/21.  ? Drug use: Not Currently  ?  Types: Marijuana, Oxycodone, Methamphetamines  ?  Comment: heroin; denied 11/24/20, denied 03/23/21  ? Sexual activity: Yes  ?  Birth control/protection: Condom  ?Other Topics Concern  ? Not on file  ?Social History Narrative  ? Not on file  ? ?Social Determinants of Health  ? ?Financial Resource Strain: Not on file  ?Food Insecurity: Not on file  ?Transportation Needs: Not on file  ?Physical Activity: Not on file  ?Stress: Not on file  ?Social Connections: Not on file  ? ?SDOH:  ?SDOH Screenings  ? ?Alcohol Screen: Not on file  ?Depression (PHQ2-9): Not on file  ?Financial Resource Strain: Not on file  ?Food Insecurity: Not on file  ?Housing: Not on file  ?Physical Activity: Not on file  ?Social Connections: Not on file  ?Stress: Not on file  ?Tobacco Use: High Risk  ? Smoking Tobacco Use: Every Day  ? Smokeless Tobacco Use: Never  ? Passive Exposure: Not on file  ?Transportation Needs: Not on file  ? ? ?Tobacco Cessation:  A prescription for an FDA-approved tobacco cessation medication was offered at discharge and the patient refused ? ?Current Medications:  ?Current Facility-Administered Medications  ?Medication Dose Route Frequency Provider Last Rate Last Admin  ? alum & mag hydroxide-simeth (MAALOX/MYLANTA) 200-200-20 MG/5ML suspension 30 mL  30 mL Oral Q4H PRN Bobbitt, Shalon E, NP      ? bacitracin ointment 1 application.  1 application. Topical Daily Lenard Lance, FNP   1 application. at 01/31/22 0908  ? cloNIDine (CATAPRES) tablet 0.1 mg  0.1 mg Oral Q4H PRN Estella Husk, MD      ? dicyclomine (BENTYL) tablet 20 mg  20 mg Oral Q6H PRN Lenard Lance, FNP      ? gabapentin (NEURONTIN) capsule 300 mg  300 mg Oral TID Lenard Lance, FNP   300 mg at 01/31/22 1717  ? hydrOXYzine (ATARAX) tablet 25 mg  25 mg Oral Q6H PRN Lenard Lance, FNP      ? lithium carbonate (LITHOBID) CR tablet 600 mg  600 mg Oral BID Bobbitt, Shalon E, NP   600 mg at 01/31/22 0859  ? loperamide (IMODIUM) capsule 2-4 mg  2-4 mg Oral PRN Lenard Lance, FNP      ? magnesium hydroxide (MILK OF MAGNESIA) suspension 30 mL  30  mL Oral Daily PRN Bobbitt, Shalon E, NP      ? methocarbamol (ROBAXIN) tablet 500 mg  500 mg Oral Q8H PRN Lenard Lance, FNP      ? multivitamin with minerals tablet 1 tablet  1 tablet Oral Daily Bobbitt, Shalon E, NP   1 tablet at 01/31/22 0859  ? naproxen (NAPROSYN) tablet 500 mg  500 mg Oral BID PRN Lenard Lance, FNP   500 mg at 01/30/22 1146  ? nicotine (NICODERM CQ - dosed in mg/24 hours) patch 14 mg  14 mg Transdermal Daily Lenard Lance, FNP   14 mg at 01/31/22 0901  ? ondansetron (ZOFRAN-ODT) disintegrating tablet 4 mg  4 mg Oral Q6H PRN Lenard Lance, FNP   4 mg at 01/29/22 1230  ? prazosin (MINIPRESS) capsule 2 mg  2 mg Oral QHS PRN Bobbitt, Shalon E, NP      ? traZODone (DESYREL) tablet 50 mg  50 mg Oral QHS PRN Bobbitt, Shalon E, NP   50 mg at 01/30/22 2202  ? ?Current Outpatient Medications  ?Medication Sig Dispense Refill  ? [START ON 02/01/2022] bacitracin ointment Apply 1 application. topically daily. 120 g 0  ? gabapentin (NEURONTIN) 300 MG capsule Take 1 capsule (300 mg total) by mouth 3 (three) times daily. 90 capsule 1  ? lithium carbonate (LITHOBID) 300 MG CR tablet Take 2 tablets (600 mg total) by mouth 2 (two) times daily. 120 tablet 1  ? Multiple Vitamin (MULTIVITAMIN WITH MINERALS) TABS tablet Take 1 tablet by mouth daily.    ? prazosin (MINIPRESS) 2 MG capsule Take 1 capsule (2 mg total) by mouth at bedtime as needed (nightmares). 30 capsule 1  ? traZODone (DESYREL) 50 MG  tablet Take 1 tablet (50 mg total) by mouth at bedtime. 30 tablet 1  ? ? ?PTA Medications: (Not in a hospital admission) ? ? ?Musculoskeletal  ?Strength & Muscle Tone: within normal limits ?Gait & Station: no

## 2022-01-31 NOTE — ED Notes (Signed)
Pt in dayroom having a snack, watching television. Calm and cooperative. No c/o pain or distress. Will continue to monitor for safety ?

## 2022-01-31 NOTE — ED Notes (Signed)
Pt sleeping@this time. Breathing even and unlabored will continue to monitor for safety 
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
## 2022-01-31 NOTE — ED Notes (Addendum)
Pt is currently sleeping, no distress noted, environmental check complete, will continue to monitor patient for safety. ? ?

## 2022-02-01 DIAGNOSIS — R45851 Suicidal ideations: Secondary | ICD-10-CM | POA: Diagnosis not present

## 2022-02-01 DIAGNOSIS — F17210 Nicotine dependence, cigarettes, uncomplicated: Secondary | ICD-10-CM | POA: Diagnosis not present

## 2022-02-01 DIAGNOSIS — F1910 Other psychoactive substance abuse, uncomplicated: Secondary | ICD-10-CM | POA: Diagnosis not present

## 2022-02-01 DIAGNOSIS — Z79899 Other long term (current) drug therapy: Secondary | ICD-10-CM | POA: Diagnosis not present

## 2022-02-01 NOTE — ED Notes (Signed)
Pt sleeping@this time. Breathing even and unlabored will continue to monitor for safety 

## 2022-02-01 NOTE — Clinical Social Work Psych Note (Signed)
LCSW Discharge Note ? ?John [name] reports he is ready for discharge today. John [name] is planning to participate in a sober living program with an [address] in the area. He shared that he has an appointment this evening with the house directors.  ? ?John [name] reports feeling "well" today, he shared that he wanted to sleep until his discharge was ready. He denied having any issues or concerns.  ? ? ?John [name] did express interest in outpatient substance abuse resources. LCSW has provided additional resources.  ? ?John [name] reports that his grandfather is picking him up at 2:00pm.  ? ?John [name] denied having any additional questions or concerns.  ? ? ?LCSW will continue to follow until discharge.  ? ? ?Baldo Daub, MSW, LCSW ?Clinical Child psychotherapist Hydrographic surveyor) ?[facility]  ? ?

## 2022-02-28 ENCOUNTER — Telehealth (HOSPITAL_COMMUNITY): Payer: Self-pay | Admitting: Family Medicine

## 2022-02-28 NOTE — BH Assessment (Signed)
Care Management - BHUC Follow Up Discharges  ? ?Writer attempted to make contact with patient today and was unsuccessful.  Phone just rang, no answer. ? ?Per chart review, patient was provided with outpatient substance abuse resources. ? ?

## 2022-03-04 ENCOUNTER — Other Ambulatory Visit (HOSPITAL_COMMUNITY)
Admission: EM | Admit: 2022-03-04 | Discharge: 2022-03-05 | Disposition: A | Discharge status: Other Acute Inpatient Hospital | Payer: No Payment, Other | Attending: Psychiatry | Admitting: Psychiatry

## 2022-03-04 DIAGNOSIS — Z9151 Personal history of suicidal behavior: Secondary | ICD-10-CM | POA: Insufficient documentation

## 2022-03-04 DIAGNOSIS — F411 Generalized anxiety disorder: Secondary | ICD-10-CM | POA: Diagnosis not present

## 2022-03-04 DIAGNOSIS — R45851 Suicidal ideations: Secondary | ICD-10-CM | POA: Diagnosis not present

## 2022-03-04 DIAGNOSIS — F1110 Opioid abuse, uncomplicated: Secondary | ICD-10-CM | POA: Insufficient documentation

## 2022-03-04 DIAGNOSIS — F1911 Other psychoactive substance abuse, in remission: Secondary | ICD-10-CM | POA: Diagnosis not present

## 2022-03-04 DIAGNOSIS — F1910 Other psychoactive substance abuse, uncomplicated: Secondary | ICD-10-CM | POA: Diagnosis present

## 2022-03-04 DIAGNOSIS — F319 Bipolar disorder, unspecified: Secondary | ICD-10-CM | POA: Insufficient documentation

## 2022-03-04 DIAGNOSIS — Z20822 Contact with and (suspected) exposure to covid-19: Secondary | ICD-10-CM | POA: Insufficient documentation

## 2022-03-04 DIAGNOSIS — F331 Major depressive disorder, recurrent, moderate: Secondary | ICD-10-CM

## 2022-03-04 DIAGNOSIS — Z79899 Other long term (current) drug therapy: Secondary | ICD-10-CM | POA: Insufficient documentation

## 2022-03-04 DIAGNOSIS — Z91148 Patient's other noncompliance with medication regimen for other reason: Secondary | ICD-10-CM | POA: Insufficient documentation

## 2022-03-04 DIAGNOSIS — F1510 Other stimulant abuse, uncomplicated: Secondary | ICD-10-CM | POA: Insufficient documentation

## 2022-03-04 LAB — POCT URINE DRUG SCREEN - MANUAL ENTRY (I-SCREEN)
POC Amphetamine UR: POSITIVE — AB
POC Buprenorphine (BUP): NOT DETECTED
POC Cocaine UR: NOT DETECTED
POC Marijuana UR: POSITIVE — AB
POC Methadone UR: NOT DETECTED
POC Methamphetamine UR: POSITIVE — AB
POC Morphine: NOT DETECTED
POC Oxazepam (BZO): POSITIVE — AB
POC Oxycodone UR: NOT DETECTED
POC Secobarbital (BAR): NOT DETECTED

## 2022-03-04 LAB — POC SARS CORONAVIRUS 2 AG: SARSCOV2ONAVIRUS 2 AG: NEGATIVE

## 2022-03-04 MED ORDER — DICYCLOMINE HCL 20 MG PO TABS: 20.0000 mg | ORAL_TABLET | Freq: Four times a day (QID) | ORAL | Status: DC | PRN

## 2022-03-04 MED ORDER — CLONIDINE HCL 0.1 MG PO TABS
0.1000 mg | ORAL_TABLET | Freq: Four times a day (QID) | ORAL | Status: DC
  Administered 2022-03-04 – 2022-03-05 (×3): 0.1 mg via ORAL
  Filled 2022-03-04 (×3): qty 1

## 2022-03-04 MED ORDER — TRAZODONE HCL 50 MG PO TABS: 50.0000 mg | ORAL_TABLET | Freq: Every evening | ORAL | Status: DC | PRN

## 2022-03-04 MED ORDER — NAPROXEN 500 MG PO TABS: 500.0000 mg | ORAL_TABLET | Freq: Two times a day (BID) | ORAL | Status: DC | PRN

## 2022-03-04 MED ORDER — HYDROXYZINE HCL 25 MG PO TABS
25.0000 mg | ORAL_TABLET | Freq: Four times a day (QID) | ORAL | Status: DC | PRN
  Administered 2022-03-04: 25 mg via ORAL
  Filled 2022-03-04: qty 1

## 2022-03-04 MED ORDER — METHOCARBAMOL 500 MG PO TABS: 500.0000 mg | ORAL_TABLET | Freq: Three times a day (TID) | ORAL | Status: DC | PRN

## 2022-03-04 MED ORDER — CLONIDINE HCL 0.1 MG PO TABS: 0.1000 mg | ORAL_TABLET | Freq: Every day | ORAL | Status: DC

## 2022-03-04 MED ORDER — ONDANSETRON 4 MG PO TBDP: 4.0000 mg | ORAL_TABLET | Freq: Four times a day (QID) | ORAL | Status: DC | PRN

## 2022-03-04 MED ORDER — LOPERAMIDE HCL 2 MG PO CAPS: 2.0000 mg | ORAL_CAPSULE | ORAL | Status: DC | PRN

## 2022-03-04 MED ORDER — ALUM & MAG HYDROXIDE-SIMETH 200-200-20 MG/5ML PO SUSP: 30.0000 mL | ORAL | Status: DC | PRN

## 2022-03-04 MED ORDER — CLONIDINE HCL 0.1 MG PO TABS: 0.1000 mg | ORAL_TABLET | ORAL | Status: DC

## 2022-03-04 MED ORDER — MAGNESIUM HYDROXIDE 400 MG/5ML PO SUSP: 30.0000 mL | Freq: Every day | ORAL | Status: DC | PRN

## 2022-03-04 NOTE — ED Provider Notes (Signed)
Facility Based Crisis Admission H&P ? ?Date: 03/04/22 ?Patient Name: John [name] ?MRN: [id number] ?Chief Complaint: No chief complaint on file. ? I want to get back on my meds   ? ?Diagnoses:  ?Final diagnoses:  ?Polysubstance abuse ([place])  ?Generalized anxiety disorder  ?Suicidal ideation  ?Major depressive disorder, recurrent episode, moderate ([place])  ? ? ?HPI: John [name] is a 26 y/o male presenting to [facility] with suicidal ideation with a plan to over dose on drugs.John [name] has a history of polysubstance abuse, bipolar disorder, anxiety and depression. Patient has been using methamphetamines, heroin and fentanyl intravenously almost daily since his last discharge from [place] on 02/01/22. Patient reports that he last used methamphetamine earlier today and last used heroin and fentanyl a few days ago. The discharge plan was for patient to go to the [facility] a sober living home, but he was only there for about three day and was kicked out for testing positive for marijuana. John [name] reports that he did not have any money to buy his prescriptions subsequently relapsed to using meth, heroin and fentanyl intravenously. Patient reports that he has been staying in hotels and on the street.  ? ?Patient that states that he does not a have a support system, estranged from father, mother deceased and grandparents have been the most supportive in the past. Patient endorses feeling depressed, isolated, hopeless, worthlessness, feelings of guilt, poor sleep, and increased anxiety symptoms. Patient states that he has just been in survival mode. Patient is alert and oriented x4 and does not appear to be responding to any internal or external stimuli.  ? ?Patient's UDS is positive for amphetamines, benzodiazepines, methamphetamines and marijuana.  ?Patient was prescribed lithium 600mg  bid , prazosin 2mg  qhs, gabapentin 300mg  tid and wants to restart those medications. Patient meets criteria for inpatient treatment will be  admitted to [place] for stabilization, crisis management and safety. ? ?PHQ 2-9:   ?Flowsheet Row ED from 01/28/2022 in [facility] ED from 01/24/2022 in [facility] ED from 01/23/2022 in [place]  ?C-SSRS RISK CATEGORY High Risk High Risk No Risk  ? ?  ?  ? ?Total Time spent with patient: 30 minutes ? ?Musculoskeletal  ?Strength & Muscle Tone: within normal limits ?Gait & Station: normal ?Patient leans: N/A ? ?Psychiatric Specialty Exam  ?Presentation ?General Appearance: Casual ? ?Eye Contact:Good ? ?Speech:Clear and Coherent ? ?Speech Volume:Normal ? ?Handedness:Right ? ? ?Mood and Affect  ?Mood:Depressed ? ?Affect:Depressed ? ? ?Thought Process  ?Thought Processes:Coherent ? ?Descriptions of Associations:Intact ? ?Orientation:Full (Time, Place and Person) ? ?Thought Content:WDL ? Diagnosis of Schizophrenia or Schizoaffective disorder in past: No ?  ?Hallucinations:Hallucinations: None ? ?Ideas of Reference:None ? ?Suicidal Thoughts:Suicidal Thoughts: Yes, Active ?SI Active Intent and/or Plan: With Intent; With Plan ? ?Homicidal Thoughts:Homicidal Thoughts: No ? ? ?Sensorium  ?Memory:Remote Good; Recent Good; Immediate Good ? ?Judgment:Good ? ?Insight:Fair ? ? ?Executive Functions  ?Concentration:Good ? ?Attention Span:Good ? ?Recall:Good ? ?[employer] [place] ? ?Language:Good ? ? ?Psychomotor Activity  ?Psychomotor Activity:Psychomotor Activity: Normal ? ? ?Assets  ?Assets:Communication Skills; Desire for Improvement; Physical Health ? ? ?Sleep  ?Sleep:Sleep: Poor ?Number of Hours of Sleep: -1 ? ? ?Nutritional Assessment (For OBS and FBC admissions only) ?Has the patient had a weight loss or gain of 10 pounds or more in the last 3 months?: No ?Has the patient had a decrease in food intake/or appetite?: No ?Does the patient have eating  habits or behaviors that may be indicators of an eating disorder  including binging or inducing vomiting?: No ?Has the patient recently lost weight without trying?: 0 ?Has the patient been eating poorly because of a decreased appetite?: 1 ?Malnutrition Screening Tool Score: 1 ? ? ? ?Physical Exam ?Constitutional:   ?   Appearance: Normal appearance.  ?HENT:  ?   Head: Normocephalic and atraumatic.  ?   Nose: Nose normal.  ?Eyes:  ?   Pupils: Pupils are equal, round, and reactive to light.  ?Cardiovascular:  ?   Rate and Rhythm: Normal rate.  ?Pulmonary:  ?   Effort: Pulmonary effort is normal.  ?Abdominal:  ?   General: Abdomen is flat.  ?Musculoskeletal:     ?   General: Normal range of motion.  ?   Cervical back: Normal range of motion.  ?Skin: ?   General: Skin is warm.  ?Neurological:  ?   Mental Status: He is alert and oriented to person, place, and time.  ?Psychiatric:     ?   Attention and Perception: He is attentive. He does not perceive auditory hallucinations.     ?   Mood and Affect: Mood is anxious and depressed.     ?   Speech: Speech normal.     ?   Behavior: Behavior is cooperative.     ?   Thought Content: Thought content is not delusional. Thought content includes suicidal ideation. Thought content does not include homicidal ideation. Thought content includes suicidal plan. Thought content does not include homicidal plan.     ?   Cognition and Memory: Cognition normal.     ?   Judgment: Judgment is impulsive.  ? ?Review of Systems  ?Constitutional: Negative.   ?HENT: Negative.    ?Eyes: Negative.   ?Respiratory: Negative.    ?Cardiovascular: Negative.   ?Gastrointestinal: Negative.   ?Genitourinary: Negative.   ?Musculoskeletal: Negative.   ?Skin: Negative.   ?Neurological: Negative.   ?Endo/Heme/Allergies: Negative.   ?Psychiatric/Behavioral:  Positive for depression, substance abuse and suicidal ideas.   ? ?Blood pressure (!) 124/91, pulse 64, temperature 98.3 ?F (36.8 ?C), temperature source Oral, resp. rate 18, SpO2 100 %. There is no height or weight on file  to calculate BMI. ? ?Past Psychiatric History: [facility] [place], [place] ? ?Is the patient at risk to self? Yes  ?Has the patient been a risk to self in the past 6 months? Yes .    ?Has the patient been a risk to self within the distant past? No   ?Is the patient a risk to others? No   ?Has the patient been a risk to others in the past 6 months? No   ?Has the patient been a risk to others within the distant past? No  ? ?Past Medical History:  ?Past Medical History:  ?Diagnosis Date  ? Acne nodule   ? on going  ? Aggressive behavior 04/13/2019  ? Hepatitis C   ? OD (overdose of drug), intentional self-harm, initial encounter ([place]) 04/13/2019  ? Substance abuse ([place]) 04/13/2019  ? Suicide attempt ([place]) 04/13/2019  ?  ?Past Surgical History:  ?Procedure Laterality Date  ? ORIF FINGER / THUMB FRACTURE    ? ? ?Family History:  ?Family History  ?Problem Relation Age of Onset  ? Bipolar disorder Mother   ? Liver disease Maternal Grandfather   ?     etoh  ? Colon cancer Neg Hx   ? ? ?Social History:  ?  Social History  ? ?Socioeconomic History  ? Marital status: Single  ?  Spouse name: Not on file  ? Number of children: Not on file  ? Years of education: Not on file  ? Highest education level: Not on file  ?Occupational History  ? Not on file  ?Tobacco Use  ? Smoking status: Every Day  ?  Packs/day: 1.00  ?  Years: 5.00  ?  Pack years: 5.00  ?  Types: Cigarettes  ? Smokeless tobacco: Never  ?Vaping Use  ? Vaping Use: Never used  ?Substance and Sexual Activity  ? Alcohol use: Not Currently  ?  Alcohol/week: 6.0 standard drinks  ?  Types: 6 Shots of liquor per week  ?  Comment: history of heavy alcohol use; trying to cut back 09/28/20; no etoh 09/10/20. no etoh 03/23/21.  ? Drug use: Not Currently  ?  Types: Marijuana, Oxycodone, Methamphetamines  ?  Comment: heroin; denied 11/24/20, denied 03/23/21  ? Sexual activity: Yes  ?  Birth control/protection: Condom  ?Other Topics Concern  ? Not on file  ?Social History Narrative  ?  Not on file  ? ?Social Determinants of Health  ? ?Financial Resource Strain: Not on file  ?Food Insecurity: Not on file  ?Transportation Needs: Not on file  ?Physical Activity: Not on file  ?Stress: Not on file

## 2022-03-04 NOTE — ED Notes (Signed)
Pending 2 hr Covid and transfer to FBC. ?

## 2022-03-05 ENCOUNTER — Encounter (HOSPITAL_COMMUNITY): Payer: Self-pay | Admitting: Psychiatry

## 2022-03-05 ENCOUNTER — Other Ambulatory Visit: Payer: Self-pay

## 2022-03-05 ENCOUNTER — Inpatient Hospital Stay (HOSPITAL_COMMUNITY)
Admission: AD | Admit: 2022-03-05 | Discharge: 2022-03-09 | DRG: 885 | Disposition: A | Discharge status: Home / Self Care | Payer: Federal, State, Local not specified - Other | Attending: Psychiatry | Admitting: Psychiatry

## 2022-03-05 DIAGNOSIS — F1210 Cannabis abuse, uncomplicated: Secondary | ICD-10-CM | POA: Diagnosis present

## 2022-03-05 DIAGNOSIS — K5900 Constipation, unspecified: Secondary | ICD-10-CM | POA: Diagnosis present

## 2022-03-05 DIAGNOSIS — F1911 Other psychoactive substance abuse, in remission: Secondary | ICD-10-CM | POA: Diagnosis not present

## 2022-03-05 DIAGNOSIS — F1113 Opioid abuse with withdrawal: Secondary | ICD-10-CM | POA: Diagnosis present

## 2022-03-05 DIAGNOSIS — F332 Major depressive disorder, recurrent severe without psychotic features: Principal | ICD-10-CM | POA: Diagnosis present

## 2022-03-05 DIAGNOSIS — F410 Panic disorder [episodic paroxysmal anxiety] without agoraphobia: Secondary | ICD-10-CM | POA: Diagnosis present

## 2022-03-05 DIAGNOSIS — F10139 Alcohol abuse with withdrawal, unspecified: Secondary | ICD-10-CM | POA: Diagnosis present

## 2022-03-05 DIAGNOSIS — G4700 Insomnia, unspecified: Secondary | ICD-10-CM | POA: Diagnosis present

## 2022-03-05 DIAGNOSIS — Z9151 Personal history of suicidal behavior: Secondary | ICD-10-CM

## 2022-03-05 DIAGNOSIS — R45851 Suicidal ideations: Secondary | ICD-10-CM | POA: Diagnosis present

## 2022-03-05 DIAGNOSIS — F319 Bipolar disorder, unspecified: Secondary | ICD-10-CM | POA: Diagnosis not present

## 2022-03-05 DIAGNOSIS — Z20822 Contact with and (suspected) exposure to covid-19: Secondary | ICD-10-CM | POA: Diagnosis present

## 2022-03-05 DIAGNOSIS — F39 Unspecified mood [affective] disorder: Secondary | ICD-10-CM | POA: Diagnosis present

## 2022-03-05 DIAGNOSIS — F1510 Other stimulant abuse, uncomplicated: Secondary | ICD-10-CM | POA: Diagnosis present

## 2022-03-05 DIAGNOSIS — Z79899 Other long term (current) drug therapy: Secondary | ICD-10-CM

## 2022-03-05 DIAGNOSIS — Z818 Family history of other mental and behavioral disorders: Secondary | ICD-10-CM

## 2022-03-05 DIAGNOSIS — F411 Generalized anxiety disorder: Secondary | ICD-10-CM | POA: Diagnosis not present

## 2022-03-05 DIAGNOSIS — F17210 Nicotine dependence, cigarettes, uncomplicated: Secondary | ICD-10-CM | POA: Diagnosis present

## 2022-03-05 DIAGNOSIS — F1910 Other psychoactive substance abuse, uncomplicated: Secondary | ICD-10-CM | POA: Diagnosis present

## 2022-03-05 DIAGNOSIS — F4312 Post-traumatic stress disorder, chronic: Secondary | ICD-10-CM | POA: Diagnosis present

## 2022-03-05 LAB — COMPREHENSIVE METABOLIC PANEL
ALT: 24 U/L (ref 0–44)
AST: 23 U/L (ref 15–41)
Albumin: 4.4 g/dL (ref 3.5–5.0)
Alkaline Phosphatase: 73 U/L (ref 38–126)
Anion gap: 9 (ref 5–15)
BUN: 10 mg/dL (ref 6–20)
CO2: 26 mmol/L (ref 22–32)
Calcium: 9.8 mg/dL (ref 8.9–10.3)
Chloride: 104 mmol/L (ref 98–111)
Creatinine, Ser: 1.01 mg/dL (ref 0.61–1.24)
GFR, Estimated: >60 mL/min (ref >60)
Glucose, Bld: 72 mg/dL (ref 70–99)
Potassium: 4.8 mmol/L (ref 3.5–5.1)
Sodium: 139 mmol/L (ref 135–145)
Total Bilirubin: 0.2 mg/dL — ABNORMAL LOW (ref 0.3–1.2)
Total Protein: 6.9 g/dL (ref 6.5–8.1)

## 2022-03-05 LAB — CBC WITH DIFFERENTIAL/PLATELET
Abs Immature Granulocytes: 0.02 10*3/uL (ref 0.00–0.07)
Basophils Absolute: 0 10*3/uL (ref 0.0–0.1)
Basophils Relative: 0 %
Eosinophils Absolute: 0.1 10*3/uL (ref 0.0–0.5)
Eosinophils Relative: 1 %
HCT: 45.8 % (ref 39.0–52.0)
Hemoglobin: 15.6 g/dL (ref 13.0–17.0)
Immature Granulocytes: 0 %
Lymphocytes Relative: 31 %
Lymphs Abs: 2.7 10*3/uL (ref 0.7–4.0)
MCH: 29.5 pg (ref 26.0–34.0)
MCHC: 34.1 g/dL (ref 30.0–36.0)
MCV: 86.6 fL (ref 80.0–100.0)
Monocytes Absolute: 0.6 10*3/uL (ref 0.1–1.0)
Monocytes Relative: 6 %
Neutro Abs: 5.2 10*3/uL (ref 1.7–7.7)
Neutrophils Relative %: 62 %
Platelets: 326 10*3/uL (ref 150–400)
RBC: 5.29 MIL/uL (ref 4.22–5.81)
RDW: 12.4 % (ref 11.5–15.5)
WBC: 8.6 10*3/uL (ref 4.0–10.5)
nRBC: 0 % (ref 0.0–0.2)

## 2022-03-05 LAB — LIPID PANEL
Cholesterol: 143 mg/dL (ref 0–200)
HDL: 59 mg/dL (ref >40)
LDL Cholesterol: 71 mg/dL (ref 0–99)
Total CHOL/HDL Ratio: 2.4 RATIO
Triglycerides: 67 mg/dL (ref <150)
VLDL: 13 mg/dL (ref 0–40)

## 2022-03-05 LAB — LITHIUM LEVEL: Lithium Lvl: 0.3 mmol/L — ABNORMAL LOW (ref 0.60–1.20)

## 2022-03-05 LAB — RESP PANEL BY RT-PCR (FLU A&B, COVID) ARPGX2
Influenza A by PCR: NEGATIVE
Influenza B by PCR: NEGATIVE
SARS Coronavirus 2 by RT PCR: NEGATIVE

## 2022-03-05 MED ORDER — CLONIDINE HCL 0.1 MG PO TABS
0.1000 mg | ORAL_TABLET | ORAL | Status: DC
  Administered 2022-03-08: 0.1 mg via ORAL
  Filled 2022-03-05 (×4): qty 1

## 2022-03-05 MED ORDER — BACITRACIN-NEOMYCIN-POLYMYXIN OINTMENT TUBE
TOPICAL_OINTMENT | Freq: Two times a day (BID) | CUTANEOUS | Status: DC
  Administered 2022-03-06 (×2): 1 via TOPICAL
  Filled 2022-03-05 (×2): qty 14.17

## 2022-03-05 MED ORDER — CLONIDINE HCL 0.1 MG PO TABS
0.1000 mg | ORAL_TABLET | Freq: Four times a day (QID) | ORAL | Status: AC
Start: 2022-03-05 — End: 2022-03-07
  Administered 2022-03-05 – 2022-03-07 (×6): 0.1 mg via ORAL
  Filled 2022-03-05 (×9): qty 1

## 2022-03-05 MED ORDER — PRAZOSIN HCL 2 MG PO CAPS: 2.0000 mg | ORAL_CAPSULE | Freq: Every evening | ORAL | Status: DC | PRN

## 2022-03-05 MED ORDER — HYDROCORTISONE 1 % EX CREA
TOPICAL_CREAM | Freq: Two times a day (BID) | CUTANEOUS | Status: DC
  Administered 2022-03-06 (×2): 1 via TOPICAL
  Filled 2022-03-05 (×2): qty 28

## 2022-03-05 MED ORDER — DIPHENHYDRAMINE-ZINC ACETATE 2-0.1 % EX CREA: TOPICAL_CREAM | Freq: Two times a day (BID) | CUTANEOUS | Status: DC | PRN

## 2022-03-05 MED ORDER — CLONIDINE HCL 0.1 MG PO TABS
0.1000 mg | ORAL_TABLET | Freq: Every day | ORAL | Status: DC
  Filled 2022-03-05: qty 1

## 2022-03-05 MED ORDER — LITHIUM CARBONATE ER 300 MG PO TBCR: 600.0000 mg | EXTENDED_RELEASE_TABLET | Freq: Two times a day (BID) | ORAL | Status: DC

## 2022-03-05 MED ORDER — NAPROXEN 500 MG PO TABS
500.0000 mg | ORAL_TABLET | Freq: Two times a day (BID) | ORAL | Status: DC | PRN
  Administered 2022-03-05 – 2022-03-06 (×2): 500 mg via ORAL
  Filled 2022-03-05 (×2): qty 1

## 2022-03-05 MED ORDER — ONDANSETRON 4 MG PO TBDP: 4.0000 mg | ORAL_TABLET | Freq: Four times a day (QID) | ORAL | Status: DC | PRN

## 2022-03-05 MED ORDER — MAGNESIUM HYDROXIDE 400 MG/5ML PO SUSP: 30.0000 mL | Freq: Every day | ORAL | Status: DC | PRN

## 2022-03-05 MED ORDER — ALUM & MAG HYDROXIDE-SIMETH 200-200-20 MG/5ML PO SUSP: 30.0000 mL | ORAL | Status: DC | PRN

## 2022-03-05 MED ORDER — LITHIUM CARBONATE ER 300 MG PO TBCR
600.0000 mg | EXTENDED_RELEASE_TABLET | Freq: Two times a day (BID) | ORAL | Status: DC
  Administered 2022-03-05: 600 mg via ORAL
  Filled 2022-03-05: qty 2

## 2022-03-05 MED ORDER — METHOCARBAMOL 500 MG PO TABS
500.0000 mg | ORAL_TABLET | Freq: Three times a day (TID) | ORAL | Status: DC | PRN
  Administered 2022-03-05: 500 mg via ORAL
  Filled 2022-03-05 (×2): qty 1

## 2022-03-05 MED ORDER — GABAPENTIN 300 MG PO CAPS
300.0000 mg | ORAL_CAPSULE | Freq: Three times a day (TID) | ORAL | Status: DC
  Administered 2022-03-05: 300 mg via ORAL
  Filled 2022-03-05 (×2): qty 1

## 2022-03-05 MED ORDER — TRAZODONE HCL 50 MG PO TABS: 50.0000 mg | ORAL_TABLET | Freq: Every day | ORAL | Status: DC

## 2022-03-05 MED ORDER — DICYCLOMINE HCL 20 MG PO TABS: 20.0000 mg | ORAL_TABLET | Freq: Four times a day (QID) | ORAL | Status: DC | PRN

## 2022-03-05 MED ORDER — TRAZODONE HCL 50 MG PO TABS
50.0000 mg | ORAL_TABLET | Freq: Every day | ORAL | Status: DC
  Administered 2022-03-05 – 2022-03-08 (×4): 50 mg via ORAL
  Filled 2022-03-05 (×6): qty 1

## 2022-03-05 MED ORDER — BACIT-POLY-NEO HC 1 % EX OINT
TOPICAL_OINTMENT | Freq: Two times a day (BID) | CUTANEOUS | Status: DC
  Filled 2022-03-05: qty 15

## 2022-03-05 MED ORDER — GABAPENTIN 300 MG PO CAPS
300.0000 mg | ORAL_CAPSULE | Freq: Three times a day (TID) | ORAL | Status: DC
  Administered 2022-03-05 – 2022-03-09 (×11): 300 mg via ORAL
  Filled 2022-03-05 (×15): qty 1

## 2022-03-05 MED ORDER — LOPERAMIDE HCL 2 MG PO CAPS: 2.0000 mg | ORAL_CAPSULE | ORAL | Status: DC | PRN

## 2022-03-05 MED ORDER — LITHIUM CARBONATE ER 300 MG PO TBCR
600.0000 mg | EXTENDED_RELEASE_TABLET | Freq: Two times a day (BID) | ORAL | Status: DC
  Administered 2022-03-05 – 2022-03-09 (×8): 600 mg via ORAL
  Filled 2022-03-05 (×11): qty 2

## 2022-03-05 MED ORDER — CHLORDIAZEPOXIDE HCL 5 MG PO CAPS
10.0000 mg | ORAL_CAPSULE | Freq: Three times a day (TID) | ORAL | Status: DC
  Administered 2022-03-05 – 2022-03-06 (×3): 10 mg via ORAL
  Filled 2022-03-05 (×3): qty 2

## 2022-03-05 MED ORDER — HYDROXYZINE HCL 25 MG PO TABS
25.0000 mg | ORAL_TABLET | Freq: Four times a day (QID) | ORAL | Status: DC | PRN
  Administered 2022-03-05 – 2022-03-08 (×4): 25 mg via ORAL
  Filled 2022-03-05 (×4): qty 1

## 2022-03-05 NOTE — Progress Notes (Signed)
Patient ID: John [name], male   DOB: [birth year], 26 y.o.   MRN: [id number] 26 year old male admitted to the adult inpatient unit due to suicidal ideation after presenting to [facility] for suicidal ideation and a plan to act on it. Pt is alert, oriented and able to make needs known. Denies SI, HI and AVH at present. He verbalizes being extremely paranoid the past few weeks but he attributes that to the drugs he has been taking. He states he has been on a downward spiral since being kicked out of [facility] due to being positive for THC. He states he has been living in the woods and using methamphetamines along with other drugs. He has multiple scratches and abrasions all over his body from briars and trees in the woods. He also has a noteable amount of mosquito/bug bites but he was assessed by providers at [facility] and they stated he does not have bed bug bites, lice or scabies. Pt states he wants to get back on his medications and get his life back together. Oriented pt to the unit and his room, pt resting in his bed at this time. Will continue q32min checks.  ?

## 2022-03-05 NOTE — ED Notes (Signed)
Age.... Admitted to [facility] endorsing SI, with plan to overdose on meth and worsening depression. Pt denies SI,  HI, AVH, at present. Patient was cooperative during the admission assessment. Skin assessment complete. Belongings in the locker. Patient oriented to unit and unit rules. Meal and drinks offered to patient.  Patient verbalized agreement to treatment plans. Patient verbally contracts for safety while hospitalized. Will monitor for safety.  ?

## 2022-03-05 NOTE — Progress Notes (Signed)
Patient seen by provider.  He is calm and pleasant on approach.  Patient has multiple bug bites, scratches and minor abrasions on skin.  Mostly located on his arms, legs, back and torso.  He reports living in the woods for several days and states he was bitten by mosquitos.  RN assessed skin and hair.  No lice evidenced.  Patient is sleeping at present.  No complaints and reports that wdl are manageable.  Denies avh shi or plan.  Will monitor and provide safe environment.   ?

## 2022-03-05 NOTE — ED Notes (Signed)
Patient remains asleep in bed without distress or complaint.  Will monitor and meet needs as warranted.   ?

## 2022-03-05 NOTE — Progress Notes (Signed)
Patient assessed by provider and reported feeling suicidal wanting to overdose on heroin.  Patient was accepted at [facility] and will be transferred there asap.  Patient had lIthium level drawn and submitted to lab.  He is clam, pleasant and aware of placement at [facility].  He is in agreement with plan.   ?

## 2022-03-05 NOTE — ED Notes (Signed)
Pt is in the bed sleeping. Respirations are even and unlabored. No acute distress noted. Will continue to monitor for safety. 

## 2022-03-05 NOTE — Discharge Instructions (Signed)
Transfer to [facility] for inpatient hospitalization. Dr. Loleta Chance accepting  ?

## 2022-03-05 NOTE — Progress Notes (Signed)
?   03/04/22 2311  ?BHUC Triage Screening (Walk-ins at [facility] only)  ?How Did You Hear About [place]? Self  ?What Is the Reason for Your Visit/Call Today? John [name] is a 26 year old male presenting as a voluntary walk-in to [facility] complaining of SI with plan to overdose on meth. Patient denied HI. Patient reported relapsing from IV meth usage. Patient is currently homeless. Patient continued to state "I am fighting hard, mentally beat and I feel crazy". Patient had to be redirected several times to answer questions during assessment. Patient reported paranoia, feeling like people are following him or that his family is out to get him. Patient reported worsening depressive symptoms. Patient reported past suicide attempt of slitting wrist after mother passed away 4 years ago. Patient was cooperative during assessment. Patient unable to contract for safety.  ?How Long Has This Been Causing You Problems? 1-6 months  ?Have You Recently Had Any Thoughts About Hurting Yourself? Yes  ?How long ago did you have thoughts about hurting yourself? today  ?Are You Planning to Commit Suicide/Harm Yourself At This time? Yes  ?Have you Recently Had Thoughts About Hurting Someone Karolee Ohs? No  ?Are You Planning To Harm Someone At This Time? No  ?Are you currently experiencing any auditory, visual or other hallucinations? No  ?Have You Used Any Alcohol or Drugs in the Past 24 Hours? Yes  ?How long ago did you use Drugs or Alcohol? today  ?What Did You Use and How Much? meth IV usage  ?Do you have any current medical co-morbidities that require immediate attention? No  ?Clinician description of patient physical appearance/behavior: disheveled / cooperative  ?What Do You Feel Would Help You the Most Today? Treatment for Depression or other mood problem  ?If access to [facility] was not available, would you have sought care in the Emergency Department? Yes  ?Determination of Need Urgent (48 hours)  ?Options For Referral Outpatient  Therapy;Chemical Dependency Intensive Outpatient Therapy (CDIOP);Medication Management  ? ? ?

## 2022-03-05 NOTE — ED Notes (Signed)
Gave Pt food towel and shower stuff and a bag to wash his clothes. Hes now getting into the shower.  ?

## 2022-03-05 NOTE — ED Notes (Signed)
Sandwiches and chips given ?

## 2022-03-05 NOTE — ED Provider Notes (Signed)
FBC/OBS ASAP Discharge Summary ? ?Date and Time: 03/05/2022 2:06 PM  ?Name: John [name]  ?MRN:  [id number]  ? ?Discharge Diagnoses:  ?Final diagnoses:  ?Polysubstance abuse (HCC)  ?Suicidal ideation  ? ? ?Subjective: John [name] 26 y.o., male patient presented to [facility] as a walk in with SI with a plan to overdose and was admitted to the [facility].  SI with a plan to overdose requires a higher level of  care than the FBC. [facility] was notified and patient has been accepted for inpatient psychiatric admission.  ? ?John [name], 26 y.o., male patient seen face to face by this provider, consulted with Dr. Rebecca Eaton; and chart reviewed on 03/05/22. He has a history of polysubstance abuse, bipolar disorder, anxiety and depression.   He is prescribed lithium 600mg  bid , prazosin 2mg  qhs, gabapentin 300mg  tid, but reports non compliance over the past few weeks. Per chart review patient was has multiple admissions for substance abuse. He was most recently discharged from the [facility] on 01/31/2022 for polysubstance abuse. He was discharged at that time and went to [facility]. States he was there for 4 days and relapsed on heroin and fentanyl. He has used daily since that time. States he uses through out the day and the amount depends on how much money he has available and he prefers to inject.  ? ?COWS score has remained 0. UDS positive for amphetamines, benzodiazapine's, methamphetamines, and THC. BAL <10. He has scabbed areas through out his arms and torso (areas where he has picked the skin). One area on his neck and upper back look more like abrasions. Areas do not have any signs of infection, but bacitracin ointment BID will be ordered for affected areas. Lithium level was not ordered on admission, will order this lab now.  ? ?During evaluation John [name] is laying in his bed asleep.  He is easily awakened.  He is alert/oriented x4 and cooperative.  He is inattentive at times.  He is disheveled and makes fair eye contact.   He has normal speech.  He gets agitated with questions. Reports he is having shivers/chills, denies other withdrawal symptoms.  His home medications of lithium, prazosin, and gabapentin were restarted.  He is tolerating medications without any adverse reactions.  He endorses anxiety and depressions with feelings of hopelessness, worthlessness, feelings of guilt, decreased sleep and decreased appetite.  He endorses suicidal ideations with a plan to overdose on heroin and fentanyl.  Reports he attempted to use so much heroin and fentanyl so he would not wake up 2 days ago.  He cannot contract for safety.  Discussed inpatient psychiatric treatment and patient agrees.  He denies AVH/HI.  Objectively he does not appear to be responding to any internal/external stimuli.  He is able to converse coherently but has poor insight and judgment. ?   ?Stay Summary:  ? ?Patient remained cooperative while on the unit.  He is suicidal with a plan, intent, and access to means.  He meets criteria for inpatient psychiatric admission.  [facility] notified and patient has been accepted.  Patient is in agreement ? ?Total Time spent with patient: 45 minutes ? ?Past Psychiatric History: see h&p ?Past Medical History:  ?Past Medical History:  ?Diagnosis Date  ? Acne nodule   ? on going  ? Aggressive behavior 04/13/2019  ? Hepatitis C   ? OD (overdose of drug), intentional self-harm, initial encounter (HCC) 04/13/2019  ? Substance abuse (HCC) 04/13/2019  ? Suicide attempt (HCC)  04/13/2019  ?  ?Past Surgical History:  ?Procedure Laterality Date  ? ORIF FINGER / THUMB FRACTURE    ? ?Family History:  ?Family History  ?Problem Relation Age of Onset  ? Bipolar disorder Mother   ? Liver disease Maternal Grandfather   ?     etoh  ? Colon cancer Neg Hx   ? ?Family Psychiatric History: see h&p ?Social History:  ?Social History  ? ?Substance and Sexual Activity  ?Alcohol Use Not Currently  ? Alcohol/week: 6.0 standard drinks  ? Types: 6 Shots of liquor  per week  ? Comment: history of heavy alcohol use; trying to cut back 09/28/20; no etoh 09/10/20. no etoh 03/23/21.  ?   ?Social History  ? ?Substance and Sexual Activity  ?Drug Use Not Currently  ? Types: Marijuana, Oxycodone, Methamphetamines  ? Comment: heroin; denied 11/24/20, denied 03/23/21  ?  ?Social History  ? ?Socioeconomic History  ? Marital status: Single  ?  Spouse name: Not on file  ? Number of children: Not on file  ? Years of education: Not on file  ? Highest education level: Not on file  ?Occupational History  ? Not on file  ?Tobacco Use  ? Smoking status: Every Day  ?  Packs/day: 1.00  ?  Years: 5.00  ?  Pack years: 5.00  ?  Types: Cigarettes  ? Smokeless tobacco: Never  ?Vaping Use  ? Vaping Use: Never used  ?Substance and Sexual Activity  ? Alcohol use: Not Currently  ?  Alcohol/week: 6.0 standard drinks  ?  Types: 6 Shots of liquor per week  ?  Comment: history of heavy alcohol use; trying to cut back 09/28/20; no etoh 09/10/20. no etoh 03/23/21.  ? Drug use: Not Currently  ?  Types: Marijuana, Oxycodone, Methamphetamines  ?  Comment: heroin; denied 11/24/20, denied 03/23/21  ? Sexual activity: Yes  ?  Birth control/protection: Condom  ?Other Topics Concern  ? Not on file  ?Social History Narrative  ? Not on file  ? ?Social Determinants of Health  ? ?Financial Resource Strain: Not on file  ?Food Insecurity: Not on file  ?Transportation Needs: Not on file  ?Physical Activity: Not on file  ?Stress: Not on file  ?Social Connections: Not on file  ? ?SDOH:  ?SDOH Screenings  ? ?Alcohol Screen: Not on file  ?Depression (PHQ2-9): Not on file  ?Financial Resource Strain: Not on file  ?Food Insecurity: Not on file  ?Housing: Not on file  ?Physical Activity: Not on file  ?Social Connections: Not on file  ?Stress: Not on file  ?Tobacco Use: High Risk  ? Smoking Tobacco Use: Every Day  ? Smokeless Tobacco Use: Never  ? Passive Exposure: Not on file  ?Transportation Needs: Not on file  ? ? ?Tobacco Cessation:   Prescription not provided because: patient being transferred to [facility] ? ?Current Medications:  ?Current Facility-Administered Medications  ?Medication Dose Route Frequency Provider Last Rate Last Admin  ? alum & mag hydroxide-simeth (MAALOX/MYLANTA) 200-200-20 MG/5ML suspension 30 mL  30 mL Oral Q4H PRN Bobbitt, Shalon E, NP      ? cloNIDine (CATAPRES) tablet 0.1 mg  0.1 mg Oral QID Bobbitt, Shalon E, NP   0.1 mg at 03/05/22 1302  ? Followed by  ? [START ON 03/07/2022] cloNIDine (CATAPRES) tablet 0.1 mg  0.1 mg Oral BH-qamhs Bobbitt, Shalon E, NP      ? Followed by  ? [START ON 03/10/2022] cloNIDine (CATAPRES) tablet 0.1 mg  0.1  mg Oral QAC breakfast Bobbitt, Shalon E, NP      ? dicyclomine (BENTYL) tablet 20 mg  20 mg Oral Q6H PRN Bobbitt, Shalon E, NP      ? gabapentin (NEURONTIN) capsule 300 mg  300 mg Oral TID Bobbitt, Shalon E, NP   300 mg at 03/05/22 0934  ? hydrOXYzine (ATARAX) tablet 25 mg  25 mg Oral Q6H PRN Bobbitt, Shalon E, NP   25 mg at 03/04/22 2321  ? lithium carbonate (LITHOBID) CR tablet 600 mg  600 mg Oral BID Bobbitt, Shalon E, NP   600 mg at 03/05/22 0934  ? loperamide (IMODIUM) capsule 2-4 mg  2-4 mg Oral PRN Bobbitt, Shalon E, NP      ? magnesium hydroxide (MILK OF MAGNESIA) suspension 30 mL  30 mL Oral Daily PRN Bobbitt, Shalon E, NP      ? methocarbamol (ROBAXIN) tablet 500 mg  500 mg Oral Q8H PRN Bobbitt, Shalon E, NP      ? ondansetron (ZOFRAN-ODT) disintegrating tablet 4 mg  4 mg Oral Q6H PRN Bobbitt, Shalon E, NP      ? prazosin (MINIPRESS) capsule 2 mg  2 mg Oral QHS PRN Bobbitt, Shalon E, NP      ? traZODone (DESYREL) tablet 50 mg  50 mg Oral QHS Bobbitt, Shalon E, NP      ? ?Current Outpatient Medications  ?Medication Sig Dispense Refill  ? gabapentin (NEURONTIN) 300 MG capsule Take 1 capsule (300 mg total) by mouth 3 (three) times daily. 90 capsule 1  ? lithium carbonate (LITHOBID) 300 MG CR tablet Take 2 tablets (600 mg total) by mouth 2 (two) times daily.    ? prazosin (MINIPRESS)  2 MG capsule Take 1 capsule (2 mg total) by mouth at bedtime as needed (nightmares). 30 capsule 1  ? traZODone (DESYREL) 50 MG tablet Take 1 tablet (50 mg total) by mouth at bedtime. 30 tablet 1  ? ? ?
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
## 2022-03-05 NOTE — Plan of Care (Signed)
?  Problem: Education: ?Goal: Ability to state activities that reduce stress will improve ?Outcome: Progressing ?  ?Problem: Coping: ?Goal: Ability to identify and develop effective coping behavior will improve ?Outcome: Progressing ?  ?Problem: Self-Concept: ?Goal: Ability to identify factors that promote anxiety will improve ?Outcome: Progressing ?Goal: Level of anxiety will decrease ?Outcome: Progressing ?Goal: Ability to modify response to factors that promote anxiety will improve ?Outcome: Progressing ?  ?Problem: Education: ?Goal: Ability to make informed decisions regarding treatment will improve ?Outcome: Progressing ?  ?Problem: Coping: ?Goal: Coping ability will improve ?Outcome: Progressing ?  ?Problem: Health Behavior/Discharge Planning: ?Goal: Identification of resources available to assist in meeting health care needs will improve ?Outcome: Progressing ?  ?Problem: Medication: ?Goal: Compliance with prescribed medication regimen will improve ?Outcome: Progressing ?  ?Problem: Self-Concept: ?Goal: Ability to disclose and discuss suicidal ideas will improve ?Outcome: Progressing ?Goal: Will verbalize positive feelings about self ?Outcome: Progressing ?  ?Problem: Education: ?Goal: Knowledge of [facility] Education information/materials will improve ?Outcome: Progressing ?Goal: Emotional status will improve ?Outcome: Progressing ?Goal: Mental status will improve ?Outcome: Progressing ?Goal: Verbalization of understanding the information provided will improve ?Outcome: Progressing ?  ?Problem: Activity: ?Goal: Interest or engagement in activities will improve ?Outcome: Progressing ?Goal: Sleeping patterns will improve ?Outcome: Progressing ?  ?Problem: Coping: ?Goal: Ability to verbalize frustrations and anger appropriately will improve ?Outcome: Progressing ?Goal: Ability to demonstrate self-control will improve ?Outcome: Progressing ?  ?Problem: Health Behavior/Discharge Planning: ?Goal:  Identification of resources available to assist in meeting health care needs will improve ?Outcome: Progressing ?Goal: Compliance with treatment plan for underlying cause of condition will improve ?Outcome: Progressing ?  ?Problem: Physical Regulation: ?Goal: Ability to maintain clinical measurements within normal limits will improve ?Outcome: Progressing ?  ?Problem: Safety: ?Goal: Periods of time without injury will increase ?Outcome: Progressing ?  ?

## 2022-03-05 NOTE — Tx Team (Signed)
Initial Treatment Plan ?03/05/2022 ?5:20 PM ?[name] ?JIR:[id number] ? ? ? ?PATIENT STRESSORS: ?Financial difficulties   ?Medication change or noncompliance   ?Substance abuse   ? ? ?PATIENT STRENGTHS: ?Ability for insight  ?Capable of independent living  ?Communication skills  ?Motivation for treatment/growth  ?Supportive family/friends  ? ? ?PATIENT IDENTIFIED PROBLEMS: ?  ?  ?  ?  ?  ?  ?  ?  ?  ?  ? ?DISCHARGE CRITERIA:  ?Ability to meet basic life and health needs ?Adequate post-discharge living arrangements ?Motivation to continue treatment in a less acute level of care ? ?PRELIMINARY DISCHARGE PLAN: ?Referrals indicated:  Pt verbalizes he is interested in a long term placement (6 months or less) to get back on his feet and become stable.  ? ?PATIENT/FAMILY INVOLVEMENT: ?This treatment plan has been presented to and reviewed with the patient, John [name]..  The patient has been given the opportunity to ask questions and make suggestions. ? ?Sharen Hint, RN ?03/05/2022, 5:20 PM ?

## 2022-03-06 ENCOUNTER — Encounter (HOSPITAL_COMMUNITY): Payer: Self-pay

## 2022-03-06 DIAGNOSIS — R45851 Suicidal ideations: Secondary | ICD-10-CM

## 2022-03-06 MED ORDER — ADULT MULTIVITAMIN W/MINERALS CH
1.0000 | ORAL_TABLET | Freq: Every day | ORAL | Status: DC
  Administered 2022-03-06 – 2022-03-09 (×4): 1 via ORAL
  Filled 2022-03-06 (×7): qty 1

## 2022-03-06 MED ORDER — LORAZEPAM 1 MG PO TABS: 1.0000 mg | ORAL_TABLET | Freq: Four times a day (QID) | ORAL | Status: DC | PRN

## 2022-03-06 MED ORDER — THIAMINE HCL 100 MG/ML IJ SOLN: 100.0000 mg | Freq: Once | INTRAMUSCULAR | Status: DC

## 2022-03-06 MED ORDER — OLANZAPINE 5 MG PO TABS
5.0000 mg | ORAL_TABLET | Freq: Every day | ORAL | Status: DC
  Administered 2022-03-06 – 2022-03-08 (×3): 5 mg via ORAL
  Filled 2022-03-06 (×5): qty 1

## 2022-03-06 MED ORDER — NICOTINE 21 MG/24HR TD PT24
21.0000 mg | MEDICATED_PATCH | Freq: Every day | TRANSDERMAL | Status: DC
Start: 2022-03-06 — End: 2022-03-09
  Administered 2022-03-06 – 2022-03-09 (×4): 21 mg via TRANSDERMAL
  Filled 2022-03-06 (×5): qty 1

## 2022-03-06 MED ORDER — THIAMINE HCL 100 MG PO TABS
100.0000 mg | ORAL_TABLET | Freq: Every day | ORAL | Status: DC
  Administered 2022-03-07 – 2022-03-09 (×3): 100 mg via ORAL
  Filled 2022-03-06 (×5): qty 1

## 2022-03-06 NOTE — Plan of Care (Signed)
Nurse discussed anxiety, depression and coping skills with patient.  

## 2022-03-06 NOTE — Progress Notes (Signed)
?   03/05/22 2000  ?Psych Admission Type (Psych Patients Only)  ?Admission Status Voluntary  ?Psychosocial Assessment  ?Patient Complaints Anxiety;Depression  ?Eye Contact Brief  ?Facial Expression Anxious  ?Affect Anxious  ?Speech Logical/coherent  ?Interaction Assertive  ?Motor Activity Restless  ?Appearance/Hygiene In scrubs  ?Behavior Characteristics Appropriate to situation;Cooperative  ?Thought Process  ?Coherency WDL  ?Content WDL  ?Delusions None reported or observed  ?Perception WDL  ?Hallucination None reported or observed  ?Judgment Poor  ?Confusion None  ?Danger to Self  ?Current suicidal ideation? Denies  ?Self-Injurious Behavior No self-injurious ideation or behavior indicators observed or expressed   ?Agreement Not to Harm Self Yes  ?Description of Agreement verbal  ?Danger to Others  ?Danger to Others None reported or observed  ? ? ?

## 2022-03-06 NOTE — Group Note (Signed)
Recreation Therapy Group Note ? ? ?Group Topic:Stress Management  ?Group Date: 03/06/2022 ?Start Time: 0930 ?End Time: 0950 ?Facilitators: Caroll Rancher, LRT,CTRS ?Location: [address] Dayroom ? ? ?Goal Area(s) Addresses:  ?Patient will actively participate in stress management techniques presented during session.  ?Patient will successfully identify benefit of practicing stress management post d/c.  ? ?Group Description: Guided Imagery. LRT provided education, instruction, and demonstration on practice of visualization via guided imagery. Patient was asked to participate in the technique introduced during session. LRT debriefed including topics of mindfulness, stress management and specific scenarios each patient could use these techniques. Patients were given suggestions of ways to access scripts post d/c and encouraged to explore Youtube and other apps available on smartphones, tablets, and computers. ? ? ?Affect/Mood: N/A ?  ?Participation Level: Did not attend ?  ? ?Clinical Observations/Individualized Feedback:   ? ? ?Plan: Continue to engage patient in RT group sessions 2-3x/week. ? ? ?Caroll Rancher, LRT,CTRS ?03/06/2022 11:22 AM ?

## 2022-03-06 NOTE — BHH Group Notes (Signed)
PT was informed but did not attend AA. ?

## 2022-03-06 NOTE — Progress Notes (Signed)
Patient became upset because his     BP was 75/61  P84.  Clonidine was not  given.because of BP.   ?Patient demanded all the medicines he could get, ativan, etc.   ?Patient has been laying in bed most of the day sleeping, resting. ?Respirations even and unlabored.  No signs/symptoms of pain/distress noted on patient's face/body movements. ?Safety maintained with 15 minute checks. ? ?Adult nursing staff informed of patient's behavior and talked to patient about medications and behavior.   ?
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
## 2022-03-06 NOTE — Group Note (Signed)
Date:  03/06/2022 ?Time:  11:51 AM ? ?Group Topic/Focus:  ?Orientation:   The focus of this group is to educate the patient on the purpose and policies of crisis stabilization and provide a format to answer questions about their admission.  The group details unit policies and expectations of patients while admitted. ? ? ? ?Participation Level:  Did Not Attend ? ?Participation Quality:   ? ?Affect:   ? ?Cognitive:   ? ?Insight:  ? ?Engagement in Group:   ? ?Modes of Intervention:   ? ?Additional Comments:   ? ?John [name] ?03/06/2022, 11:51 AM ? ?
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
## 2022-03-06 NOTE — Group Note (Signed)
LCSW Group Therapy Note ? ?03/06/2022    1:00 - 2:00 PM ?             ? ?Type of Therapy and Topic:  Group Therapy: Understanding the differences between fear and anxiety / Fear Ladder & Examining the Evidence ? ?Participation Level:  Did not attend ? ? ?Description of Group:   ?In this group session, patients learned how to define and recognize the similarities differences between fear and anxiety. Patients will explore reactions we have to fear and anxiety. Patients identified a fear or something they feel anxious about. Patients analyzed scenarios and discussed both the positive and negative aspects to them. Patients were asked to identify if it is a fear or anxiety as well.  Patients were asked to provide their own examples. Patients will learn what to do for both fear and anxiety. CSW provided tools such as breaking things up into smaller steps, challenging automatic negative thinking / questions to ask, fear ladder and how to use gradual exposure. Patients will be asked to practice each tool with situations and to complete a fear ladder for their personal gain.  ? ?Therapeutic Goals: ?Patients will learn the difference between fear versus anxiety and the definitions of both. ?Patients will utilize scenarios and be able to identify if this is an example of a fear or anxiety. ?Patients will learn tools to handle both their fears and anxieties as well as discuss breaking it into smaller steps and exposure.  ?Patients will be asked to provide examples of their own and discuss how things have both positive and negative aspects.  ?Patients were provided questions to ask to challenge automatic negative thinking for anxiety.  ?Patients were provided a sample form of a fear ladder and asked to complete one for a fear.  ? ?Summary of Patient Progress:  Did not attend ? ?Therapeutic Modalities:   ?Cognitive Behavioral Therapy ?Motivational Interviewing  ?Brief Therapy ? ?Anthonio Mizzell E Blakelee Allington, LCSW  ?03/06/2022 2:14 PM    ?

## 2022-03-06 NOTE — B[facility] Group Notes (Signed)
Spiritual care group on grief and loss facilitated by chaplain Katy Kendal Raffo, [facility]   Group Goal:   Support / Education around grief and loss   Members engage in facilitated group support and psycho-social education.   Group Description:   Following introductions and group rules, group members engaged in facilitated group dialog and support around topic of loss, with particular support around experiences of loss in their lives. Group Identified types of loss (relationships / self / things) and identified patterns, circumstances, and changes that precipitate losses. Reflected on thoughts / feelings around loss, normalized grief responses, and recognized variety in grief experience. Group noted Worden's four tasks of grief in discussion.   Group drew on Adlerian / Rogerian, narrative, MI,   Patient Progress: Did not attend.  

## 2022-03-06 NOTE — BH IP Treatment Plan (Signed)
Interdisciplinary Treatment and Diagnostic Plan Update ? ?03/06/2022 ?Time of Session: 10:00am ?John [name] ?MRN: [id number] ? ?Principal Diagnosis: Suicidal ideation ? ?Secondary Diagnoses: Principal Problem: ?  Suicidal ideation ?Active Problems: ?  Polysubstance abuse ([place]) ?  Generalized anxiety disorder ?  Chronic post-traumatic stress disorder (PTSD) ? ? ?Current Medications:  ?Current Facility-Administered Medications  ?Medication Dose Route Frequency Provider Last Rate Last Admin  ? alum & mag hydroxide-simeth (MAALOX/MYLANTA) 200-200-20 MG/5ML suspension 30 mL  30 mL Oral Q4H PRN Revonda Humphrey, NP      ? chlordiazePOXIDE (LIBRIUM) capsule 10 mg  10 mg Oral TID Maida Sale, MD   10 mg at 03/06/22 5427  ? cloNIDine (CATAPRES) tablet 0.1 mg  0.1 mg Oral QID Revonda Humphrey, NP   0.1 mg at 03/06/22 0623  ? Followed by  ? [START ON 03/07/2022] cloNIDine (CATAPRES) tablet 0.1 mg  0.1 mg Oral BH-qamhs Revonda Humphrey, NP      ? Followed by  ? [START ON 03/10/2022] cloNIDine (CATAPRES) tablet 0.1 mg  0.1 mg Oral QAC breakfast Revonda Humphrey, NP      ? dicyclomine (BENTYL) tablet 20 mg  20 mg Oral Q6H PRN Revonda Humphrey, NP      ? diphenhydrAMINE-zinc acetate (BENADRYL) 2-0.1 % cream   Topical BID PRN Maida Sale, MD      ? gabapentin (NEURONTIN) capsule 300 mg  300 mg Oral TID Revonda Humphrey, NP   300 mg at 03/06/22 7628  ? hydrocortisone cream 1 %   Topical BID Revonda Humphrey, NP   1 application. at 03/06/22 0820  ? hydrOXYzine (ATARAX) tablet 25 mg  25 mg Oral Q6H PRN Revonda Humphrey, NP   25 mg at 03/05/22 1842  ? lithium carbonate (LITHOBID) CR tablet 600 mg  600 mg Oral BID Revonda Humphrey, NP   600 mg at 03/06/22 3151  ? loperamide (IMODIUM) capsule 2-4 mg  2-4 mg Oral PRN Revonda Humphrey, NP      ? magnesium hydroxide (MILK OF MAGNESIA) suspension 30 mL  30 mL Oral Daily PRN Revonda Humphrey, NP      ? methocarbamol (ROBAXIN) tablet 500 mg  500 mg Oral  Q8H PRN Revonda Humphrey, NP   500 mg at 03/05/22 1841  ? naproxen (NAPROSYN) tablet 500 mg  500 mg Oral BID PRN Revonda Humphrey, NP   500 mg at 03/05/22 1841  ? neomycin-bacitracin-polymyxin (NEOSPORIN) ointment   Topical BID Revonda Humphrey, NP   1 application. at 03/06/22 7616  ? ondansetron (ZOFRAN-ODT) disintegrating tablet 4 mg  4 mg Oral Q6H PRN Revonda Humphrey, NP      ? traZODone (DESYREL) tablet 50 mg  50 mg Oral QHS Revonda Humphrey, NP   50 mg at 03/05/22 2013  ? ?PTA Medications: ?Medications Prior to Admission  ?Medication Sig Dispense Refill Last Dose  ? gabapentin (NEURONTIN) 300 MG capsule Take 1 capsule (300 mg total) by mouth 3 (three) times daily. 90 capsule 1   ? lithium carbonate (LITHOBID) 300 MG CR tablet Take 2 tablets (600 mg total) by mouth 2 (two) times daily.     ? prazosin (MINIPRESS) 2 MG capsule Take 1 capsule (2 mg total) by mouth at bedtime as needed (nightmares). 30 capsule 1   ? traZODone (DESYREL) 50 MG tablet Take 1 tablet (50 mg total) by mouth at bedtime. 30 tablet 1   ? ? ?Patient Stressors: Museum/gallery curator  difficulties   ?Medication change or noncompliance   ?Substance abuse   ? ?Patient Strengths: Ability for insight  ?Capable of independent living  ?Communication skills  ?Motivation for treatment/growth  ?Supportive family/friends  ? ?Treatment Modalities: Medication Management, Group therapy, Case management,  ?1 to 1 session with clinician, Psychoeducation, Recreational therapy. ? ? ?Physician Treatment Plan for Primary Diagnosis: Suicidal ideation ?Long Term Goal(s):    ? ?Short Term Goals:   ? ?Medication Management: Evaluate patient's response, side effects, and tolerance of medication regimen. ? ?Therapeutic Interventions: 1 to 1 sessions, Unit Group sessions and Medication administration. ? ?Evaluation of Outcomes: Not Met ? ?Physician Treatment Plan for Secondary Diagnosis: Principal Problem: ?  Suicidal ideation ?Active Problems: ?  Polysubstance abuse  ([place]) ?  Generalized anxiety disorder ?  Chronic post-traumatic stress disorder (PTSD) ? ?Long Term Goal(s):    ? ?Short Term Goals:      ? ?Medication Management: Evaluate patient's response, side effects, and tolerance of medication regimen. ? ?Therapeutic Interventions: 1 to 1 sessions, Unit Group sessions and Medication administration. ? ?Evaluation of Outcomes: Not Met ? ? ?RN Treatment Plan for Primary Diagnosis: Suicidal ideation ?Long Term Goal(s): Knowledge of disease and therapeutic regimen to maintain health will improve ? ?Short Term Goals: Ability to remain free from injury will improve, Ability to verbalize frustration and anger appropriately will improve, Ability to demonstrate self-control, Ability to participate in decision making will improve, Ability to disclose and discuss suicidal ideas, Ability to identify and develop effective coping behaviors will improve, and Compliance with prescribed medications will improve ? ?Medication Management: RN will administer medications as ordered by provider, will assess and evaluate patient's response and provide education to patient for prescribed medication. RN will report any adverse and/or side effects to prescribing provider. ? ?Therapeutic Interventions: 1 on 1 counseling sessions, Psychoeducation, Medication administration, Evaluate responses to treatment, Monitor vital signs and CBGs as ordered, Perform/monitor CIWA, COWS, AIMS and Fall Risk screenings as ordered, Perform wound care treatments as ordered. ? ?Evaluation of Outcomes: Not Met ? ? ?LCSW Treatment Plan for Primary Diagnosis: Suicidal ideation ?Long Term Goal(s): Safe transition to appropriate next level of care at discharge, Engage patient in therapeutic group addressing interpersonal concerns. ? ?Short Term Goals: Engage patient in aftercare planning with referrals and resources, Increase social support, Increase ability to appropriately verbalize feelings, Facilitate acceptance of mental  health diagnosis and concerns, Facilitate patient progression through stages of change regarding substance use diagnoses and concerns, Identify triggers associated with mental health/substance abuse issues, and Increase skills for wellness and recovery ? ?Therapeutic Interventions: Assess for all discharge needs, 1 to 1 time with Education officer, museum, Explore available resources and support systems, Assess for adequacy in community support network, Educate family and significant other(s) on suicide prevention, Complete Psychosocial Assessment, Interpersonal group therapy. ? ?Evaluation of Outcomes: Not Met ? ? ?Progress in Treatment: ?Attending groups: Yes. ?Participating in groups: Yes. ?Taking medication as prescribed: Yes. ?Toleration medication: Yes. ?Family/Significant other contact made: No, will contact:  grandmother ?Patient understands diagnosis: Yes. ?Discussing patient identified problems/goals with staff: Yes. ?Medical problems stabilized or resolved: Yes. ?Denies suicidal/homicidal ideation: Yes. ?Issues/concerns per patient self-inventory: No. ? ? ?New problem(s) identified: No, Describe:  none ? ?New Short Term/Long Term Goal(s): detox, medication management for mood stabilization; elimination of SI thoughts; development of comprehensive mental wellness/sobriety plan ? ?Patient Goals:  "To get the right medicine, go through withdrawal, and have a plan for housing or further treatment" ? ? ?Discharge Plan or  Barriers: Patient recently admitted. CSW will continue to follow and assess for appropriate referrals and possible discharge planning.  ? ? ?Reason for Continuation of Hospitalization: Anxiety ?Depression ?Medication stabilization ?Suicidal ideation ?Withdrawal symptoms ? ?Estimated Length of Stay: 3-5 days ? ?Last 3 [place] Suicide Severity Risk Score: ?[facility] Admission (Current) from 03/05/2022 in [facility] [facility] ED from 03/04/2022 in [facility] ED from 01/28/2022 in [facility]  ?C-SSRS RISK CATEGORY High Risk High Risk High Risk  ? ?  ? ? ?Last PHQ 2/9 Scores: ? ?  10/14/2020  ?  2:18 PM  ?Depression screen P

## 2022-03-06 NOTE — Progress Notes (Signed)
D:  Patient's self inventory sheet, patient          sleeps good, sleep medication helpful.  Good appetite, low energy level, poor concentration.  Rated depression, hopeless and anxiety 10.  Withdrawals, tremors, sedation, chilling, cravings, agitation, runny nose, irritability.  SI, contracts for safety.  SI after discharge maybe.  Physical problems, lightheaded, dizziness.  Denied physical pain.  Goal is gain control of my life.  Plans to rest, recover.  Does have discharge plans. ?A:  Medications administered per MD orders.  Emotional support and encouragement given patient. ?R:  Denied SI and HI, contracts for safety.  Denied A/V hallucinations.  Safety maintained with 15 minute checks. ? ?Patient said he does have SI thoughts for after his discharge.   Knows he messed up his and his girlfriend's lives.  Denied HI.  Denied A/V hallucinations. ? ? ?
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
## 2022-03-06 NOTE — Progress Notes (Signed)
?   03/06/22 2200  ?Psych Admission Type (Psych Patients Only)  ?Admission Status Voluntary  ?Psychosocial Assessment  ?Patient Complaints Anxiety;Substance abuse  ?Eye Contact Fair  ?Facial Expression Anxious  ?Affect Anxious  ?Speech Logical/coherent  ?Interaction Assertive  ?Motor Activity Restless  ?Appearance/Hygiene Disheveled  ?Behavior Characteristics Anxious  ?Mood Depressed  ?Aggressive Behavior  ?Effect No apparent injury  ?Thought Process  ?Coherency WDL  ?Content WDL  ?Delusions None reported or observed  ?Perception WDL  ?Hallucination None reported or observed  ?Judgment Poor  ?Confusion None  ?Danger to Self  ?Current suicidal ideation? Denies  ?Danger to Others  ?Danger to Others None reported or observed  ? ? ?

## 2022-03-07 DIAGNOSIS — F39 Unspecified mood [affective] disorder: Secondary | ICD-10-CM | POA: Diagnosis not present

## 2022-03-07 LAB — HEMOGLOBIN A1C
Hgb A1c MFr Bld: 5.3 % (ref 4.8–5.6)
Mean Plasma Glucose: 105.41 mg/dL

## 2022-03-07 NOTE — BHH Group Notes (Signed)
Pt did not attend group. 

## 2022-03-07 NOTE — Group Note (Signed)
Recreation Therapy Group Note ? ? ?Group Topic:Animal Assisted Therapy   ?Group Date: 03/07/2022 ?Start Time: 1430 ?End Time: 1515 ?Facilitators: Caroll Rancher, LRT,CTRS ?Location: [address] Dayroom ? ? ?Animal-Assisted Activity (AAA) Program Checklist/Progress Note ?Patient Eligibility Criteria Checklist & Daily Group note for Rec Tx Intervention ? ?AAA/T Program Assumption of Risk Form signed by Patient/ or Parent Legal Guardian YES ? ?Patient understands their participation is voluntary YES ? ?Group Description: Patients provided opportunity to interact with trained and credentialed [employer] Therapy dog and the community volunteer/dog handler. Patients practiced appropriate animal interaction and were educated on dog safety outside of the hospital in common community settings. Patients were allowed to use dog toys and other items to practice commands, engage the dog in play, and/or complete routine aspects of animal care.  ? ?Education: Charity fundraiser, Health visitor, Communication & Social Skills  ? ? ?Affect/Mood: N/A ?  ?Participation Level: Did not attend ?  ? ?Clinical Observations/Individualized Feedback:   ? ? ?Plan: Continue to engage patient in RT group sessions 2-3x/week. ? ? ?Caroll Rancher, LRT,CTRS ?03/07/2022 4:09 PM ?

## 2022-03-07 NOTE — BHH Suicide Risk Assessment (Signed)
B[facility] INPATIENT:  Family/Significant Other Suicide Prevention Education ? ?Suicide Prevention Education:  ?Education Completed; Elnita Maxwell, [phone] (name of family member/significant other) has been identified by the patient as the family member/significant other with whom the patient will be residing, and identified as the person(s) who will aid the patient in the event of a mental health crisis (suicidal ideations/suicide attempt).  With written consent from the patient, the family member/significant other has been provided the following suicide prevention education, prior to the and/or following the discharge of the patient. ? ?CSW spoke with patient grandmother.  Per grandmother, patient has a long history with substance use and mental health issues.  Mother had manic depression and she feels he may have something similar.  Pt unable to keep a job or be able to function in the world.  Pt was living with a girlfriend who also wasn't that stable but she is unsure about specifics about what led him to hospitalization.  Grandmother unsure his access to guns or weapons but understands suicide prevention education.  ? ?The suicide prevention education provided includes the following: ?Suicide risk factors ?Suicide prevention and interventions ?National Suicide Hotline telephone number ?[facility] assessment telephone number ?[facility] Emergency Assistance 911 ?[place] and/or Residential Mobile Crisis Unit telephone number ? ?Request made of family/significant other to: ?Remove weapons (e.g., guns, rifles, knives), all items previously/currently identified as safety concern.   ?Remove drugs/medications (over-the-counter, prescriptions, illicit drugs), all items previously/currently identified as a safety concern. ? ?The family member/significant other verbalizes understanding of the suicide prevention education information provided.  The family member/significant other agrees to remove the  items of safety concern listed above. ? ?Myan Locatelli E Raedyn Klinck ?03/07/2022, 10:29 AM ?

## 2022-03-07 NOTE — BHH Counselor (Signed)
CSW met with patient and provided him with [facility], [facility] and [facility] [place] numbers.  Patient no longer would like 30 day rehab and states that it is too long.  ? ? ? ?Darrel Gloss, LCSW, LCAS ?Clincal Social Worker  ?[facility] ? ?

## 2022-03-07 NOTE — Progress Notes (Addendum)
[facility] MD Progress Note ? ?03/07/2022 4:18 PM ?John [name]  ?MRN:  [id number] ?Subjective:  Patient is a 26 year old male with past psychiatric history of chronic PTSD, MDD, unspecified episodic mood disorder, and medical history of hepatitis C, liver mass initially presented to [facility] as a walk-in with SI with a plan to overdose and was admitted to [facility].  Patient was transferred to [facility] adult inpatient unit on 03/05/22. ? ?Chart review from last 24 hours-The patient's chart was reviewed and nursing notes were reviewed. Patient discussed in progression rounds with treatment team. MAR was reviewed and Pt is complaint  with scheduled medications except hydrocortisone cream, Neosporin and  thiamine injection and required PRN medications Vistaril x1, naproxen x1 yesterday. ? ?Patient seen this morning. Patient was lying down in his bed and feeling sleepy.  Patient reports that his mood is "ok". He reports improvement in his depression and anxiety .  He slept well last night and reports stable appetite.  Currently, he denies SI, HI, AVH.  He reports that he still has worthless thoughts off-and-on.  He reports withdrawal symptoms including body aches, shivering, runny nose, feeling lethargic, and hot flashes.  He reports some drug cravings.  He has been tolerating his medications well without any side effects.  Denies any concerns. ? ?Principal Problem: Unspecified mood (affective) disorder (HCC) ?Diagnosis: Principal Problem: ?  Unspecified mood (affective) disorder (HCC) ?Active Problems: ?  Suicidal ideation ?  Polysubstance abuse (HCC) ?  Generalized anxiety disorder ?  Chronic post-traumatic stress disorder (PTSD) ? ?Total Time spent with patient: 20 minutes ? ?Past Psychiatric History: see H&P ? ?Past Medical History:  ?Past Medical History:  ?Diagnosis Date  ? Acne nodule   ? on going  ? Aggressive behavior 04/13/2019  ? Hepatitis C   ? OD (overdose of drug), intentional self-harm, initial encounter (HCC) 04/13/2019  ?  Substance abuse (HCC) 04/13/2019  ? Suicide attempt (HCC) 04/13/2019  ?  ?Past Surgical History:  ?Procedure Laterality Date  ? ORIF FINGER / THUMB FRACTURE    ? ?Family History:  ?Family History  ?Problem Relation Age of Onset  ? Bipolar disorder Mother   ? Liver disease Maternal Grandfather   ?     etoh  ? Colon cancer Neg Hx   ? ?Family Psychiatric  History: See H&P ? ?Social History:  ?Social History  ? ?Substance and Sexual Activity  ?Alcohol Use Not Currently  ? Alcohol/week: 6.0 standard drinks  ? Types: 6 Shots of liquor per week  ? Comment: history of heavy alcohol use; trying to cut back 09/28/20; no etoh 09/10/20. no etoh 03/23/21.  ?   ?Social History  ? ?Substance and Sexual Activity  ?Drug Use Not Currently  ? Types: Marijuana, Oxycodone, Methamphetamines  ? Comment: heroin; denied 11/24/20, denied 03/23/21  ?  ?Social History  ? ?Socioeconomic History  ? Marital status: Single  ?  Spouse name: Not on file  ? Number of children: Not on file  ? Years of education: Not on file  ? Highest education level: Not on file  ?Occupational History  ? Not on file  ?Tobacco Use  ? Smoking status: Every Day  ?  Packs/day: 1.00  ?  Years: 5.00  ?  Pack years: 5.00  ?  Types: Cigarettes  ? Smokeless tobacco: Never  ?Vaping Use  ? Vaping Use: Never used  ?Substance and Sexual Activity  ? Alcohol use: Not Currently  ?  Alcohol/week: 6.0 standard drinks  ?  Types: 6 Shots of liquor per week  ?  Comment: history of heavy alcohol use; trying to cut back 09/28/20; no etoh 09/10/20. no etoh 03/23/21.  ? Drug use: Not Currently  ?  Types: Marijuana, Oxycodone, Methamphetamines  ?  Comment: heroin; denied 11/24/20, denied 03/23/21  ? Sexual activity: Yes  ?  Birth control/protection: Condom  ?Other Topics Concern  ? Not on file  ?Social History Narrative  ? Not on file  ? ?Social Determinants of Health  ? ?Financial Resource Strain: Not on file  ?Food Insecurity: Not on file  ?Transportation Needs: Not on file  ?Physical Activity:  Not on file  ?Stress: Not on file  ?Social Connections: Not on file  ? ? ?Sleep: Good ? ?Appetite:  Good ? ?Current Medications: ?Current Facility-Administered Medications  ?Medication Dose Route Frequency Provider Last Rate Last Admin  ? alum & mag hydroxide-simeth (MAALOX/MYLANTA) 200-200-20 MG/5ML suspension 30 mL  30 mL Oral Q4H PRN Ardis Hughs, NP      ? cloNIDine (CATAPRES) tablet 0.1 mg  0.1 mg Oral BH-qamhs Ardis Hughs, NP      ? Followed by  ? [START ON 03/10/2022] cloNIDine (CATAPRES) tablet 0.1 mg  0.1 mg Oral QAC breakfast Ardis Hughs, NP      ? dicyclomine (BENTYL) tablet 20 mg  20 mg Oral Q6H PRN Ardis Hughs, NP      ? diphenhydrAMINE-zinc acetate (BENADRYL) 2-0.1 % cream   Topical BID PRN Roselle Locus, MD      ? gabapentin (NEURONTIN) capsule 300 mg  300 mg Oral TID Ardis Hughs, NP   300 mg at 03/07/22 1422  ? hydrocortisone cream 1 %   Topical BID Ardis Hughs, NP   1 application. at 03/06/22 1720  ? hydrOXYzine (ATARAX) tablet 25 mg  25 mg Oral Q6H PRN Ardis Hughs, NP   25 mg at 03/06/22 1733  ? lithium carbonate (LITHOBID) CR tablet 600 mg  600 mg Oral BID Ardis Hughs, NP   600 mg at 03/07/22 0940  ? loperamide (IMODIUM) capsule 2-4 mg  2-4 mg Oral PRN Ardis Hughs, NP      ? LORazepam (ATIVAN) tablet 1 mg  1 mg Oral Q6H PRN Karsten Ro, MD      ? magnesium hydroxide (MILK OF MAGNESIA) suspension 30 mL  30 mL Oral Daily PRN Ardis Hughs, NP      ? methocarbamol (ROBAXIN) tablet 500 mg  500 mg Oral Q8H PRN Ardis Hughs, NP   500 mg at 03/05/22 1841  ? multivitamin with minerals tablet 1 tablet  1 tablet Oral Daily Karsten Ro, MD   1 tablet at 03/07/22 0941  ? naproxen (NAPROSYN) tablet 500 mg  500 mg Oral BID PRN Ardis Hughs, NP   500 mg at 03/06/22 1733  ? neomycin-bacitracin-polymyxin (NEOSPORIN) ointment   Topical BID Ardis Hughs, NP   1 application. at 03/06/22 1720  ? nicotine (NICODERM CQ -  dosed in mg/24 hours) patch 21 mg  21 mg Transdermal Daily Hill, Shelbie Hutching, MD   21 mg at 03/07/22 5701  ? OLANZapine (ZYPREXA) tablet 5 mg  5 mg Oral QHS Karsten Ro, MD   5 mg at 03/06/22 2147  ? ondansetron (ZOFRAN-ODT) disintegrating tablet 4 mg  4 mg Oral Q6H PRN Ardis Hughs, NP      ? thiamine (B-1) injection 100 mg  100 mg Intramuscular Once Karsten Ro, MD      ?  thiamine tablet 100 mg  100 mg Oral Daily Karsten Rooda, Vandana, MD   100 mg at 03/07/22 0941  ? traZODone (DESYREL) tablet 50 mg  50 mg Oral QHS Ardis Hughsoleman, Carolyn H, NP   50 mg at 03/06/22 2147  ? ? ?Lab Results:  ?Results for orders placed or performed during the hospital encounter of 03/05/22 (from the past 48 hour(s))  ?Hemoglobin A1c     Status: None  ? Collection Time: 03/07/22  6:46 AM  ?Result Value Ref Range  ? Hgb A1c MFr Bld 5.3 4.8 - 5.6 %  ?  Comment: (NOTE) ?Pre diabetes:          5.7%-6.4% ? ?Diabetes:              >6.4% ? ?Glycemic control for   <7.0% ?adults with diabetes ?  ? Mean Plasma Glucose 105.41 mg/dL  ?  Comment: Performed at [facility], [address] [place], [place] [address]  ? ? ?Blood Alcohol level:  ?Lab Results  ?Component Value Date  ? ETH <10 01/24/2022  ? ETH <10 01/23/2022  ? ? ?Metabolic Disorder Labs: ?Lab Results  ?Component Value Date  ? HGBA1C 5.3 03/07/2022  ? MPG 105.41 03/07/2022  ? MPG 111.15 11/24/2018  ? ?No results found for: PROLACTIN ?Lab Results  ?Component Value Date  ? CHOL 143 03/04/2022  ? TRIG 67 03/04/2022  ? HDL 59 03/04/2022  ? CHOLHDL 2.4 03/04/2022  ? VLDL 13 03/04/2022  ? LDLCALC 71 03/04/2022  ? LDLCALC 74 11/24/2018  ? ? ?Physical Findings: ?CIWA:  CIWA-Ar Total: 1 ?COWS:  COWS Total Score: 3 ? ?Musculoskeletal: ?Strength & Muscle Tone: within normal limits ?Gait & Station: normal ?Patient leans: N/A ? ?Psychiatric Specialty Exam: ? ?Presentation  ?General Appearance: Disheveled ? ?Eye Contact:Fair ? ?Speech:Normal Rate ? ?Speech  Volume:Normal ? ?Handedness:Right ? ? ?Mood and Affect  ?Mood:Depressed; Anxious ? ?Affect:Congruent; Constricted ? ? ?Thought Process  ?Thought Processes:Coherent ? ?Descriptions of Associations:Tangential ? ?Orientation:Full (Time, Place and

## 2022-03-07 NOTE — Progress Notes (Signed)
?   03/07/22 0936  ?Psych Admission Type (Psych Patients Only)  ?Admission Status Voluntary  ?Psychosocial Assessment  ?Patient Complaints Anxiety;Nervousness;Substance abuse  ?Eye Contact Brief  ?Facial Expression Anxious  ?Affect Anxious;Irritable  ?Speech Logical/coherent  ?Interaction Assertive  ?Motor Activity Restless  ?Appearance/Hygiene In scrubs  ?Behavior Characteristics Anxious  ?Mood Depressed;Anxious;Irritable  ?Thought Process  ?Coherency WDL  ?Content WDL  ?Delusions None reported or observed  ?Perception WDL  ?Hallucination None reported or observed  ?Judgment Poor  ?Confusion None  ?Danger to Self  ?Current suicidal ideation? Denies  ?Danger to Others  ?Danger to Others None reported or observed  ? ? ?

## 2022-03-07 NOTE — Progress Notes (Signed)
?   03/07/22 0500  ?Sleep  ?Number of Hours 8.5  ? ? ?

## 2022-03-08 DIAGNOSIS — F39 Unspecified mood [affective] disorder: Secondary | ICD-10-CM | POA: Diagnosis not present

## 2022-03-08 NOTE — [facility] Group Notes (Signed)
Patient attended the NA group. ?

## 2022-03-08 NOTE — [facility] Group Notes (Signed)
Adult Psychoeducational Group Note ? ?Date:  03/08/2022 ?Time:  2:53 PM ? ?Group Topic/Focus:  ?Wellness Toolbox:   The focus of this group is to discuss various aspects of wellness, balancing those aspects and exploring ways to increase the ability to experience wellness.  Patients will create a wellness toolbox for use upon discharge. ? ?Participation Level:  Minimal ? ?Participation Quality:  Appropriate ? ?Affect:  Appropriate ? ?Cognitive:  Appropriate ? ?Insight: Appropriate ? ?Engagement in Group:  Engaged ? ?Modes of Intervention:  Activity ? ?Additional Comments:  Patient attended and participated in the relaxation group activity. ? ?Jearl Klinefelter ?03/08/2022, 2:53 PM ?

## 2022-03-08 NOTE — Progress Notes (Addendum)
D:  Patient's self inventory sheet, patient sleeps good, sleep medication helpful.  Good appetite, low energy level, good concentration.  Rated depression, hopeless and anxiety 2.  Denied withdrawals.  Denied SI.  Denied  physical problems.  Denied physical pain.  Goal is discharge.  Plans to talk to MD.  Does have discharge plans. ?A:  Medications administered per MD orders.  Emotional support and encouragement given patient. ?R:  Denied SI and HI, contracts for safety.  Denied A/V hallucinations.  Safety maintained with 15 minute checks. ? ?Patient laid in bed this morning.   ? ?

## 2022-03-08 NOTE — Group Note (Signed)
Recreation Therapy Group Note ? ? ?Group Topic:Stress Management  ?Group Date: 03/08/2022 ?Start Time: 48 ?End Time: 0955 ?Facilitators: Caroll Rancher, LRT,CTRS ?Location: [address] Dayroom ? ? ?Goal Area(s) Addresses:  ?Patient will identify positive stress management techniques. ?Patient will identify benefits of using stress management post d/c. ? ?Group Description:  Meditation.  LRT played a meditation that focused on setting intention for the day.  It also focused on making the best use for your time and not focusing on things from the previous day but to focus on what you can in the now.  Patients were to listen and follow along as meditation played to fully engage.  Patients also learned they could find other stress management techniques from Youtube, apps and the Internet in general. ? ? ?Affect/Mood: N/A ?  ?Participation Level: Did not attend ?  ? ?Clinical Observations/Individualized Feedback:   ? ? ?Plan: Continue to engage patient in RT group sessions 2-3x/week. ? ? ?Caroll Rancher, LRT,CTRS ?03/08/2022 12:07 PM ?

## 2022-03-08 NOTE — Progress Notes (Signed)
?   03/07/22 2115  ?Psych Admission Type (Psych Patients Only)  ?Admission Status Voluntary  ?Psychosocial Assessment  ?Patient Complaints Anxiety;Irritability;Nervousness  ?Eye Contact Fair  ?Facial Expression Animated  ?Affect Anxious;Irritable;Labile  ?Speech Logical/coherent  ?Interaction Assertive  ?Motor Activity Restless  ?Appearance/Hygiene Unremarkable  ?Behavior Characteristics Anxious;Irritable;Restless  ?Mood Irritable;Anxious  ?Thought Process  ?Coherency WDL  ?Content WDL  ?Delusions None reported or observed  ?Perception WDL  ?Hallucination None reported or observed  ?Judgment Poor  ?Confusion None  ?Danger to Self  ?Current suicidal ideation? Denies  ?Danger to Others  ?Danger to Others None reported or observed  ? ? ?

## 2022-03-08 NOTE — Plan of Care (Signed)
Nurse discussed coping skills with patient.  

## 2022-03-08 NOTE — Progress Notes (Signed)
[facility] MD Progress Note ? ?03/08/2022 2:15 PM ?Waldron SessionGabriel Pulley  ?MRN:  [id number] ?Subjective:  Patient is a 26 year old male with past psychiatric history of chronic PTSD, MDD, unspecified episodic mood disorder, and medical history of hepatitis C, liver mass [facility] as a walk-in with SI with a plan to overdose and was [facility].  Patient was [facility] adult [facility] on 03/05/22. ? ?Chart review from last 24 hours-The patient's chart was reviewed and nursing notes were reviewed. Patient discussed in progression rounds with treatment team. MAR was reviewed and Pt is complaint with scheduled medications except hydrocortisone cream, Neosporin and required PRN medications Vistaril x 1 yesterday. Patient slept 7.25 hours last night. ? ?Patient seen this morning. Pt's affect is brighter than yesterday.  Patient reports that his mood is "steady". He reports improvement in his depression and denies any anxiety .  Patient asking for discharge, states that [facility].  Discussed that we need to get lithium level, BMP, TSH before he will be discharged.  Discussed that we ordered it for tomorrow morning.  Recommend to follow-up with PCP/psychiatrist to get regular lithium levels, renal function tests.  He appears irritable states he is not sure if [facility] will keep his bed.  Discussed that he was having passive suicidal ideation yesterday so we will still have to observe him for at least 24 hours.  He reports that he declined clonidine yesterday as he did not need that.  He slept well last night and reports stable appetite. Currently, he denies active or passive SI, HI, AVH.  He denies any paranoia. He denies any withdrawal symptoms.  He has been attending some groups but mainly trying to sleep and rest.  He denies any cravings. He has been tolerating his medications well without any side effects.  Discussed side effects of antipsychotics including tremors,  involuntary movements, stiffness, changes in cholesterol and blood sugar.  He verbalizes understanding. Denies any concerns. No tremors, rigidity or stiffness on examination. ? ?Principal Problem: Unspecified mood (affective) disorder (HCC) ?Diagnosis: Principal Problem: ?  Unspecified mood (affective) disorder (HCC) ?Active Problems: ?  Suicidal ideation ?  Polysubstance abuse (HCC) ?  Generalized anxiety disorder ?  Chronic post-traumatic stress disorder (PTSD) ? ?Total Time spent with patient: 20 minutes ? ?Past Psychiatric History: see H&P ? ?Past Medical History:  ?Past Medical History:  ?Diagnosis Date  ? Acne nodule   ? on going  ? Aggressive behavior 04/13/2019  ? Hepatitis C   ? OD (overdose of drug), intentional self-harm, initial encounter (HCC) 04/13/2019  ? Substance abuse (HCC) 04/13/2019  ? Suicide attempt (HCC) 04/13/2019  ?  ?Past Surgical History:  ?Procedure Laterality Date  ? ORIF FINGER / THUMB FRACTURE    ? ?Family History:  ?Family History  ?Problem Relation Age of Onset  ? Bipolar disorder Mother   ? Liver disease Maternal Grandfather   ?     etoh  ? Colon cancer Neg Hx   ? ?Family Psychiatric  History: See H&P ? ?Social History:  ?Social History  ? ?Substance and Sexual Activity  ?Alcohol Use Not Currently  ? Alcohol/week: 6.0 standard drinks  ? Types: 6 Shots of liquor per week  ? Comment: history of heavy alcohol use; trying to cut back 09/28/20; no etoh 09/10/20. no etoh 03/23/21.  ?   ?Social History  ? ?Substance and Sexual Activity  ?Drug Use Not Currently  ? Types:  Marijuana, Oxycodone, Methamphetamines  ? Comment: heroin; denied 11/24/20, denied 03/23/21  ?  ?Social History  ? ?Socioeconomic History  ? Marital status: Single  ?  Spouse name: Not on file  ? Number of children: Not on file  ? Years of education: Not on file  ? Highest education level: Not on file  ?Occupational History  ? Not on file  ?Tobacco Use  ? Smoking status: Every Day  ?  Packs/day: 1.00  ?  Years: 5.00  ?  Pack  years: 5.00  ?  Types: Cigarettes  ? Smokeless tobacco: Never  ?Vaping Use  ? Vaping Use: Never used  ?Substance and Sexual Activity  ? Alcohol use: Not Currently  ?  Alcohol/week: 6.0 standard drinks  ?  Types: 6 Shots of liquor per week  ?  Comment: history of heavy alcohol use; trying to cut back 09/28/20; no etoh 09/10/20. no etoh 03/23/21.  ? Drug use: Not Currently  ?  Types: Marijuana, Oxycodone, Methamphetamines  ?  Comment: heroin; denied 11/24/20, denied 03/23/21  ? Sexual activity: Yes  ?  Birth control/protection: Condom  ?Other Topics Concern  ? Not on file  ?Social History Narrative  ? Not on file  ? ?Social Determinants of Health  ? ?Financial Resource Strain: Not on file  ?Food Insecurity: Not on file  ?Transportation Needs: Not on file  ?Physical Activity: Not on file  ?Stress: Not on file  ?Social Connections: Not on file  ? ? ?Sleep: Good ? ?Appetite:  Good ? ?Current Medications: ?Current Facility-Administered Medications  ?Medication Dose Route Frequency Provider Last Rate Last Admin  ? alum & mag hydroxide-simeth (MAALOX/MYLANTA) 200-200-20 MG/5ML suspension 30 mL  30 mL Oral Q4H PRN Ardis Hughs, NP      ? cloNIDine (CATAPRES) tablet 0.1 mg  0.1 mg Oral BH-qamhs Ardis Hughs, NP      ? Followed by  ? [START ON 03/10/2022] cloNIDine (CATAPRES) tablet 0.1 mg  0.1 mg Oral QAC breakfast Ardis Hughs, NP      ? dicyclomine (BENTYL) tablet 20 mg  20 mg Oral Q6H PRN Ardis Hughs, NP      ? diphenhydrAMINE-zinc acetate (BENADRYL) 2-0.1 % cream   Topical BID PRN Roselle Locus, MD      ? gabapentin (NEURONTIN) capsule 300 mg  300 mg Oral TID Ardis Hughs, NP   300 mg at 03/08/22 1217  ? hydrocortisone cream 1 %   Topical BID Ardis Hughs, NP   Given at 03/08/22 0900  ? hydrOXYzine (ATARAX) tablet 25 mg  25 mg Oral Q6H PRN Ardis Hughs, NP   25 mg at 03/07/22 2242  ? lithium carbonate (LITHOBID) CR tablet 600 mg  600 mg Oral BID Ardis Hughs, NP   600  mg at 03/08/22 0900  ? loperamide (IMODIUM) capsule 2-4 mg  2-4 mg Oral PRN Ardis Hughs, NP      ? LORazepam (ATIVAN) tablet 1 mg  1 mg Oral Q6H PRN Karsten Ro, MD      ? magnesium hydroxide (MILK OF MAGNESIA) suspension 30 mL  30 mL Oral Daily PRN Ardis Hughs, NP      ? methocarbamol (ROBAXIN) tablet 500 mg  500 mg Oral Q8H PRN Ardis Hughs, NP   500 mg at 03/05/22 1841  ? multivitamin with minerals tablet 1 tablet  1 tablet Oral Daily Karsten Ro, MD   1 tablet at 03/08/22 0900  ? naproxen (  NAPROSYN) tablet 500 mg  500 mg Oral BID PRN Ardis Hughs, NP   500 mg at 03/06/22 1733  ? neomycin-bacitracin-polymyxin (NEOSPORIN) ointment   Topical BID Ardis Hughs, NP   Given at 03/08/22 0900  ? nicotine (NICODERM CQ - dosed in mg/24 hours) patch 21 mg  21 mg Transdermal Daily Hill, Shelbie Hutching, MD   21 mg at 03/08/22 0900  ? OLANZapine (ZYPREXA) tablet 5 mg  5 mg Oral QHS Karsten Ro, MD   5 mg at 03/07/22 2112  ? ondansetron (ZOFRAN-ODT) disintegrating tablet 4 mg  4 mg Oral Q6H PRN Ardis Hughs, NP      ? thiamine (B-1) injection 100 mg  100 mg Intramuscular Once Karsten Ro, MD      ? thiamine tablet 100 mg  100 mg Oral Daily Leone Haven, Cashe Gatt, MD   100 mg at 03/08/22 0900  ? traZODone (DESYREL) tablet 50 mg  50 mg Oral QHS Ardis Hughs, NP   50 mg at 03/07/22 2113  ? ? ?Lab Results:  ?Results for orders placed or performed during the hospital encounter of 03/05/22 (from the past 48 hour(s))  ?Hemoglobin A1c     Status: None  ? Collection Time: 03/07/22  6:46 AM  ?Result Value Ref Range  ? Hgb A1c MFr Bld 5.3 4.8 - 5.6 %  ?  Comment: (NOTE) ?Pre diabetes:          5.7%-6.4% ? ?Diabetes:              >6.4% ? ?Glycemic control for   <7.0% ?adults with diabetes ?  ? Mean Plasma Glucose 105.41 mg/dL  ?  Comment: Performed at [facility], [address] [place], [place] [address]  ? ? ?Blood Alcohol level:  ?Lab Results  ?Component Value Date  ? ETH <10  01/24/2022  ? ETH <10 01/23/2022  ? ? ?Metabolic Disorder Labs: ?Lab Results  ?Component Value Date  ? HGBA1C 5.3 03/07/2022  ? MPG 105.41 03/07/2022  ? MPG 111.15 11/24/2018  ? ?No results found for: PROLACTIN

## 2022-03-08 NOTE — Group Note (Signed)
[facility] LCSW Group Therapy Note ? ?Date/Time: 03/08/2022 @ 1pm ? ?Type of Therapy and Topic:  Group Therapy:  Strengths and Qualities ? ?Participation Level:  Did not attend ? ?Description of Group:   ? In this group patients will be asked to explore and define the terms strength ans qualities.  Patients will be guided to discuss their thoughts, feelings, and behaviors as to where strengths and qualities originate. Participants will then list some of their strengths and qualities related to each subject topic. This group will be process-oriented, with patients participating in exploration of their own experiences as well as giving and receiving support and challenge from other group members. ? ?Therapeutic Goals: ?Patient will identify specific strengths related to their personal life. ?Patient will identify feelings, thoughts, and beliefs about strengths and qualities. ?Patient will identify ways their strengths have been used. Marland Kitchen ?Patient will identify situations where they have helped others or made someone else happy. . ? ?Summary of Patient Progress ?Did not attend  ? ? ? ? ?Therapeutic Modalities:   ?Cognitive Behavioral Therapy ?Solution Focused Therapy ?Motivational Interviewing ?Brief Therapy ? ? ?Hafsa Lohn, LCSW, LCAS ?Clincal Social Worker  ?[facility] ? ? ?

## 2022-03-09 DIAGNOSIS — F39 Unspecified mood [affective] disorder: Secondary | ICD-10-CM

## 2022-03-09 LAB — BASIC METABOLIC PANEL
Anion gap: 3 — ABNORMAL LOW (ref 5–15)
BUN: 20 mg/dL (ref 6–20)
CO2: 27 mmol/L (ref 22–32)
Calcium: 9.1 mg/dL (ref 8.9–10.3)
Chloride: 107 mmol/L (ref 98–111)
Creatinine, Ser: 1.15 mg/dL (ref 0.61–1.24)
GFR, Estimated: >60 mL/min (ref >60)
Glucose, Bld: 78 mg/dL (ref 70–99)
Potassium: 4.2 mmol/L (ref 3.5–5.1)
Sodium: 137 mmol/L (ref 135–145)

## 2022-03-09 LAB — LITHIUM LEVEL: Lithium Lvl: 0.45 mmol/L — ABNORMAL LOW (ref 0.60–1.20)

## 2022-03-09 LAB — TSH: TSH: 3.116 u[IU]/mL (ref 0.350–4.500)

## 2022-03-09 MED ORDER — HYDROXYZINE HCL 25 MG PO TABS: 25.0000 mg | ORAL_TABLET | Freq: Three times a day (TID) | ORAL | 0 refills | Status: DC | PRN

## 2022-03-09 MED ORDER — OLANZAPINE 5 MG PO TABS: 5.0000 mg | ORAL_TABLET | Freq: Every day | ORAL | 0 refills | Status: DC

## 2022-03-09 MED ORDER — THIAMINE HCL 100 MG PO TABS: 100.0000 mg | ORAL_TABLET | Freq: Every day | ORAL | 0 refills | Status: DC

## 2022-03-09 MED ORDER — LITHIUM CARBONATE ER 300 MG PO TBCR
600.0000 mg | EXTENDED_RELEASE_TABLET | Freq: Two times a day (BID) | ORAL | 0 refills | Status: DC
Start: 2022-03-09 — End: 2022-07-28

## 2022-03-09 MED ORDER — ADULT MULTIVITAMIN W/MINERALS CH: 1.0000 | ORAL_TABLET | Freq: Every day | ORAL | 0 refills | Status: AC

## 2022-03-09 MED ORDER — HYDROXYZINE HCL 25 MG PO TABS
25.0000 mg | ORAL_TABLET | Freq: Three times a day (TID) | ORAL | Status: DC | PRN
  Filled 2022-03-09: qty 10

## 2022-03-09 MED ORDER — NICOTINE 21 MG/24HR TD PT24: 21.0000 mg | MEDICATED_PATCH | Freq: Every day | TRANSDERMAL | 0 refills | Status: DC

## 2022-03-09 NOTE — Progress Notes (Signed)
?   03/08/22 2200  ?Psych Admission Type (Psych Patients Only)  ?Admission Status Voluntary  ?Psychosocial Assessment  ?Patient Complaints Anxiety  ?Eye Contact Fair  ?Facial Expression Anxious  ?Affect Appropriate to circumstance  ?Speech Logical/coherent  ?Interaction Assertive  ?Motor Activity Fidgety  ?Appearance/Hygiene Improved  ?Behavior Characteristics Cooperative;Appropriate to situation  ?Mood Pleasant  ?Thought Process  ?Coherency WDL  ?Content WDL  ?Delusions None reported or observed  ?Perception WDL  ?Hallucination None reported or observed  ?Judgment Poor  ?Confusion None  ?Danger to Self  ?Current suicidal ideation? Denies  ?Description of Agreement verbal  ?Danger to Others  ?Danger to Others None reported or observed  ? ? ?

## 2022-03-09 NOTE — Progress Notes (Signed)
[name]  D/C'd Home per MD order.  Discussed with the patient and all questions fully answered. ? Skin clean, dry and intact without evidence of skin break down, no evidence of skin tears noted. ? ?An After Visit Summary was printed and given to the patient. Patient received prescription information to pick his medication up at the pharmacy. ? ?D/c education completed with including follow up instructions, medication list, d/c activities limitations if indicated, with other d/c instructions as indicated by MD - patient able to verbalize understanding, all questions fully answered. Patient denies SI/HI/AVH at D/C. Patient was excited to leave the facility and was very appreciative of the care he received at adult [facility]. Patient received transportation ticket at time of D/C.  ? ? ?Patient escorted to main entrance, and D/C home at 1130.. ? ?Taleigh Gero Matilde Sprang ?03/09/2022 11:40 AM  ?

## 2022-03-09 NOTE — Plan of Care (Signed)
?  Problem: Coping: ?Goal: Ability to identify and develop effective coping behavior will improve ?Outcome: Progressing ?  ?Problem: Self-Concept: ?Goal: Ability to identify factors that promote anxiety will improve ?Outcome: Progressing ?Goal: Level of anxiety will decrease ?Outcome: Progressing ?Goal: Ability to modify response to factors that promote anxiety will improve ?Outcome: Progressing ?  ?

## 2022-03-09 NOTE — Discharge Summary (Addendum)
Physician Discharge Summary Note ? ?Patient:  John [name] is an 26 y.o., male ?MRN:  [id number] ?DOB:  15-Jul-1996 ?Patient phone:  [phone] (home)  ?Patient address:   ?[address] ?[place] [address],  ?Total Time spent with patient: 30 minutes ? ?Date of Admission:  03/05/2022 ?Date of Discharge: 03/09/22 ? ?Reason for Admission:  Patient is a 26 year old male with past psychiatric history of chronic PTSD, MDD, unspecified episodic mood disorder, and medical history of hepatitis C, liver mass initially presented to [facility] as a walk-in with SI with a plan to overdose and was admitted to [facility].  ? ?Principal Problem: Unspecified mood (affective) disorder (HCC) ?Discharge Diagnoses: Principal Problem: ?  Unspecified mood (affective) disorder (HCC) ?Active Problems: ?  Suicidal ideation ?  Polysubstance abuse (HCC) ?  Generalized anxiety disorder ?  Chronic post-traumatic stress disorder (PTSD) ? ? ?Past Psychiatric History: PTSD, ADHD, anxiety, depression, bipolar ?History of multiple admissions ?Suicidal attempt by cutting wrist in 2015-03-27 when mom passed away ?He had been on lithium, Seroquel, Lamictal, Zoloft, Adderall, Klonopin ?He reports that lithium helps him but Seroquel does not help him. ?His most recent medications include lithium 600 mg twice daily, gabapentin 300 mg 3 times daily ? ?Past Medical History:  ?Past Medical History:  ?Diagnosis Date  ? Acne nodule   ? on going  ? Aggressive behavior 04/13/2019  ? Hepatitis C   ? OD (overdose of drug), intentional self-harm, initial encounter (HCC) 04/13/2019  ? Substance abuse (HCC) 04/13/2019  ? Suicide attempt (HCC) 04/13/2019  ?  ?Past Surgical History:  ?Procedure Laterality Date  ? ORIF FINGER / THUMB FRACTURE    ? ?Family History:  ?Family History  ?Problem Relation Age of Onset  ? Bipolar disorder Mother   ? Liver disease Maternal Grandfather   ?     etoh  ? Colon cancer Neg Hx   ? ?Family Psychiatric  History: Mom bipolar, anxiety,  ADHD ?Dad -a lot of mental health problems ?Social History:  ?Social History  ? ?Substance and Sexual Activity  ?Alcohol Use Not Currently  ? Alcohol/week: 6.0 standard drinks  ? Types: 6 Shots of liquor per week  ? Comment: history of heavy alcohol use; trying to cut back 09/28/20; no etoh 09/10/20. no etoh 03/23/21.  ?   ?Social History  ? ?Substance and Sexual Activity  ?Drug Use Not Currently  ? Types: Marijuana, Oxycodone, Methamphetamines  ? Comment: heroin; denied 11/24/20, denied 03/23/21  ?  ?Social History  ? ?Socioeconomic History  ? Marital status: Single  ?  Spouse name: Not on file  ? Number of children: Not on file  ? Years of education: Not on file  ? Highest education level: Not on file  ?Occupational History  ? Not on file  ?Tobacco Use  ? Smoking status: Every Day  ?  Packs/day: 1.00  ?  Years: 5.00  ?  Pack years: 5.00  ?  Types: Cigarettes  ? Smokeless tobacco: Never  ?Vaping Use  ? Vaping Use: Never used  ?Substance and Sexual Activity  ? Alcohol use: Not Currently  ?  Alcohol/week: 6.0 standard drinks  ?  Types: 6 Shots of liquor per week  ?  Comment: history of heavy alcohol use; trying to cut back 09/28/20; no etoh 09/10/20. no etoh 03/23/21.  ? Drug use: Not Currently  ?  Types: Marijuana, Oxycodone, Methamphetamines  ?  Comment: heroin; denied 11/24/20, denied 03/23/21  ? Sexual activity: Yes  ?  Birth control/protection: Condom  ?Other Topics Concern  ? Not on file  ?Social History Narrative  ? Not on file  ? ?Social Determinants of Health  ? ?Financial Resource Strain: Not on file  ?Food Insecurity: Not on file  ?Transportation Needs: Not on file  ?Physical Activity: Not on file  ?Stress: Not on file  ?Social Connections: Not on file  ? ? ?Hospital Course:  Patient is a 26 year old male with past psychiatric history of chronic PTSD, MDD, unspecified episodic mood disorder, and medical history of hepatitis C, liver mass initially presented to [facility] as a walk-in with SI with a plan to overdose  and was admitted to [facility].  Patient was transferred to [facility] adult inpatient unit on 03/05/22. ?HOSPITAL COURSE: ?  ?During the patient's hospitalization, patient had extensive initial psychiatric evaluation, and follow-up psychiatric evaluations every day. ?  ?Psychiatric diagnoses provided at discharge:  ?Unspecified mood d/o - r/o Bipolar d/o vs MDD vs SIMD ?PTSD ?GAD ?Opioid use disorder ?Alcohol use r/o alcohol use disorder ?Polysubstance abuse ?Cannabis use  ?Methamphetamine use  ?  ?Patient's psychiatric medications were adjusted on admission:  ?-Restarted lithium 600 mg twice daily for mood stabilization ?-Restarted and Continued Neurontin 300 mg 3 times daily for anxiety ?-Started Zyprexa 5 mg nightly for bipolar depression ?-Started CIWA with Ativan as needed for CIWA greater than 10 with  thiamine 100 mg daily, multivitamin with minerals daily and Prn Zofran ad Imodium for withdrawal symptoms. ?-Started and Continued clonidine taper for opoid withdrawal ?- Started as needed Bentyl, Imodium, Robaxin, Zofran, naproxen for Opoid withdrawal symptoms. ?  ?During the hospitalization, other adjustments were made to the patient's psychiatric medication regimen: ?-Continued lithium 600 mg twice daily for mood stabilization (TSH wnl, repeat Li level low at 0.45, and creatinine wnl ) ?-Continued Neurontin 300 mg 3 times daily for anxiety ?-Continued Zyprexa 5 mg nightly for mood stabilization ?Pt took clonidine for first 2 days and then refuse as he was not having withdrawal symptoms. ?Patient's care was discussed during the interdisciplinary team meeting every day during the hospitalization. ?  ?The patient denies having side effects to prescribed psychiatric medication. ?  ?Gradually, patient started adjusting to milieu. The patient was evaluated each day by a clinical provider to ascertain response to treatment. Improvement was noted by the patient's report of decreasing symptoms, improved sleep and appetite, affect,  medication tolerance, behavior, and participation in unit programming.  Patient was asked each day to complete a self inventory noting mood, mental status, pain, new symptoms, anxiety and concerns.   ?Symptoms were reported as significantly decreased or resolved completely by discharge.  ?The patient reports that their mood is stable.  ?The patient denied having suicidal thoughts for more than 48 hours prior to discharge.  Patient denies having homicidal thoughts.  Patient denies having auditory hallucinations.  Patient denies any visual hallucinations or other symptoms of psychosis.  ?The patient was motivated to continue taking medication with a goal of continued improvement in mental health.  ?  ?The patient reports their target psychiatric symptoms of Unspecified mood disorder and opoid and alcohol withdrawal responded well to the psychiatric medications, and the patient reports overall benefit other psychiatric hospitalization. Supportive psychotherapy was provided to the patient. The patient also participated in regular group therapy while hospitalized. Coping skills, problem solving as well as relaxation therapies were also part of the unit programming. ?  ?Labs were reviewed with the patient, and abnormal results were discussed with the patient. ?  ?  The patient is able to verbalize their individual safety plan to this provider. ?  ?# It is recommended to the patient to continue psychiatric medications as prescribed, after discharge from the hospital.   ?  ?# It is recommended to the patient to follow up with your outpatient psychiatric provider and PCP. ?  ?# It was discussed with the patient, the impact of alcohol, drugs, tobacco have been there overall psychiatric and medical wellbeing, and total abstinence from substance use was recommended the patient. ?  ?#The risks of potentially developing TD/EPS, weight gain, blood dyscrasias, EKG changes, DM, and high cholesterol while taking an antipsychotic were  discussed in detail with the patient. The patient was made aware of the need for ongoing lab, AIMS, EKG, and weight monitoring with use of an antipsychotic.   ?  ?#Recommended the need to monitor lithium leve

## 2022-03-09 NOTE — Progress Notes (Signed)
?   03/09/22 0830  ?Psych Admission Type (Psych Patients Only)  ?Admission Status Voluntary  ?Psychosocial Assessment  ?Patient Complaints Agitation;Anxiety  ?Eye Contact Fair  ?Facial Expression Anxious  ?Affect Anxious  ?Speech Logical/coherent  ?Interaction Assertive;Arrogant  ?Motor Activity Fidgety  ?Appearance/Hygiene Improved  ?Behavior Characteristics Cooperative  ?Mood Pleasant  ?Aggressive Behavior  ?Targets Self  ?Type of Behavior Verbal  ?Effect No apparent injury  ?Thought Process  ?Coherency WDL  ?Content WDL  ?Delusions None reported or observed  ?Perception WDL  ?Hallucination None reported or observed  ?Judgment Poor  ?Confusion None  ?Danger to Self  ?Current suicidal ideation? Denies  ?Description of Suicide Plan Denies active plan  ?Self-Injurious Behavior Some self-injurious ideation observed or expressed.  No lethal plan expressed   ?Agreement Not to Harm Self Yes  ?Description of Agreement verbal  ?Danger to Others  ?Danger to Others None reported or observed  ? ? ?

## 2022-03-09 NOTE — [facility] Suicide Risk Assessment (Signed)
Suicide Risk Assessment ? ?Discharge Assessment    ?[facility] Discharge Suicide Risk Assessment ? ? ?Principal Problem: Unspecified mood (affective) disorder (HCC) ?Discharge Diagnoses: Principal Problem: ?  Unspecified mood (affective) disorder (HCC) ?Active Problems: ?  Suicidal ideation ?  Polysubstance abuse (HCC) ?  Generalized anxiety disorder ?  Chronic post-traumatic stress disorder (PTSD) ? ? ?Total Time spent with patient: 30 minutes ?Patient is a 26 year old male with past psychiatric history of chronic PTSD, MDD, unspecified episodic mood disorder, and medical history of hepatitis C, liver mass initially presented to [facility] as a walk-in with SI with a plan to overdose and was admitted to [facility].  Patient was transferred to [facility] adult inpatient unit on 03/05/22. ?HOSPITAL COURSE: ? ?During the patient's hospitalization, patient had extensive initial psychiatric evaluation, and follow-up psychiatric evaluations every day. ? ?Psychiatric diagnoses provided at discharge:  ?Unspecified mood d/o - r/o Bipolar d/o vs MDD vs SIMD ?PTSD ?GAD ?Opioid use disorder ?Alcohol use r/o alcohol use disorder ?Polysubstance abuse ?Cannabis use  ?Methamphetamine use  ? ?Patient's psychiatric medications were adjusted on admission:  ?-Restarted lithium 600 mg twice daily for mood stabilization ?-Restarted and Continued Neurontin 300 mg 3 times daily for anxiety ?-Started Zyprexa 5 mg nightly for bipolar depression ?-Started CIWA with Ativan as needed for CIWA greater than 10 with  thiamine 100 mg daily, multivitamin with minerals daily and Prn Zofran ad Imodium for withdrawal symptoms. ?-Started and Continued clonidine taper for opoid withdrawal ?- Started as needed Bentyl, Imodium, Robaxin, Zofran, naproxen for Opoid withdrawal symptoms. ? ?During the hospitalization, other adjustments were made to the patient's psychiatric medication regimen: ?-Continued lithium 600 mg twice daily for mood stabilization (TSH wnl, repeat Li level low  at 0.45, and creatinine wnl ) ?-Continued Neurontin 300 mg 3 times daily for anxiety ?-Continued Zyprexa 5 mg nightly for mood stabilization ?Pt took clonidine for first 2 days and then refuse as he was not having withdrawal symptoms. ?Patient's care was discussed during the interdisciplinary team meeting every day during the hospitalization. ? ?The patient denies having side effects to prescribed psychiatric medication. ? ?Gradually, patient started adjusting to milieu. The patient was evaluated each day by a clinical provider to ascertain response to treatment. Improvement was noted by the patient's report of decreasing symptoms, improved sleep and appetite, affect, medication tolerance, behavior, and participation in unit programming.  Patient was asked each day to complete a self inventory noting mood, mental status, pain, new symptoms, anxiety and concerns.   ?Symptoms were reported as significantly decreased or resolved completely by discharge.  ?The patient reports that their mood is stable.  ?The patient denied having suicidal thoughts for more than 48 hours prior to discharge.  Patient denies having homicidal thoughts.  Patient denies having auditory hallucinations.  Patient denies any visual hallucinations or other symptoms of psychosis.  ?The patient was motivated to continue taking medication with a goal of continued improvement in mental health.  ? ?The patient reports their target psychiatric symptoms of Unspecified mood disorder and opoid and alcohol withdrawal responded well to the psychiatric medications, and the patient reports overall benefit other psychiatric hospitalization. Supportive psychotherapy was provided to the patient. The patient also participated in regular group therapy while hospitalized. Coping skills, problem solving as well as relaxation therapies were also part of the unit programming. ? ?Labs were reviewed with the patient, and abnormal results were discussed with the  patient. ? ?The patient is able to verbalize their individual safety plan to this  provider. ? ?# It is recommended to the patient to continue psychiatric medications as prescribed, after discharge from the hospital.   ? ?# It is recommended to the patient to follow up with your outpatient psychiatric provider and PCP. ? ?# It was discussed with the patient, the impact of alcohol, drugs, tobacco have been there overall psychiatric and medical wellbeing, and total abstinence from substance use was recommended the patient. ? ?#The risks of potentially developing TD/EPS, weight gain, blood dyscrasias, EKG changes, DM, and high cholesterol while taking an antipsychotic were discussed in detail with the patient. The patient was made aware of the need for ongoing lab, AIMS, EKG, and weight monitoring with use of an antipsychotic.   ? ?#Recommended the need to monitor lithium levels, renal function and TSH while on lithium.  Educated patient about lithium toxicity and lithium side effects. ? ?# Prescriptions provided or sent directly to preferred pharmacy at discharge. Patient agreeable to plan. Given opportunity to ask questions. Appears to feel comfortable with discharge.  ?  ?# In the event of worsening symptoms, the patient is instructed to call the crisis hotline, 911 and or go to the nearest ED for appropriate evaluation and treatment of symptoms. To follow-up with primary care provider for other medical issues, concerns and or health care needs. ? ?#Patient seen by this MD. At time of discharge, consistently refuted any suicidal ideation, intention or plan, denies any Self harm urges. Denies any A/VH and no delusions were elicited and does not seem to be responding to internal stimuli. During assessment the patient is able to verbalize appropriated coping skills and safety plan to use on return home. Patient verbalizes intent to be compliant with medication and outpatient services.  ? ?# [facility] to  [facility] with a plan to follow up as noted below.  ?Musculoskeletal: ?Strength & Muscle Tone: within normal limits ?Gait & Station: normal ?Patient leans: N/A ? ?Psychiatric Specialty Exam ? ?Presentation  ?General Appearance: Casual ? ?Eye Contact:Fair ? ?Speech:Clear and Coherent ? ?Speech Volume:Normal ? ?Handedness:Right ? ? ?Mood and Affect  ?Mood:Euthymic ? ?Duration of Depression Symptoms: Greater than two weeks ? ?Affect:Congruent ? ? ?Thought Process  ?Thought Processes:Coherent ? ?Descriptions of Associations:Intact ? ?Orientation:Full (Time, Place and Person) ? ?Thought Content:Logical ? ?History of Schizophrenia/Schizoaffective disorder:No ? ?Duration of Psychotic Symptoms:No data recorded ?Hallucinations:Hallucinations: None ? ?Ideas of Reference:None ? ?Suicidal Thoughts:Suicidal Thoughts: No ? ?Homicidal Thoughts:Homicidal Thoughts: No ? ? ?Sensorium  ?Memory:Immediate Good; Recent Good; Remote Good ? ?Judgment:Fair ? ?Insight:Fair ? ? ?Executive Functions  ?Concentration:Good ? ?Attention Span:Good ? ?Recall:Fair ? ?Fund of Knowledge:Fair ? ?Language:Good ? ? ?Psychomotor Activity  ?Psychomotor Activity:Psychomotor Activity: Normal ? ? ?Assets  ?Assets:Communication Skills; Desire for Improvement; Physical Health; Resilience; Social Support ? ? ?Sleep  ?Sleep:Sleep: Good ?Number of Hours of Sleep: 6.75 ? ? ?Physical Exam: ?Physical Exam see d/c summary ?ROSsee d/c summary ?Blood pressure (!) 122/91, pulse 69, temperature 97.8 ?F (36.6 ?C), resp. rate 17, height 6\' 3"  (1.905 m), weight 90.5 kg, SpO2 100 %. Body mass index is 24.95 kg/m?. ? ?Mental Status Per Nursing Assessment::   ?On Admission:  NA ?Demographic factors:  Male, Unemployed, Low socioeconomic status ?Current Mental Status:  Mood Stable, Denies SI, HI, AVH ?Loss Factors:  Financial problems / change in socioeconomic status ?Historical Factors:  Prior suicide attempts ?Risk Reduction Factors:  Positive social support, Sense of  responsibility to family, Positive coping skills or problem solving skills ?Continued Clinical Symptoms:  ?Dysthymia ?Alcohol/Substance  Abuse/Dependencies ?Previous Psychiatric Diagnoses and Treatments ? ?Cognitive Feat

## 2022-03-09 NOTE — Progress Notes (Signed)
?  [facility] Adult Case Management Discharge Plan : ? ?Will you be returning to the same living situation after discharge:  No. Patient going to [facility] ?At discharge, do you have transportation home?: Yes,  bus ticket or grandmother will pick up ?Do you have the ability to pay for your medications: Yes,  provided discount card for prescriptions ? ?Release of information consent forms completed and in the chart;  Patient's signature needed at discharge. ? ?Patient to Follow up at: ? Follow-up Information   ? ? [facility]. Go to.   ?Specialty: Behavioral Health ?Why: Please go to this provider for therapy and medication management services on walk in days:  Mondays and Wednesdays, arrive at 7:30 am.  Services are provided on a first come, first served basis. ?Contact information: ?[address] ?[place] [address] ?[phone] ? ?  ?  ? ?  ?  ? ?  ? ? ?Next level of care provider has access to [facility] Link:yes ? ?Safety Planning and Suicide Prevention discussed: Yes,  grandmother ? ?  ? ?Has patient been referred to the Quitline?: Patient refused referral ? ?Patient has been referred for addiction treatment: Pt. refused referral ? ?Temitope Griffing E Jadin Kagel, LCSW ?03/09/2022, 9:32 AM ?

## 2022-07-20 ENCOUNTER — Emergency Department (HOSPITAL_COMMUNITY): Payer: Self-pay

## 2022-07-20 ENCOUNTER — Encounter (HOSPITAL_COMMUNITY): Payer: Self-pay

## 2022-07-20 ENCOUNTER — Other Ambulatory Visit: Payer: Self-pay

## 2022-07-20 ENCOUNTER — Emergency Department (HOSPITAL_COMMUNITY)
Admission: EM | Admit: 2022-07-20 | Discharge: 2022-07-20 | Disposition: A | Discharge status: Home / Self Care | Payer: Self-pay | Attending: Emergency Medicine | Admitting: Emergency Medicine

## 2022-07-20 DIAGNOSIS — R091 Pleurisy: Secondary | ICD-10-CM

## 2022-07-20 DIAGNOSIS — R079 Chest pain, unspecified: Secondary | ICD-10-CM | POA: Insufficient documentation

## 2022-07-20 LAB — CBC WITH DIFFERENTIAL/PLATELET
Abs Immature Granulocytes: 0.02 10*3/uL (ref 0.00–0.07)
Basophils Absolute: 0 10*3/uL (ref 0.0–0.1)
Basophils Relative: 0 %
Eosinophils Absolute: 0.2 10*3/uL (ref 0.0–0.5)
Eosinophils Relative: 2 %
HCT: 40.5 % (ref 39.0–52.0)
Hemoglobin: 14.2 g/dL (ref 13.0–17.0)
Immature Granulocytes: 0 %
Lymphocytes Relative: 16 %
Lymphs Abs: 1.5 10*3/uL (ref 0.7–4.0)
MCH: 29.3 pg (ref 26.0–34.0)
MCHC: 35.1 g/dL (ref 30.0–36.0)
MCV: 83.5 fL (ref 80.0–100.0)
Monocytes Absolute: 0.8 10*3/uL (ref 0.1–1.0)
Monocytes Relative: 9 %
Neutro Abs: 6.8 10*3/uL (ref 1.7–7.7)
Neutrophils Relative %: 73 %
Platelets: 233 10*3/uL (ref 150–400)
RBC: 4.85 MIL/uL (ref 4.22–5.81)
RDW: 11.9 % (ref 11.5–15.5)
WBC: 9.3 10*3/uL (ref 4.0–10.5)
nRBC: 0 % (ref 0.0–0.2)

## 2022-07-20 LAB — I-STAT CHEM 8, ED
BUN: 17 mg/dL (ref 6–20)
Calcium, Ion: 1.19 mmol/L (ref 1.15–1.40)
Chloride: 101 mmol/L (ref 98–111)
Creatinine, Ser: 0.9 mg/dL (ref 0.61–1.24)
Glucose, Bld: 106 mg/dL — ABNORMAL HIGH (ref 70–99)
HCT: 41 % (ref 39.0–52.0)
Hemoglobin: 13.9 g/dL (ref 13.0–17.0)
Potassium: 4.2 mmol/L (ref 3.5–5.1)
Sodium: 135 mmol/L (ref 135–145)
TCO2: 22 mmol/L (ref 22–32)

## 2022-07-20 LAB — TROPONIN I (HIGH SENSITIVITY): Troponin I (High Sensitivity): 4 ng/L (ref <18)

## 2022-07-20 MED ORDER — SODIUM CHLORIDE (PF) 0.9 % IJ SOLN
INTRAMUSCULAR | Status: AC
  Filled 2022-07-20: qty 50

## 2022-07-20 MED ORDER — IOHEXOL 350 MG/ML SOLN
100.0000 mL | Freq: Once | INTRAVENOUS | Status: AC | PRN
  Administered 2022-07-20: 100 mL via INTRAVENOUS

## 2022-07-20 NOTE — ED Triage Notes (Signed)
Pt to ED by EMS from home with c/o L sided CP.  Pt is an IV drug user, endorses last drug use at 1900 yesterday. Pt states the pain gets worse when he takes deep breaths. Arrives A+O, VSS, NADN.

## 2022-07-20 NOTE — ED Provider Notes (Signed)
[facility]-EMERGENCY DEPT Provider Note   CSN: [id number] Arrival date & time: 07/20/22  0932     History  Chief Complaint  Patient presents with   Chest Pain    John [name] is a 26 y.o. male.  The history is provided by the patient.  Chest Pain Pain location:  L chest Pain quality: sharp   Pain radiates to:  Does not radiate Pain severity:  Moderate Onset quality:  Sudden Duration:  1 day Timing:  Constant Progression:  Waxing and waning Chronicity:  New Context: breathing and at rest   Relieved by:  Nothing Worsened by:  Nothing Ineffective treatments:  None tried Associated symptoms: no cough, no fever, no nausea, no orthopnea, no palpitations, no syncope, no vomiting and no weakness   Risk factors: male sex   Risk factors: no aortic disease   Patient history of polysubstance abuse with left chest pain with inspiration.       Home Medications Prior to Admission medications   Medication Sig Start Date End Date Taking? Authorizing Provider  gabapentin (NEURONTIN) 300 MG capsule Take 1 capsule (300 mg total) by mouth 3 (three) times daily. 01/31/22   Ardis Hughs, NP  hydrOXYzine (ATARAX) 25 MG tablet Take 1 tablet (25 mg total) by mouth 3 (three) times daily as needed for anxiety. 03/09/22   Karsten Ro, MD  lithium carbonate (LITHOBID) 300 MG CR tablet Take 2 tablets (600 mg total) by mouth 2 (two) times daily. 03/09/22 04/08/22  Karsten Ro, MD  nicotine (NICODERM CQ - DOSED IN MG/24 HOURS) 21 mg/24hr patch Place 1 patch (21 mg total) onto the skin daily. 03/10/22   Karsten Ro, MD  OLANZapine (ZYPREXA) 5 MG tablet Take 1 tablet (5 mg total) by mouth at bedtime. 03/09/22 04/08/22  Karsten Ro, MD  thiamine 100 MG tablet Take 1 tablet (100 mg total) by mouth daily. 03/10/22   Karsten Ro, MD  traZODone (DESYREL) 50 MG tablet Take 1 tablet (50 mg total) by mouth at bedtime. 01/31/22   Ardis Hughs, NP      Allergies     Acetaminophen and Benzonatate    Review of Systems   Review of Systems  Constitutional:  Negative for fever.  HENT:  Negative for facial swelling.   Eyes:  Negative for photophobia.  Respiratory:  Negative for cough, wheezing and stridor.   Cardiovascular:  Positive for chest pain. Negative for palpitations, orthopnea and syncope.  Gastrointestinal:  Negative for nausea and vomiting.  Skin:  Negative for rash.  Neurological:  Negative for weakness.  All other systems reviewed and are negative.   Physical Exam Updated Vital Signs BP 123/77   Pulse 70   Temp 99.3 F (37.4 C) (Oral)   Resp 10   SpO2 94%  Physical Exam Vitals and nursing note reviewed.  Constitutional:      General: He is not in acute distress.    Appearance: He is well-developed. He is not diaphoretic.  HENT:     Head: Normocephalic and atraumatic.     Nose: Nose normal.  Eyes:     Conjunctiva/sclera: Conjunctivae normal.     Pupils: Pupils are equal, round, and reactive to light.  Cardiovascular:     Rate and Rhythm: Normal rate and regular rhythm.     Pulses: Normal pulses.     Heart sounds: Normal heart sounds. No murmur heard.    Comments: No janeway lesions, no osler nodes no splinter hemorrhages  Pulmonary:  Effort: Pulmonary effort is normal.     Breath sounds: Normal breath sounds. No wheezing or rales.  Abdominal:     General: Abdomen is flat. Bowel sounds are normal.     Palpations: Abdomen is soft.     Tenderness: There is no abdominal tenderness. There is no guarding or rebound.  Musculoskeletal:        General: Normal range of motion.     Cervical back: Normal range of motion and neck supple.  Skin:    General: Skin is warm and dry.  Neurological:     Mental Status: He is alert and oriented to person, place, and time.     ED Results / Procedures / Treatments   Labs (all labs ordered are listed, but only abnormal results are displayed) Results for orders placed or performed  during the hospital encounter of 07/20/22  CBC with Differential  Result Value Ref Range   WBC 9.3 4.0 - 10.5 K/uL   RBC 4.85 4.22 - 5.81 MIL/uL   Hemoglobin 14.2 13.0 - 17.0 g/dL   HCT 10.6 26.9 - 48.5 %   MCV 83.5 80.0 - 100.0 fL   MCH 29.3 26.0 - 34.0 pg   MCHC 35.1 30.0 - 36.0 g/dL   RDW 46.2 70.3 - 50.0 %   Platelets 233 150 - 400 K/uL   nRBC 0.0 0.0 - 0.2 %   Neutrophils Relative % 73 %   Neutro Abs 6.8 1.7 - 7.7 K/uL   Lymphocytes Relative 16 %   Lymphs Abs 1.5 0.7 - 4.0 K/uL   Monocytes Relative 9 %   Monocytes Absolute 0.8 0.1 - 1.0 K/uL   Eosinophils Relative 2 %   Eosinophils Absolute 0.2 0.0 - 0.5 K/uL   Basophils Relative 0 %   Basophils Absolute 0.0 0.0 - 0.1 K/uL   Immature Granulocytes 0 %   Abs Immature Granulocytes 0.02 0.00 - 0.07 K/uL  I-stat chem 8, ED (not at [facility] or [facility])  Result Value Ref Range   Sodium 135 135 - 145 mmol/L   Potassium 4.2 3.5 - 5.1 mmol/L   Chloride 101 98 - 111 mmol/L   BUN 17 6 - 20 mg/dL   Creatinine, Ser 9.38 0.61 - 1.24 mg/dL   Glucose, Bld 182 (H) 70 - 99 mg/dL   Calcium, Ion 9.93 7.16 - 1.40 mmol/L   TCO2 22 22 - 32 mmol/L   Hemoglobin 13.9 13.0 - 17.0 g/dL   HCT 96.7 89.3 - 81.0 %  Troponin I (High Sensitivity)  Result Value Ref Range   Troponin I (High Sensitivity) 4 <18 ng/L   CT Angio Chest PE W and/or Wo Contrast  Result Date: 07/20/2022 CLINICAL DATA:  History of IV drug abuse. Patient presents with chest pain and shortness of breath. EXAM: CT ANGIOGRAPHY CHEST WITH CONTRAST TECHNIQUE: Multidetector CT imaging of the chest was performed using the standard protocol during bolus administration of intravenous contrast. Multiplanar CT image reconstructions and MIPs were obtained to evaluate the vascular anatomy. RADIATION DOSE REDUCTION: This exam was performed according to the departmental dose-optimization program which includes automated exposure control, adjustment of the mA and/or kV according to patient size and/or use  of iterative reconstruction technique. CONTRAST:  OMNIPAQUE IOHEXOL 350 MG/ML SOLN COMPARISON:  None Available. FINDINGS: Cardiovascular: Satisfactory opacification of the pulmonary arteries to the segmental level. No evidence of pulmonary embolism. Normal heart size. No pericardial effusion. Mediastinum/Nodes: No enlarged mediastinal, hilar, or axillary lymph nodes. Thyroid gland, trachea, and  esophagus demonstrate no significant findings. Lungs/Pleura: No pleural effusion identified. No airspace consolidation. No suspicious pulmonary nodule or mass. Upper Abdomen: Previously characterized benign hemangioma is again identified within right lobe of liver measuring 2.2 cm, image 134/4. Musculoskeletal: Remote healed mid body of sternum fracture. No acute or suspicious osseous findings. Review of the MIP images confirms the above findings. IMPRESSION: 1. No evidence for acute pulmonary embolism. 2. Remote healed mid body of sternum fracture. 3. Stable benign hemangioma within the right lobe of liver. Electronically Signed   By: Signa Kell M.D.   On: 07/20/2022 06:13   DG Chest 2 View  Result Date: 07/20/2022 CLINICAL DATA:  Left upper chest pain EXAM: CHEST - 2 VIEW COMPARISON:  07/27/2018 FINDINGS: Cardiac and mediastinal contours are within normal limits. No focal pulmonary opacity. No pleural effusion or pneumothorax. No acute osseous abnormality. IMPRESSION: No acute cardiopulmonary process. Electronically Signed   By: Wiliam Ke M.D.   On: 07/20/2022 03:55      EKG EKG Interpretation  Date/Time:  Thursday July 20 2022 03:21:28 EDT Ventricular Rate:  83 PR Interval:  127 QRS Duration: 92 QT Interval:  359 QTC Calculation: 422 R Axis:   83 Text Interpretation: Sinus rhythm Confirmed by Nicanor Alcon, Marveen Donlon ([id number]) on 07/20/2022 3:23:41 AM  Radiology DG Chest 2 View  Result Date: 07/20/2022 CLINICAL DATA:  Left upper chest pain EXAM: CHEST - 2 VIEW COMPARISON:  07/27/2018 FINDINGS:  Cardiac and mediastinal contours are within normal limits. No focal pulmonary opacity. No pleural effusion or pneumothorax. No acute osseous abnormality. IMPRESSION: No acute cardiopulmonary process. Electronically Signed   By: Wiliam Ke M.D.   On: 07/20/2022 03:55    Procedures Procedures    Medications Ordered in ED Medications  sodium chloride (PF) 0.9 % injection (has no administration in time range)  iohexol (OMNIPAQUE) 350 MG/ML injection 100 mL (100 mLs Intravenous Contrast Given 07/20/22 0540)    ED Course/ Medical Decision Making/ A&P                           Medical Decision Making Pleuritic lef pain for 1 days.  No leg pain no travel.  No fevers, no murmur.  No fevers, no cough.    Amount and/or Complexity of Data Reviewed External Data Reviewed: notes.    Details: Previous notes reviewed  Labs: ordered.    Details: All labs reviewed:  troponin negative at 4, normal white count 9.3 and hemoglobin 14.2 and platelet count.  Normal Sodium 135 and potassium 4.2 and creatinine .9 .   Radiology: ordered.    Details: Negative CTA by my read ECG/medicine tests: ordered and independent interpretation performed. Decision-making details documented in ED Course.  Risk Prescription drug management. Risk Details: Based on time course one negative ekg and troponin is sufficient to exclude ACS.  HEART score is 1 low risk for MACE.  Symptoms are highly atypical for cardiac etiology. Patient has ruled out for PE with negative CTA.  I do not believe this is endocarditis.  No murmur.  No arthralgias.  No osler nodes, janeway lesions no splinter hemorrhages no roth spots.  No fevers.  Stable for discharge with close follow up.  Strict return.      Final Clinical Impression(s) / ED Diagnoses Final diagnoses:  None   Return for intractable cough, coughing up blood, fevers > 100.4 unrelieved by medication, shortness of breath, intractable vomiting, chest pain, shortness of breath,  weakness, numbness,  changes in speech, facial asymmetry, abdominal pain, passing out, Inability to tolerate liquids or food, cough, altered mental status or any concerns. No signs of systemic illness or infection. The patient is nontoxic-appearing on exam and vital signs are within normal limits.  I have reviewed the triage vital signs and the nursing notes. Pertinent labs & imaging results that were available during my care of the patient were reviewed by me and considered in my medical decision making (see chart for details). After history, exam, and medical workup I feel the patient has been appropriately medically screened and is safe for discharge home. Pertinent diagnoses were discussed with the patient. Patient was given return precautions.  Rx / DC Orders ED Discharge Orders     None         Jora Galluzzo, MD 07/20/22 [phone]

## 2022-07-27 ENCOUNTER — Ambulatory Visit (HOSPITAL_COMMUNITY)
Admission: EM | Admit: 2022-07-27 | Discharge: 2022-07-28 | Disposition: A | Discharge status: Home / Self Care | Payer: No Payment, Other | Attending: Nurse Practitioner | Admitting: Nurse Practitioner

## 2022-07-27 DIAGNOSIS — F411 Generalized anxiety disorder: Secondary | ICD-10-CM | POA: Diagnosis not present

## 2022-07-27 DIAGNOSIS — F3489 Other specified persistent mood disorders: Secondary | ICD-10-CM

## 2022-07-27 DIAGNOSIS — F319 Bipolar disorder, unspecified: Secondary | ICD-10-CM | POA: Insufficient documentation

## 2022-07-27 DIAGNOSIS — F1120 Opioid dependence, uncomplicated: Secondary | ICD-10-CM | POA: Insufficient documentation

## 2022-07-27 DIAGNOSIS — Z20822 Contact with and (suspected) exposure to covid-19: Secondary | ICD-10-CM | POA: Diagnosis not present

## 2022-07-27 DIAGNOSIS — F1910 Other psychoactive substance abuse, uncomplicated: Secondary | ICD-10-CM

## 2022-07-27 DIAGNOSIS — F1994 Other psychoactive substance use, unspecified with psychoactive substance-induced mood disorder: Secondary | ICD-10-CM

## 2022-07-27 MED ORDER — CLONIDINE HCL 0.1 MG PO TABS: 0.1000 mg | ORAL_TABLET | Freq: Four times a day (QID) | ORAL | Status: DC

## 2022-07-27 MED ORDER — LOPERAMIDE HCL 2 MG PO CAPS: 2.0000 mg | ORAL_CAPSULE | ORAL | Status: DC | PRN

## 2022-07-27 MED ORDER — HYDROXYZINE HCL 25 MG PO TABS: 25.0000 mg | ORAL_TABLET | Freq: Four times a day (QID) | ORAL | Status: DC | PRN

## 2022-07-27 MED ORDER — ONDANSETRON 4 MG PO TBDP: 4.0000 mg | ORAL_TABLET | Freq: Four times a day (QID) | ORAL | Status: DC | PRN

## 2022-07-27 MED ORDER — MAGNESIUM HYDROXIDE 400 MG/5ML PO SUSP: 30.0000 mL | Freq: Every day | ORAL | Status: DC | PRN

## 2022-07-27 MED ORDER — CLONIDINE HCL 0.1 MG PO TABS: 0.1000 mg | ORAL_TABLET | ORAL | Status: DC

## 2022-07-27 MED ORDER — CLONIDINE HCL 0.1 MG PO TABS: 0.1000 mg | ORAL_TABLET | Freq: Every day | ORAL | Status: DC

## 2022-07-27 MED ORDER — ALUM & MAG HYDROXIDE-SIMETH 200-200-20 MG/5ML PO SUSP: 30.0000 mL | ORAL | Status: DC | PRN

## 2022-07-27 MED ORDER — NAPROXEN 500 MG PO TABS: 500.0000 mg | ORAL_TABLET | Freq: Two times a day (BID) | ORAL | Status: DC | PRN

## 2022-07-27 MED ORDER — METHOCARBAMOL 500 MG PO TABS: 500.0000 mg | ORAL_TABLET | Freq: Three times a day (TID) | ORAL | Status: DC | PRN

## 2022-07-27 MED ORDER — DICYCLOMINE HCL 20 MG PO TABS: 20.0000 mg | ORAL_TABLET | Freq: Four times a day (QID) | ORAL | Status: DC | PRN

## 2022-07-27 NOTE — Progress Notes (Signed)
   07/27/22 2205  [facility] Triage Screening (Walk-ins at [facility] only)  How Did You Hear About [place]? Self  What Is the Reason for Your Visit/Call Today? Pt has a diagnosis of mood disorder, PTSD and a history of substance use. He was inpatient at [facility] 04/23-04/27/2023 and says he stopped taking his psychiatric medications two months ago because he resumed using heroin. Pt reports injecting approximately 0.5 grams of heroin daily. He denies alcohol or other substance use. Pt describes his mood as depressed. He reports suicidal ideation with plan to "do anything. Blow my brains out." He denies homicidal ideation. He denies psychotic symptoms.  How Long Has This Been Causing You Problems? > than 6 months  Have You Recently Had Any Thoughts About Hurting Yourself? Yes  How long ago did you have thoughts about hurting yourself? Today  Are You Planning to Commit Suicide/Harm Yourself At This time? Yes  Have you Recently Had Thoughts About Hurting Someone Karolee Ohs? No  Are You Planning To Harm Someone At This Time? No  Have You Used Any Alcohol or Drugs in the Past 24 Hours? Yes  How long ago did you use Drugs or Alcohol? Today  What Did You Use and How Much? Approximately 0.5 mg of heroin  Do you have any current medical co-morbidities that require immediate attention? No  Clinician description of patient physical appearance/behavior: Pt is casually dressed with normal grooming. He is alert and oriented x4. Pt speaks in a clear tone, at moderate volume and fast pace. Motor behavior appears slightly restless. Eye contact is good. Pt's mood is depressed and affect is anxious. Thought process is coherent and relevant. There is no indication Pt is currently responding to internal stimuli or experiencing delusional thought content. He is cooperative.  What Do You Feel Would Help You the Most Today? Alcohol or Drug Use Treatment;Treatment for Depression or other mood problem;Medication(s)  If access to [facility] was  not available, would you have sought care in the Emergency Department? Yes  Determination of Need Urgent (48 hours)  Options For Referral [facility];Inpatient Hospitalization;Chemical Dependency Intensive Outpatient Therapy (CDIOP);Medication Management

## 2022-07-27 NOTE — ED Provider Notes (Signed)
[facility] Continuous Assessment Admission H&P  Date: 07/28/22 Patient Name: John [name] MRN: [id number] Chief Complaint: "I am basically here for detox from fentanyl and SI".  Chief Complaint  Patient presents with   Addiction Problem   Depression   Suicidal      Diagnoses:  Final diagnoses:  Polysubstance abuse ([place])  Fentanyl use disorder, severe, dependence (H[facility])  Opioid use disorder, severe, dependence ([place])    HPI: John [name] is a 26 year old male with past psychiatric history of bipolar disorder, depression, generalized anxiety disorder, polysubstance abuse, PTSD, and opioid use disorder.    Per chart review patient has multiple admissions for substance abuse and SI and was recently admitted inpatient at [facility] from 03/05/22-03/09/22. He was also at the [facility] on 01/28/22-01/31/2022 for polysubstance abuse and went to [facility], and states he was there for 4 days and relapsed on heroin and fentanyl".  Patient reports he is primary seeking substance abuse treatment/detox from fentanyl, so he can get on Suboxone.  Patient reports he relapsed about 6 months ago and have been daily using about half a gram of fentanyl (snorts and injects/IV), and last used today.  Patient then adds that he is not really sure how much he uses every day, as he just helps himself whenever he gets cravings.   Patient endorses suicidal suicidal thoughts off/on.  Patient denies having any plans or intent.  Patient endorses being depressed specifically since his recent relapse.  Patient endorses his depressive symptoms as sadness, anxiety, and suicidal thoughts with no plan/intent.  Patient endorses occasional recreational marijuana use, and denies alcohol use.  Patient denies other illicit drug use.  Patient reports he has been to detox programs thrice in the past about 4 to 5 years ago.  Patient denies he is currently being followed by psychiatric services and reports he last took his psychiatric  medications about 6 months ago.  Patient reports he is employed and works at [employer].  Patient reports he lives with his grandparents.  Patient denies access or means to a gun at home.  Patient reports his sleep and appetite as fair.  Patient denies history of withdrawal seizures.    Patient endorses experiencing withdrawal symptoms in the past such as cold sweats, body aches, feeling really sick, and nausea, but currently denies any at this time.  Support and encouragement and reassurance provided about ongoing stressors.  Patient provided with opportunity for questions.  On evaluation, patient is alert, oriented x 3, and cooperative. Speech is clear, coherent and logical. Pt appears appropriate for the environment. Eye contact is fair. Mood is anxious and depressed, affect is congruent with mood. Thought process is logical and thought content is coherent. Pt endorses passive SI with no plan/intent, denies HI/AVH. There is no indication that the patient is responding to internal stimuli. No delusions elicited during this assessment.     PHQ 2-9:   [facility] from 07/27/2022 in [facility] ED from 07/20/2022 in [facility] DEPT Admission (Discharged) from 03/05/2022 in [facility]  C-SSRS RISK CATEGORY High Risk No Risk High Risk        Total Time spent with patient: 20 minutes  Musculoskeletal  Strength & Muscle Tone: within normal limits Gait & Station: normal Patient leans: N/A  Psychiatric Specialty Exam  Presentation General Appearance: Appropriate for Environment  Eye Contact:Fair  Speech:Clear and Coherent  Speech Volume:Normal  Handedness:Right   Mood  and Affect  Mood:Anxious; Depressed  Affect:Congruent   Thought Process  Thought Processes:Coherent  Descriptions of Associations:Intact  Orientation:Full (Time, Place and Person)  Thought Content:Logical   Diagnosis of Schizophrenia or Schizoaffective disorder in past: No   Hallucinations:Hallucinations: None  Ideas of Reference:None  Suicidal Thoughts:Suicidal Thoughts: Yes, Passive SI Passive Intent and/or Plan: Without Plan; Without Intent  Homicidal Thoughts:Homicidal Thoughts: No   Sensorium  Memory:Immediate Fair; Recent Fair  Judgment:Intact  Insight:Present   Executive Functions  Concentration:Good  Attention Span:Good  [place]  Language:Good   Psychomotor Activity  Psychomotor Activity:Psychomotor Activity: Normal   Assets  Assets:Communication Skills; Desire for Improvement; Financial Resources/Insurance; Housing; Intimacy; Physical Health; Social Support   Sleep  Sleep:Sleep: Fair   Nutritional Assessment (For OBS and FBC admissions only) Has the patient had a weight loss or gain of 10 pounds or more in the last 3 months?: No Has the patient had a decrease in food intake/or appetite?: No Does the patient have dental problems?: No Does the patient have eating habits or behaviors that may be indicators of an eating disorder including binging or inducing vomiting?: No Has the patient recently lost weight without trying?: 0 Has the patient been eating poorly because of a decreased appetite?: 0 Malnutrition Screening Tool Score: 0    Physical Exam Constitutional:      General: He is not in acute distress.    Appearance: He is normal weight. He is not diaphoretic.  HENT:     Head: Normocephalic.     Right Ear: External ear normal.     Left Ear: External ear normal.     Nose: No congestion.  Eyes:     General:        Right eye: No discharge.        Left eye: No discharge.  Cardiovascular:     Rate and Rhythm: Normal rate.  Pulmonary:     Effort: No respiratory distress.  Chest:     Chest wall: No tenderness.  Neurological:     Mental Status: He is alert and oriented to person, place, and time.  Psychiatric:         Attention and Perception: Attention and perception normal.        Mood and Affect: Mood is anxious and depressed.        Speech: Speech normal.        Behavior: Behavior is cooperative.        Thought Content: Thought content is not paranoid or delusional. Thought content includes suicidal ideation. Thought content does not include homicidal ideation. Thought content does not include homicidal or suicidal plan.        Cognition and Memory: Cognition and memory normal.        Judgment: Judgment normal.    Review of Systems  Constitutional:  Negative for chills, diaphoresis and fever.  HENT:  Negative for congestion.   Eyes:  Negative for pain and discharge.  Respiratory:  Negative for cough, shortness of breath and wheezing.   Cardiovascular:  Negative for chest pain and palpitations.  Gastrointestinal:  Negative for diarrhea, nausea and vomiting.  Neurological:  Negative for dizziness, seizures, loss of consciousness, weakness and headaches.  Psychiatric/Behavioral:  Positive for depression, substance abuse and suicidal ideas. Negative for hallucinations. The patient is nervous/anxious.     Blood pressure 109/78, pulse 98, temperature 98.6 F (37 C), temperature source Oral, resp. rate 16, SpO2 99 %. There is no height or weight on file  to calculate BMI.  Past Psychiatric History: See H & P   Is the patient at risk to self? Yes  Has the patient been a risk to self in the past 6 months? Yes .    Has the patient been a risk to self within the distant past? Yes   Is the patient a risk to others? No   Has the patient been a risk to others in the past 6 months? No   Has the patient been a risk to others within the distant past? No   Past Medical History:  Past Medical History:  Diagnosis Date   Acne nodule    on going   Aggressive behavior 04/13/2019   Hepatitis C    OD (overdose of drug), intentional self-harm, initial encounter ([place]) 04/13/2019   Substance abuse ([place])  04/13/2019   Suicide attempt ([place]) 04/13/2019    Past Surgical History:  Procedure Laterality Date   ORIF FINGER / THUMB FRACTURE      Family History:  Family History  Problem Relation Age of Onset   Bipolar disorder Mother    Liver disease Maternal Grandfather        etoh   Colon cancer Neg Hx     Social History:  Social History   Socioeconomic History   Marital status: Single    Spouse name: Not on file   Number of children: Not on file   Years of education: Not on file   Highest education level: Not on file  Occupational History   Not on file  Tobacco Use   Smoking status: Every Day    Packs/day: 1.00    Years: 5.00    Total pack years: 5.00    Types: Cigarettes   Smokeless tobacco: Never  Vaping Use   Vaping Use: Never used  Substance and Sexual Activity   Alcohol use: Not Currently    Alcohol/week: 6.0 standard drinks of alcohol    Types: 6 Shots of liquor per week    Comment: history of heavy alcohol use; trying to cut back 09/28/20; no etoh 09/10/20. no etoh 03/23/21.   Drug use: Not Currently    Types: Marijuana, Oxycodone, Methamphetamines    Comment: heroin; denied 11/24/20, denied 03/23/21   Sexual activity: Yes    Birth control/protection: Condom  Other Topics Concern   Not on file  Social History Narrative   Not on file   Social Determinants of Health   Financial Resource Strain: Not on file  Food Insecurity: Not on file  Transportation Needs: Not on file  Physical Activity: Not on file  Stress: Not on file  Social Connections: Not on file  Intimate Partner Violence: Not on file    SDOH:  SDOH Screenings   Alcohol Screen: Low Risk  (03/05/2022)  Depression (PHQ2-9): Low Risk  (10/14/2020)  Tobacco Use: High Risk (07/20/2022)    Last Labs:  Admission on 07/27/2022  Component Date Value Ref Range Status   POC Amphetamine UR 07/28/2022 None Detected  NONE DETECTED (Cut Off Level 1000 ng/mL) Final   POC Secobarbital (BAR) 07/28/2022 None  Detected  NONE DETECTED (Cut Off Level 300 ng/mL) Final   POC Buprenorphine (BUP) 07/28/2022 None Detected  NONE DETECTED (Cut Off Level 10 ng/mL) Final   POC Oxazepam (BZO) 07/28/2022 None Detected  NONE DETECTED (Cut Off Level 300 ng/mL) Final   POC Cocaine UR 07/28/2022 Positive (A)  NONE DETECTED (Cut Off Level 300 ng/mL) Final   POC Methamphetamine UR 07/28/2022 None  Detected  NONE DETECTED (Cut Off Level 1000 ng/mL) Final   POC Morphine 07/28/2022 Positive (A)  NONE DETECTED (Cut Off Level 300 ng/mL) Final   POC Methadone UR 07/28/2022 None Detected  NONE DETECTED (Cut Off Level 300 ng/mL) Final   POC Oxycodone UR 07/28/2022 None Detected  NONE DETECTED (Cut Off Level 100 ng/mL) Final   POC Marijuana UR 07/28/2022 Positive (A)  NONE DETECTED (Cut Off Level 50 ng/mL) Final   SARSCOV2ONAVIRUS 2 AG 07/28/2022 NEGATIVE  NEGATIVE Final   Comment: (NOTE) SARS-CoV-2 antigen NOT DETECTED.   Negative results are presumptive.  Negative results do not preclude SARS-CoV-2 infection and should not be used as the sole basis for treatment or other patient management decisions, including infection  control decisions, particularly in the presence of clinical signs and  symptoms consistent with COVID-19, or in those who have been in contact with the virus.  Negative results must be combined with clinical observations, patient history, and epidemiological information. The expected result is Negative.  Fact Sheet for Patients: HandmadeRecipes.com.cy  Fact Sheet for Healthcare Providers: FuneralLife.at  This test is not yet approved or cleared by the [place] FDA and  has been authorized for detection and/or diagnosis of SARS-CoV-2 by FDA under an Emergency Use Authorization (EUA).  This EUA will remain in effect (meaning this test can be used) for the duration of  the COV                          ID-19 declaration under Section 564(b)(1) of the  Act, 21 U.S.C. section 360bbb-3(b)(1), unless the authorization is terminated or revoked sooner.    Admission on 07/20/2022, Discharged on 07/20/2022  Component Date Value Ref Range Status   WBC 07/20/2022 9.3  4.0 - 10.5 K/uL Final   RBC 07/20/2022 4.85  4.22 - 5.81 MIL/uL Final   Hemoglobin 07/20/2022 14.2  13.0 - 17.0 g/dL Final   HCT 07/20/2022 40.5  39.0 - 52.0 % Final   MCV 07/20/2022 83.5  80.0 - 100.0 fL Final   MCH 07/20/2022 29.3  26.0 - 34.0 pg Final   MCHC 07/20/2022 35.1  30.0 - 36.0 g/dL Final   RDW 07/20/2022 11.9  11.5 - 15.5 % Final   Platelets 07/20/2022 233  150 - 400 K/uL Final   nRBC 07/20/2022 0.0  0.0 - 0.2 % Final   Neutrophils Relative % 07/20/2022 73  % Final   Neutro Abs 07/20/2022 6.8  1.7 - 7.7 K/uL Final   Lymphocytes Relative 07/20/2022 16  % Final   Lymphs Abs 07/20/2022 1.5  0.7 - 4.0 K/uL Final   Monocytes Relative 07/20/2022 9  % Final   Monocytes Absolute 07/20/2022 0.8  0.1 - 1.0 K/uL Final   Eosinophils Relative 07/20/2022 2  % Final   Eosinophils Absolute 07/20/2022 0.2  0.0 - 0.5 K/uL Final   Basophils Relative 07/20/2022 0  % Final   Basophils Absolute 07/20/2022 0.0  0.0 - 0.1 K/uL Final   Immature Granulocytes 07/20/2022 0  % Final   Abs Immature Granulocytes 07/20/2022 0.02  0.00 - 0.07 K/uL Final   Performed at [facility], [place] [address], [place], [place] [address]   Sodium 07/20/2022 135  135 - 145 mmol/L Final   Potassium 07/20/2022 4.2  3.5 - 5.1 mmol/L Final   Chloride 07/20/2022 101  98 - 111 mmol/L Final   BUN 07/20/2022 17  6 - 20 mg/dL Final  Creatinine, Ser 07/20/2022 0.90  0.61 - 1.24 mg/dL Final   Glucose, Bld 07/20/2022 106 (H)  70 - 99 mg/dL Final   Glucose reference range applies only to samples taken after fasting for at least 8 hours.   Calcium, Ion 07/20/2022 1.19  1.15 - 1.40 mmol/L Final   TCO2 07/20/2022 22  22 - 32 mmol/L Final   Hemoglobin 07/20/2022 13.9  13.0 - 17.0 g/dL Final   HCT  07/20/2022 41.0  39.0 - 52.0 % Final   Troponin I (High Sensitivity) 07/20/2022 4  <18 ng/L Final   Comment: (NOTE) Elevated high sensitivity troponin I (hsTnI) values and significant  changes across serial measurements may suggest ACS but many other  chronic and acute conditions are known to elevate hsTnI results.  Refer to the "Links" section for chest pain algorithms and additional  guidance. Performed at [facility], [place] [address], [place], [place] [address]   Admission on 03/05/2022, Discharged on 03/09/2022  Component Date Value Ref Range Status   Hgb A1c MFr Bld 03/07/2022 5.3  4.8 - 5.6 % Final   Comment: (NOTE) Pre diabetes:          5.7%-6.4%  Diabetes:              >6.4%  Glycemic control for   <7.0% adults with diabetes    Mean Plasma Glucose 03/07/2022 105.41  mg/dL Final   Performed at [facility] [address] [place], [place] [address]   Lithium Lvl 03/09/2022 0.45 (L)  0.60 - 1.20 mmol/L Final   Performed at [facility] [address], [place], [place] [address]   TSH 03/09/2022 3.116  0.350 - 4.500 uIU/mL Final   Comment: Performed by a 3rd Generation assay with a functional sensitivity of <=0.01 uIU/mL. Performed at [facility], [place] [address] [place], [place] [address]    Sodium 03/09/2022 137  135 - 145 mmol/L Final   Potassium 03/09/2022 4.2  3.5 - 5.1 mmol/L Final   Chloride 03/09/2022 107  98 - 111 mmol/L Final   CO2 03/09/2022 27  22 - 32 mmol/L Final   Glucose, Bld 03/09/2022 78  70 - 99 mg/dL Final   Glucose reference range applies only to samples taken after fasting for at least 8 hours.   BUN 03/09/2022 20  6 - 20 mg/dL Final   Creatinine, Ser 03/09/2022 1.15  0.61 - 1.24 mg/dL Final   Calcium 03/09/2022 9.1  8.9 - 10.3 mg/dL Final   GFR, Estimated 03/09/2022 >60  >60 mL/min Final   Comment: (NOTE) Calculated using the CKD-EPI Creatinine Equation (2021)    Anion gap  03/09/2022 3 (L)  5 - 15 Final   Performed at [facility], [place] [address], [place], [place] [address]  Admission on 03/04/2022, Discharged on 03/05/2022  Component Date Value Ref Range Status   SARS Coronavirus 2 by RT PCR 03/04/2022 NEGATIVE  NEGATIVE Final   Comment: (NOTE) SARS-CoV-2 target nucleic acids are NOT DETECTED.  The SARS-CoV-2 RNA is generally detectable in upper respiratory specimens during the acute phase of infection. The lowest concentration of SARS-CoV-2 viral copies this assay can detect is 138 copies/mL. A negative result does not preclude SARS-Cov-2 infection and should not be used as the sole basis for treatment or other patient management decisions. A negative result may occur with  improper specimen collection/handling, submission of specimen other than nasopharyngeal swab, presence of viral mutation(s) within the areas  targeted by this assay, and inadequate number of viral copies(<138 copies/mL). A negative result must be combined with clinical observations, patient history, and epidemiological information. The expected result is Negative.  Fact Sheet for Patients:  BloggerCourse.com  Fact Sheet for Healthcare Providers:  SeriousBroker.it  This test is no                          t yet approved or cleared by the [place] FDA and  has been authorized for detection and/or diagnosis of SARS-CoV-2 by FDA under an Emergency Use Authorization (EUA). This EUA will remain  in effect (meaning this test can be used) for the duration of the COVID-19 declaration under Section 564(b)(1) of the Act, 21 U.S.C.section 360bbb-3(b)(1), unless the authorization is terminated  or revoked sooner.       Influenza A by PCR 03/04/2022 NEGATIVE  NEGATIVE Final   Influenza B by PCR 03/04/2022 NEGATIVE  NEGATIVE Final   Comment: (NOTE) The Xpert Xpress SARS-CoV-2/FLU/RSV plus assay is intended as an  aid in the diagnosis of influenza from Nasopharyngeal swab specimens and should not be used as a sole basis for treatment. Nasal washings and aspirates are unacceptable for Xpert Xpress SARS-CoV-2/FLU/RSV testing.  Fact Sheet for Patients: BloggerCourse.com  Fact Sheet for Healthcare Providers: SeriousBroker.it  This test is not yet approved or cleared by the [place] FDA and has been authorized for detection and/or diagnosis of SARS-CoV-2 by FDA under an Emergency Use Authorization (EUA). This EUA will remain in effect (meaning this test can be used) for the duration of the COVID-19 declaration under Section 564(b)(1) of the Act, 21 U.S.C. section 360bbb-3(b)(1), unless the authorization is terminated or revoked.  Performed at [facility], [address], [place], [place] [address]    WBC 03/04/2022 8.6  4.0 - 10.5 K/uL Final   RBC 03/04/2022 5.29  4.22 - 5.81 MIL/uL Final   Hemoglobin 03/04/2022 15.6  13.0 - 17.0 g/dL Final   HCT 42/68/3419 45.8  39.0 - 52.0 % Final   MCV 03/04/2022 86.6  80.0 - 100.0 fL Final   MCH 03/04/2022 29.5  26.0 - 34.0 pg Final   MCHC 03/04/2022 34.1  30.0 - 36.0 g/dL Final   RDW 62/22/9798 12.4  11.5 - 15.5 % Final   Platelets 03/04/2022 326  150 - 400 K/uL Final   nRBC 03/04/2022 0.0  0.0 - 0.2 % Final   Neutrophils Relative % 03/04/2022 62  % Final   Neutro Abs 03/04/2022 5.2  1.7 - 7.7 K/uL Final   Lymphocytes Relative 03/04/2022 31  % Final   Lymphs Abs 03/04/2022 2.7  0.7 - 4.0 K/uL Final   Monocytes Relative 03/04/2022 6  % Final   Monocytes Absolute 03/04/2022 0.6  0.1 - 1.0 K/uL Final   Eosinophils Relative 03/04/2022 1  % Final   Eosinophils Absolute 03/04/2022 0.1  0.0 - 0.5 K/uL Final   Basophils Relative 03/04/2022 0  % Final   Basophils Absolute 03/04/2022 0.0  0.0 - 0.1 K/uL Final   Immature Granulocytes 03/04/2022 0  % Final   Abs Immature Granulocytes 03/04/2022 0.02   0.00 - 0.07 K/uL Final   Performed at [facility], [address], [place], [place] [address]   Sodium 03/04/2022 139  135 - 145 mmol/L Final   Potassium 03/04/2022 4.8  3.5 - 5.1 mmol/L Final   Chloride 03/04/2022 104  98 - 111 mmol/L Final  CO2 03/04/2022 26  22 - 32 mmol/L Final   Glucose, Bld 03/04/2022 72  70 - 99 mg/dL Final   Glucose reference range applies only to samples taken after fasting for at least 8 hours.   BUN 03/04/2022 10  6 - 20 mg/dL Final   Creatinine, Ser 03/04/2022 1.01  0.61 - 1.24 mg/dL Final   Calcium 03/04/2022 9.8  8.9 - 10.3 mg/dL Final   Total Protein 03/04/2022 6.9  6.5 - 8.1 g/dL Final   Albumin 03/04/2022 4.4  3.5 - 5.0 g/dL Final   AST 03/04/2022 23  15 - 41 U/L Final   ALT 03/04/2022 24  0 - 44 U/L Final   Alkaline Phosphatase 03/04/2022 73  38 - 126 U/L Final   Total Bilirubin 03/04/2022 0.2 (L)  0.3 - 1.2 mg/dL Final   GFR, Estimated 03/04/2022 >60  >60 mL/min Final   Comment: (NOTE) Calculated using the CKD-EPI Creatinine Equation (2021)    Anion gap 03/04/2022 9  5 - 15 Final   Performed at [facility] [address], [place], [place] [address]   Cholesterol 03/04/2022 143  0 - 200 mg/dL Final   Triglycerides 03/04/2022 67  <150 mg/dL Final   HDL 03/04/2022 59  >40 mg/dL Final   Total CHOL/HDL Ratio 03/04/2022 2.4  RATIO Final   VLDL 03/04/2022 13  0 - 40 mg/dL Final   LDL Cholesterol 03/04/2022 71  0 - 99 mg/dL Final   Comment:        Total Cholesterol/HDL:CHD Risk Coronary Heart Disease Risk Table                     Men   Women  1/2 Average Risk   3.4   3.3  Average Risk       5.0   4.4  2 X Average Risk   9.6   7.1  3 X Average Risk  23.4   11.0        Use the calculated Patient Ratio above and the CHD Risk Table to determine the patient's CHD Risk.        ATP III CLASSIFICATION (LDL):  <100     mg/dL   Optimal  100-129  mg/dL   Near or Above                    Optimal  130-159  mg/dL   Borderline  160-189   mg/dL   High  >190     mg/dL   Very High Performed at [facility] [address], [place], [place] [address]    POC Amphetamine UR 03/04/2022 Positive (A)  NONE DETECTED (Cut Off Level 1000 ng/mL) Final   POC Secobarbital (BAR) 03/04/2022 None Detected  NONE DETECTED (Cut Off Level 300 ng/mL) Final   POC Buprenorphine (BUP) 03/04/2022 None Detected  NONE DETECTED (Cut Off Level 10 ng/mL) Final   POC Oxazepam (BZO) 03/04/2022 Positive (A)  NONE DETECTED (Cut Off Level 300 ng/mL) Final   POC Cocaine UR 03/04/2022 None Detected  NONE DETECTED (Cut Off Level 300 ng/mL) Final   POC Methamphetamine UR 03/04/2022 Positive (A)  NONE DETECTED (Cut Off Level 1000 ng/mL) Final   POC Morphine 03/04/2022 None Detected  NONE DETECTED (Cut Off Level 300 ng/mL) Final   POC Oxycodone UR 03/04/2022 None Detected  NONE DETECTED (Cut Off Level 100 ng/mL) Final   POC Methadone UR 03/04/2022 None Detected  NONE DETECTED (Cut Off  Level 300 ng/mL) Final   POC Marijuana UR 03/04/2022 Positive (A)  NONE DETECTED (Cut Off Level 50 ng/mL) Final   SARSCOV2ONAVIRUS 2 AG 03/04/2022 NEGATIVE  NEGATIVE Final   Comment: (NOTE) SARS-CoV-2 antigen NOT DETECTED.   Negative results are presumptive.  Negative results do not preclude SARS-CoV-2 infection and should not be used as the sole basis for treatment or other patient management decisions, including infection  control decisions, particularly in the presence of clinical signs and  symptoms consistent with COVID-19, or in those who have been in contact with the virus.  Negative results must be combined with clinical observations, patient history, and epidemiological information. The expected result is Negative.  Fact Sheet for Patients: https://www.jennings-kim.com/  Fact Sheet for Healthcare Providers: https://alexander-rogers.biz/  This test is not yet approved or cleared by the [place] FDA and  has been authorized for detection  and/or diagnosis of SARS-CoV-2 by FDA under an Emergency Use Authorization (EUA).  This EUA will remain in effect (meaning this test can be used) for the duration of  the COV                          ID-19 declaration under Section 564(b)(1) of the Act, 21 U.S.C. section 360bbb-3(b)(1), unless the authorization is terminated or revoked sooner.     Lithium Lvl 03/05/2022 0.30 (L)  0.60 - 1.20 mmol/L Final   Performed at [facility], [address], [place], [place] [address]  Admission on 01/28/2022, Discharged on 02/01/2022  Component Date Value Ref Range Status   Lithium Lvl 01/29/2022 0.78  0.60 - 1.20 mmol/L Final   Performed at [facility], [address] [place], [place] [address]  Admission on 01/24/2022, Discharged on 01/28/2022  Component Date Value Ref Range Status   SARS Coronavirus 2 by RT PCR 01/24/2022 NEGATIVE  NEGATIVE Final   Comment: (NOTE) SARS-CoV-2 target nucleic acids are NOT DETECTED.  The SARS-CoV-2 RNA is generally detectable in upper respiratory specimens during the acute phase of infection. The lowest concentration of SARS-CoV-2 viral copies this assay can detect is 138 copies/mL. A negative result does not preclude SARS-Cov-2 infection and should not be used as the sole basis for treatment or other patient management decisions. A negative result may occur with  improper specimen collection/handling, submission of specimen other than nasopharyngeal swab, presence of viral mutation(s) within the areas targeted by this assay, and inadequate number of viral copies(<138 copies/mL). A negative result must be combined with clinical observations, patient history, and epidemiological information. The expected result is Negative.  Fact Sheet for Patients:  BloggerCourse.com  Fact Sheet for Healthcare Providers:  SeriousBroker.it  This test is no                          t yet approved or cleared  by the [place] FDA and  has been authorized for detection and/or diagnosis of SARS-CoV-2 by FDA under an Emergency Use Authorization (EUA). This EUA will remain  in effect (meaning this test can be used) for the duration of the COVID-19 declaration under Section 564(b)(1) of the Act, 21 U.S.C.section 360bbb-3(b)(1), unless the authorization is terminated  or revoked sooner.       Influenza A by PCR 01/24/2022 NEGATIVE  NEGATIVE Final   Influenza B by PCR 01/24/2022 NEGATIVE  NEGATIVE Final   Comment: (NOTE) The Xpert Xpress SARS-CoV-2/FLU/RSV plus assay is intended as an  aid in the diagnosis of influenza from Nasopharyngeal swab specimens and should not be used as a sole basis for treatment. Nasal washings and aspirates are unacceptable for Xpert Xpress SARS-CoV-2/FLU/RSV testing.  Fact Sheet for Patients: BloggerCourse.com  Fact Sheet for Healthcare Providers: SeriousBroker.it  This test is not yet approved or cleared by the [place] FDA and has been authorized for detection and/or diagnosis of SARS-CoV-2 by FDA under an Emergency Use Authorization (EUA). This EUA will remain in effect (meaning this test can be used) for the duration of the COVID-19 declaration under Section 564(b)(1) of the Act, 21 U.S.C. section 360bbb-3(b)(1), unless the authorization is terminated or revoked.  Performed at [facility], [address], [place], [place] [address]    Sodium 01/24/2022 136  135 - 145 mmol/L Final   Potassium 01/24/2022 4.4  3.5 - 5.1 mmol/L Final   Chloride 01/24/2022 103  98 - 111 mmol/L Final   CO2 01/24/2022 21 (L)  22 - 32 mmol/L Final   Glucose, Bld 01/24/2022 92  70 - 99 mg/dL Final   Glucose reference range applies only to samples taken after fasting for at least 8 hours.   BUN 01/24/2022 17  6 - 20 mg/dL Final   Creatinine, Ser 01/24/2022 1.21  0.61 - 1.24 mg/dL Final   Calcium 19/62/2297 9.1  8.9  - 10.3 mg/dL Final   Total Protein 98/92/1194 7.0  6.5 - 8.1 g/dL Final   Albumin 17/40/8144 4.1  3.5 - 5.0 g/dL Final   AST 81/85/6314 25  15 - 41 U/L Final   ALT 01/24/2022 24  0 - 44 U/L Final   Alkaline Phosphatase 01/24/2022 60  38 - 126 U/L Final   Total Bilirubin 01/24/2022 0.7  0.3 - 1.2 mg/dL Final   GFR, Estimated 01/24/2022 >60  >60 mL/min Final   Comment: (NOTE) Calculated using the CKD-EPI Creatinine Equation (2021)    Anion gap 01/24/2022 12  5 - 15 Final   Performed at [facility], [address] [place], [place] [address]   Alcohol, Ethyl (B) 01/24/2022 <10  <10 mg/dL Final   Comment: (NOTE) Lowest detectable limit for serum alcohol is 10 mg/dL.  For medical purposes only. Performed at [facility], [address] [place], [place] [address]    Opiates 01/24/2022 POSITIVE (A)  NONE DETECTED Final   Cocaine 01/24/2022 POSITIVE (A)  NONE DETECTED Final   Benzodiazepines 01/24/2022 POSITIVE (A)  NONE DETECTED Final   Amphetamines 01/24/2022 POSITIVE (A)  NONE DETECTED Final   Tetrahydrocannabinol 01/24/2022 POSITIVE (A)  NONE DETECTED Final   Barbiturates 01/24/2022 NONE DETECTED  NONE DETECTED Final   Comment: (NOTE) DRUG SCREEN FOR MEDICAL PURPOSES ONLY.  IF CONFIRMATION IS NEEDED FOR ANY PURPOSE, NOTIFY LAB WITHIN 5 DAYS.  LOWEST DETECTABLE LIMITS FOR URINE DRUG SCREEN Drug Class                     Cutoff (ng/mL) Amphetamine and metabolites    1000 Barbiturate and metabolites    200 Benzodiazepine                 200 Tricyclics and metabolites     300 Opiates and metabolites        300 Cocaine and metabolites        300 THC  50 Performed at [facility], [place] [address] [place], [place] [address]    WBC 01/24/2022 16.0 (H)  4.0 - 10.5 K/uL Final   RBC 01/24/2022 4.81  4.22 - 5.81 MIL/uL Final   Hemoglobin 01/24/2022 14.7  13.0 - 17.0 g/dL Final   HCT 01/24/2022 41.3  39.0 - 52.0 % Final   MCV 01/24/2022  85.9  80.0 - 100.0 fL Final   MCH 01/24/2022 30.6  26.0 - 34.0 pg Final   MCHC 01/24/2022 35.6  30.0 - 36.0 g/dL Final   RDW 01/24/2022 12.0  11.5 - 15.5 % Final   Platelets 01/24/2022 263  150 - 400 K/uL Final   nRBC 01/24/2022 0.0  0.0 - 0.2 % Final   Neutrophils Relative % 01/24/2022 81  % Final   Neutro Abs 01/24/2022 12.9 (H)  1.7 - 7.7 K/uL Final   Lymphocytes Relative 01/24/2022 12  % Final   Lymphs Abs 01/24/2022 1.9  0.7 - 4.0 K/uL Final   Monocytes Relative 01/24/2022 7  % Final   Monocytes Absolute 01/24/2022 1.1 (H)  0.1 - 1.0 K/uL Final   Eosinophils Relative 01/24/2022 0  % Final   Eosinophils Absolute 01/24/2022 0.1  0.0 - 0.5 K/uL Final   Basophils Relative 01/24/2022 0  % Final   Basophils Absolute 01/24/2022 0.0  0.0 - 0.1 K/uL Final   Immature Granulocytes 01/24/2022 0  % Final   Abs Immature Granulocytes 01/24/2022 0.06  0.00 - 0.07 K/uL Final   Performed at [facility], [place] [address] [place], [place] [id number]   Salicylate Lvl 123456 <7.0 (L)  7.0 - 30.0 mg/dL Final   Performed at [place] [address], [place], [place] [address]   Acetaminophen (Tylenol), Serum 01/24/2022 <10 (L)  10 - 30 ug/mL Final   Comment: (NOTE) Therapeutic concentrations vary significantly. A range of 10-30 ug/mL  may be an effective concentration for many patients. However, some  are best treated at concentrations outside of this range. Acetaminophen concentrations >150 ug/mL at 4 hours after ingestion  and >50 ug/mL at 12 hours after ingestion are often associated with  toxic reactions.  Performed at [facility], [place] [address], [place], [place] [address]    SARS Coronavirus 2 by RT PCR 01/28/2022 NEGATIVE  NEGATIVE Final   Comment: (NOTE) SARS-CoV-2 target nucleic acids are NOT DETECTED.  The SARS-CoV-2 RNA is generally detectable in upper respiratory specimens during the acute phase of infection. The lowest concentration of SARS-CoV-2 viral  copies this assay can detect is 138 copies/mL. A negative result does not preclude SARS-Cov-2 infection and should not be used as the sole basis for treatment or other patient management decisions. A negative result may occur with  improper specimen collection/handling, submission of specimen other than nasopharyngeal swab, presence of viral mutation(s) within the areas targeted by this assay, and inadequate number of viral copies(<138 copies/mL). A negative result must be combined with clinical observations, patient history, and epidemiological information. The expected result is Negative.  Fact Sheet for Patients:  EntrepreneurPulse.com.au  Fact Sheet for Healthcare Providers:  IncredibleEmployment.be  This test is no                          t yet approved or cleared by the [place] FDA and  has been authorized for detection and/or diagnosis of SARS-CoV-2 by FDA under an Emergency Use Authorization (EUA). This EUA will remain  in effect (meaning this test can be used) for the duration of the COVID-19 declaration under Section 564(b)(1) of the Act, 21 U.S.C.section 360bbb-3(b)(1), unless the authorization is terminated  or revoked sooner.       Influenza A by PCR 01/28/2022 NEGATIVE  NEGATIVE Final   Influenza B by PCR 01/28/2022 NEGATIVE  NEGATIVE Final   Comment: (NOTE) The Xpert Xpress SARS-CoV-2/FLU/RSV plus assay is intended as an aid in the diagnosis of influenza from Nasopharyngeal swab specimens and should not be used as a sole basis for treatment. Nasal washings and aspirates are unacceptable for Xpert Xpress SARS-CoV-2/FLU/RSV testing.  Fact Sheet for Patients: EntrepreneurPulse.com.au  Fact Sheet for Healthcare Providers: IncredibleEmployment.be  This test is not yet approved or cleared by the [place] FDA and has been authorized for detection and/or diagnosis of SARS-CoV-2 by FDA  under an Emergency Use Authorization (EUA). This EUA will remain in effect (meaning this test can be used) for the duration of the COVID-19 declaration under Section 564(b)(1) of the Act, 21 U.S.C. section 360bbb-3(b)(1), unless the authorization is terminated or revoked.  Performed at [facility], [place] [address], [place], [place] [address]     Allergies: Acetaminophen and Benzonatate  PTA Medications: (Not in a hospital admission)  Prior to Admission medications   Medication Sig Start Date End Date Taking? Authorizing Provider  gabapentin (NEURONTIN) 300 MG capsule Take 1 capsule (300 mg total) by mouth 3 (three) times daily. 01/31/22   Revonda Humphrey, NP  hydrOXYzine (ATARAX) 25 MG tablet Take 1 tablet (25 mg total) by mouth 3 (three) times daily as needed for anxiety. 03/09/22   Armando Reichert, MD  lithium carbonate (LITHOBID) 300 MG CR tablet Take 2 tablets (600 mg total) by mouth 2 (two) times daily. 03/09/22 04/08/22  Armando Reichert, MD  nicotine (NICODERM CQ - DOSED IN MG/24 HOURS) 21 mg/24hr patch Place 1 patch (21 mg total) onto the skin daily. 03/10/22   Armando Reichert, MD  OLANZapine (ZYPREXA) 5 MG tablet Take 1 tablet (5 mg total) by mouth at bedtime. 03/09/22 04/08/22  Armando Reichert, MD  thiamine 100 MG tablet Take 1 tablet (100 mg total) by mouth daily. 03/10/22   Armando Reichert, MD  traZODone (DESYREL) 50 MG tablet Take 1 tablet (50 mg total) by mouth at bedtime. 01/31/22   Revonda Humphrey, NP    Medical Decision Making  Recommend admit to the continuous observation unit.  Patient qualifies for the [facility] for substance abuse treatment/detox and mood stabilization.  However patient presented to the [facility] together with his girlfriend who is currently [facility] to the [facility] for substance abuse treatment/detox.  Lab Orders         Resp Panel by RT-PCR (Flu A&B, Covid) Anterior Nasal Swab         CBC with Differential/Platelet         Comprehensive metabolic panel          Lipid panel         TSH         Hepatitis panel, acute         POCT Urine Drug Screen - (I-Screen)         POC SARS Coronavirus 2 Ag      EKG  Recommend COWS protocol  Initiate prn clonidine withdrawal protocol -hydroxyzine 25 mg PO every 6 hours prn for anxiety -ondansetron 4 mg ODT every 6 hours prn nausea/vomiting -naproxen 500 mg BID prn for pain -dicyclomine 20 mg  PO every 6 hours prn abdominal cramping -loperamide 2-4 mg capsule prn diarrhea -methocarbamol 500 mg PO every 8 hours prn spasms   Other PRNs -Maalox 30 mL p.o. every 4 hours as needed indigestion -MOM 30 mL p.o. daily as needed constipation    Recommendations  Based on my evaluation the patient does not appear to have an emergency medical condition.  Patient admitted to the continuous observation unit for substance abuse treatment and mood stabilization.  Patient may be appropriate for the [facility], however the patient's girlfriend is currently admitted to the [facility] for same.  Both parties are separated due to concerns for co-dependency.   Recommend COWS protocol.   Randon Goldsmith, NP 07/28/22  2:15 AM
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
## 2022-07-28 ENCOUNTER — Other Ambulatory Visit: Payer: Self-pay

## 2022-07-28 LAB — HEPATITIS PANEL, ACUTE
HCV Ab: REACTIVE — AB
Hep A IgM: NONREACTIVE
Hep B C IgM: NONREACTIVE
Hepatitis B Surface Ag: NONREACTIVE

## 2022-07-28 LAB — LIPID PANEL
Cholesterol: 140 mg/dL (ref 0–200)
HDL: 38 mg/dL — ABNORMAL LOW (ref >40)
LDL Cholesterol: 94 mg/dL (ref 0–99)
Total CHOL/HDL Ratio: 3.7 RATIO
Triglycerides: 38 mg/dL (ref <150)
VLDL: 8 mg/dL (ref 0–40)

## 2022-07-28 LAB — CBC WITH DIFFERENTIAL/PLATELET
Abs Immature Granulocytes: 0.02 10*3/uL (ref 0.00–0.07)
Basophils Absolute: 0 10*3/uL (ref 0.0–0.1)
Basophils Relative: 1 %
Eosinophils Absolute: 0.1 10*3/uL (ref 0.0–0.5)
Eosinophils Relative: 1 %
HCT: 37.7 % — ABNORMAL LOW (ref 39.0–52.0)
Hemoglobin: 13.2 g/dL (ref 13.0–17.0)
Immature Granulocytes: 0 %
Lymphocytes Relative: 28 %
Lymphs Abs: 2.5 10*3/uL (ref 0.7–4.0)
MCH: 29.1 pg (ref 26.0–34.0)
MCHC: 35 g/dL (ref 30.0–36.0)
MCV: 83 fL (ref 80.0–100.0)
Monocytes Absolute: 0.9 10*3/uL (ref 0.1–1.0)
Monocytes Relative: 10 %
Neutro Abs: 5.4 10*3/uL (ref 1.7–7.7)
Neutrophils Relative %: 60 %
Platelets: 261 10*3/uL (ref 150–400)
RBC: 4.54 MIL/uL (ref 4.22–5.81)
RDW: 12.1 % (ref 11.5–15.5)
WBC: 8.9 10*3/uL (ref 4.0–10.5)
nRBC: 0 % (ref 0.0–0.2)

## 2022-07-28 LAB — COMPREHENSIVE METABOLIC PANEL
ALT: 23 U/L (ref 0–44)
AST: 19 U/L (ref 15–41)
Albumin: 3.5 g/dL (ref 3.5–5.0)
Alkaline Phosphatase: 56 U/L (ref 38–126)
Anion gap: 9 (ref 5–15)
BUN: 22 mg/dL — ABNORMAL HIGH (ref 6–20)
CO2: 25 mmol/L (ref 22–32)
Calcium: 9 mg/dL (ref 8.9–10.3)
Chloride: 105 mmol/L (ref 98–111)
Creatinine, Ser: 1.08 mg/dL (ref 0.61–1.24)
GFR, Estimated: >60 mL/min (ref >60)
Glucose, Bld: 93 mg/dL (ref 70–99)
Potassium: 3.8 mmol/L (ref 3.5–5.1)
Sodium: 139 mmol/L (ref 135–145)
Total Bilirubin: 0.1 mg/dL — ABNORMAL LOW (ref 0.3–1.2)
Total Protein: 7.1 g/dL (ref 6.5–8.1)

## 2022-07-28 LAB — POC SARS CORONAVIRUS 2 AG: SARSCOV2ONAVIRUS 2 AG: NEGATIVE

## 2022-07-28 LAB — POCT URINE DRUG SCREEN - MANUAL ENTRY (I-SCREEN)
POC Amphetamine UR: NOT DETECTED
POC Buprenorphine (BUP): NOT DETECTED
POC Cocaine UR: POSITIVE — AB
POC Marijuana UR: POSITIVE — AB
POC Methadone UR: NOT DETECTED
POC Methamphetamine UR: NOT DETECTED
POC Morphine: POSITIVE — AB
POC Oxazepam (BZO): NOT DETECTED
POC Oxycodone UR: NOT DETECTED
POC Secobarbital (BAR): NOT DETECTED

## 2022-07-28 LAB — RESP PANEL BY RT-PCR (FLU A&B, COVID) ARPGX2
Influenza A by PCR: NEGATIVE
Influenza B by PCR: NEGATIVE
SARS Coronavirus 2 by RT PCR: NEGATIVE

## 2022-07-28 LAB — TSH: TSH: 0.95 u[IU]/mL (ref 0.350–4.500)

## 2022-07-28 NOTE — Discharge Instructions (Addendum)
Discharge recommendations:   Please see information for opioid detox treatment at [facility] in [place].   Please follow up with your primary care provider for all medical related needs.   Safety:  The patient should abstain from use of illicit substances/drugs and abuse of any medications. If symptoms worsen or do not continue to improve or if the patient becomes actively suicidal or homicidal then it is recommended that the patient return to the closest hospital emergency department, the [facility], or call 911 for further evaluation and treatment. National Suicide Prevention Lifeline 1-800-SUICIDE or 772-546-9207.  About 988 988 offers 24/7 access to trained crisis counselors who can help people experiencing mental health-related distress. People can call or text 988 or chat 988lifeline.org for themselves or if they are worried about a loved one who may need crisis support.

## 2022-07-28 NOTE — ED Provider Notes (Signed)
FBC/OBS ASAP Discharge Summary  Date and Time: 07/28/2022 9:03 AM  Name: John [name]  MRN:  [id number]   Discharge Diagnoses:  Final diagnoses:  Polysubstance abuse (HCC)  Fentanyl use disorder, severe, dependence (HCC)  Opioid use disorder, severe, dependence (HCC)    Subjective: Patient seen and evaluated face to face by this provider, chart reviewed and case discussed with Dr. Viviano Simas. Today, patient denies active SI or HI. He verbally contracts for safety. He reports passive SI for the past two weeks but denies a suicide plan or intent. He denies AVH. There is no objective evidence that the patient is responding to internal or external stimuli. He endorses depressive symptoms of sadness, hopelessness, and anhedonia. He reports fair sleep last night. He reports a fair appetite today. He reports withdrawal symptoms of sweats and chills. He denies other physical complaints at this time. He states that he wants to detox from opiates so that he can start suboxone treatment at [facility]. He states that they recommended he get detox first. I discussed with the patient that his UDS shows pos for morphine, cocaine and THC. He denies using cocaine or morphine and states that it must have been mixed with the heroin. He reports using a 1/2 gram of heroin every day for the past year. He states that he last used heroin yesterday.   Stay Summary:  On 07/27/22, John [name] a 26 year old male with past psychiatric history of bipolar disorder, depression, generalized anxiety disorder, polysubstance abuse, PTSD, opioid use disorder and medical history of hepatitis C presented seeking substance abuse treatment/detox from fentanyl, so he can get on Suboxone. Patient reported that he relapsed about 6 months ago and have been daily using about half a gram of fentanyl (snorts and injects/IV), and last used today.   Patient was admitted to the [facility] continuous assessment unit. Labs obtained included a CBC, CMP, Hep  panel, Lipid panel, TSH, EKG, covid and UDS. UDS pos for cocaine, morphine and THC.  Patient was observed on the unit without any psychotic, disruptive, aggressive or self harm behaviors. Vital signs are stable. Patient reported withdrawal symptoms of sweats and chills. Patient does not appear to be in acute distress and is safe to transport to [facility] in [place] for detox treatment. Patient's significant other is currently admitted to the [facility] which is a barrier and conflict of interest for the patient to be admitted to the [facility] at this time. Patient is agreeable to treatment at [facility] in [place]. Patient to be transported via [employer]. I spoke to Nolene Bernheim, RN at [facility] in [place] who confirmed [facility] availability for detox treatment.   Total Time spent with patient: 30 minutes  Past Psychiatric History: past psychiatric history of bipolar disorder, depression, generalized anxiety disorder, polysubstance abuse, PTSD, and opioid use disorder. Per chart review, patient has multiple admissions for substance abuse and SI and was recently admitted inpatient at [facility] from 03/05/22-03/09/22. He was also at the [facility] on 01/28/22-01/31/2022 for polysubstance abuse Past Medical History:  Past Medical History:  Diagnosis Date   Acne nodule    on going   Aggressive behavior 04/13/2019   Hepatitis C    OD (overdose of drug), intentional self-harm, initial encounter (HCC) 04/13/2019   Substance abuse (HCC) 04/13/2019   Suicide attempt (HCC) 04/13/2019    Past Surgical History:  Procedure Laterality Date   ORIF FINGER / THUMB FRACTURE     Family History:  Family History  Problem Relation Age of Onset  Bipolar disorder Mother    Liver disease Maternal Grandfather        etoh   Colon cancer Neg Hx    Family Psychiatric History: Mother history of bipolar and anxiety  Social History:  Social History   Substance and Sexual Activity  Alcohol Use Not Currently   Alcohol/week: 6.0 standard  drinks of alcohol   Types: 6 Shots of liquor per week   Comment: history of heavy alcohol use; trying to cut back 09/28/20; no etoh 09/10/20. no etoh 03/23/21.     Social History   Substance and Sexual Activity  Drug Use Not Currently   Types: Marijuana, Oxycodone, Methamphetamines   Comment: heroin; denied 11/24/20, denied 03/23/21    Social History   Socioeconomic History   Marital status: Single    Spouse name: Not on file   Number of children: Not on file   Years of education: Not on file   Highest education level: Not on file  Occupational History   Not on file  Tobacco Use   Smoking status: Every Day    Packs/day: 1.00    Years: 5.00    Total pack years: 5.00    Types: Cigarettes   Smokeless tobacco: Never  Vaping Use   Vaping Use: Never used  Substance and Sexual Activity   Alcohol use: Not Currently    Alcohol/week: 6.0 standard drinks of alcohol    Types: 6 Shots of liquor per week    Comment: history of heavy alcohol use; trying to cut back 09/28/20; no etoh 09/10/20. no etoh 03/23/21.   Drug use: Not Currently    Types: Marijuana, Oxycodone, Methamphetamines    Comment: heroin; denied 11/24/20, denied 03/23/21   Sexual activity: Yes    Birth control/protection: Condom  Other Topics Concern   Not on file  Social History Narrative   Not on file   Social Determinants of Health   Financial Resource Strain: Not on file  Food Insecurity: Not on file  Transportation Needs: Not on file  Physical Activity: Not on file  Stress: Not on file  Social Connections: Not on file   SDOH:  SDOH Screenings   Alcohol Screen: Low Risk  (03/05/2022)  Depression (PHQ2-9): Low Risk  (10/14/2020)  Tobacco Use: High Risk (07/20/2022)    Tobacco Cessation:  N/A, patient does not currently use tobacco products  Current Medications:  Current Facility-Administered Medications  Medication Dose Route Frequency Provider Last Rate Last Admin   alum & mag hydroxide-simeth  (MAALOX/MYLANTA) 200-200-20 MG/5ML suspension 30 mL  30 mL Oral Q4H PRN Onuoha, Chinwendu V, NP       dicyclomine (BENTYL) tablet 20 mg  20 mg Oral Q6H PRN Onuoha, Chinwendu V, NP       hydrOXYzine (ATARAX) tablet 25 mg  25 mg Oral Q6H PRN Onuoha, Chinwendu V, NP       loperamide (IMODIUM) capsule 2-4 mg  2-4 mg Oral PRN Onuoha, Chinwendu V, NP       magnesium hydroxide (MILK OF MAGNESIA) suspension 30 mL  30 mL Oral Daily PRN Onuoha, Chinwendu V, NP       methocarbamol (ROBAXIN) tablet 500 mg  500 mg Oral Q8H PRN Onuoha, Chinwendu V, NP       naproxen (NAPROSYN) tablet 500 mg  500 mg Oral BID PRN Onuoha, Chinwendu V, NP       ondansetron (ZOFRAN-ODT) disintegrating tablet 4 mg  4 mg Oral Q6H PRN Onuoha, Chinwendu V, NP  No current outpatient medications on file.    PTA Medications: (Not in a hospital admission)      10/14/2020    2:18 PM  Depression screen PHQ 2/9  Decreased Interest 0  Down, Depressed, Hopeless 0  PHQ - 2 Score 0    Flowsheet Row ED from 07/27/2022 in [facility] ED from 07/20/2022 in [facility] DEPT Admission (Discharged) from 03/05/2022 in [place] [facility]  C-SSRS RISK CATEGORY Error: Question 6 not populated No Risk High Risk       Musculoskeletal  Strength & Muscle Tone: within normal limits Gait & Station: normal Patient leans: N/A  Psychiatric Specialty Exam  Presentation  General Appearance: Appropriate for Environment  Eye Contact:Fair  Speech:Clear and Coherent  Speech Volume:Normal  Handedness:Right   Mood and Affect  Mood:Anxious  Affect:Congruent   Thought Process  Thought Processes:Coherent  Descriptions of Associations:Intact  Orientation:Full (Time, Place and Person)  Thought Content:Logical  Diagnosis of Schizophrenia or Schizoaffective disorder in past: No    Hallucinations:Hallucinations: None  Ideas of  Reference:None  Suicidal Thoughts:Suicidal Thoughts: No SI Passive Intent and/or Plan: Without Plan; Without Intent  Homicidal Thoughts:Homicidal Thoughts: No   Sensorium  Memory:Immediate Fair; Recent Fair; Remote Fair  Judgment:Intact  Insight:None   Executive Functions  Concentration:Fair  Attention Span:Fair  [place]   Psychomotor Activity  Psychomotor Activity:Psychomotor Activity: Normal   Assets  Assets:Communication Skills; Desire for Improvement; Financial Resources/Insurance; Housing; Intimacy; Leisure Time; Physical Health; Social Support   Sleep  Sleep:Sleep: Fair Number of Hours of Sleep: 8   Nutritional Assessment (For OBS and FBC admissions only) Has the patient had a weight loss or gain of 10 pounds or more in the last 3 months?: No Has the patient had a decrease in food intake/or appetite?: No Does the patient have dental problems?: No Does the patient have eating habits or behaviors that may be indicators of an eating disorder including binging or inducing vomiting?: No Has the patient recently lost weight without trying?: 0 Has the patient been eating poorly because of a decreased appetite?: 0 Malnutrition Screening Tool Score: 0    Physical Exam  Physical Exam HENT:     Head: Normocephalic.     Nose: Nose normal.  Eyes:     Conjunctiva/sclera: Conjunctivae normal.  Cardiovascular:     Rate and Rhythm: Normal rate.  Pulmonary:     Effort: Pulmonary effort is normal.  Musculoskeletal:        General: Normal range of motion.  Neurological:     Mental Status: He is alert and oriented to person, place, and time.    Review of Systems  Constitutional:  Positive for chills and diaphoresis.  HENT: Negative.    Eyes: Negative.   Respiratory: Negative.    Cardiovascular: Negative.   Gastrointestinal: Negative.   Genitourinary: Negative.   Musculoskeletal: Negative.   Skin: Negative.    Neurological: Negative.   Endo/Heme/Allergies: Negative.   Psychiatric/Behavioral:  Positive for depression and substance abuse.    Blood pressure 116/86, pulse 85, temperature 98.1 F (36.7 C), temperature source Oral, resp. rate 16, SpO2 99 %. There is no height or weight on file to calculate BMI.  Demographic Factors:  Male, Caucasian, and Low socioeconomic status  Loss Factors: Financial problems/change in socioeconomic status  Historical Factors: Family history of mental illness or substance abuse  Risk Reduction Factors:   Sense of responsibility to family, Employed,  Living with another person, especially a relative, and Positive social support  Continued Clinical Symptoms:  Alcohol/Substance Abuse/Dependencies  Cognitive Features That Contribute To Risk:  None    Suicide Risk:  Minimal: No identifiable suicidal ideation.  Patients presenting with no risk factors but with morbid ruminations; may be classified as minimal risk based on the severity of the depressive symptoms  Plan Of Care/Follow-up recommendations:  Activity:  as tolerated.   Disposition: Discharge to self. Transferred to [facility] in [place], [place]., via [employer] for opioid detox [facility]BC.   Aloura Matsuoka L, NP 07/28/2022, 9:03 AM

## 2022-07-28 NOTE — ED Notes (Signed)
His significant other is on [facility]

## 2022-07-28 NOTE — ED Notes (Signed)
Pt is meeting with provider at this time.

## 2022-07-28 NOTE — ED Notes (Signed)
EKG obtained.  Pt is asking when he can go to [facility].  Pt was told that the provider would have to let him know.

## 2022-07-28 NOTE — ED Notes (Signed)
Pt sleeping@this time. Breathing even and unlabored. Will continue to monitor for safety 

## 2022-07-28 NOTE — ED Notes (Incomplete)
[name] who presents unaccompanied to [facility] requesting treatment for heroin use and mental health symptoms. Pt has a history of bipolar disorder and substance use. He says two months ago he returned to using heroin and stopped taking lithium and his other psychiatric medications. He reports injecting approximately 0.5 grams of heroin daily. He denies alcohol or other substance use. He says he is seeking treatment at this time because he is tired of using substances and needs to get back on psychiatric medications. He describes his mood as "down" and acknowledges symptoms including crying spells, social withdrawal, loss of interest in usual pleasures, fatigue, irritability, decreased concentration, erratic sleep, decreased appetite and feelings of guilt, worthlessness and hopelessness. He reports recurring suicidal ideation with plan to "do anything. Blow my brains out." He denies history of suicide attempts. Pt denies any history of intentional self-injurious behaviors. Pt denies current homicidal ideation or history of violence. Pt denies any history of auditory or visual hallucinations.

## 2022-07-28 NOTE — ED Notes (Signed)
Patient is a 26 year old caucasian male with past psychiatric history of bipolar disorder, depression, generalized anxiety disorder, polysubstance abuse, PTSD, and opioid use disorder.    Patient reports he is primary seeking substance abuse treatment/detox from fentanyl, so he can get on Suboxone.  Patient reports he relapsed about 6 months ago and have been daily using about half a gram of fentanyl (snorts and injects/IV), and last used today. Patient endorses suicidal thoughts off/on.  Patient denies having any plans or intent.  Patient endorses being depressed specifically since his recent relapse.  Patient endorses his depressive symptoms as sadness, anxiety, and suicidal thoughts with no plan/intent. Patient endorses experiencing withdrawal symptoms in the past such as cold sweats, body aches, feeling really sick, and nausea, but currently denies any at this time. patient is alert, oriented x 3, and cooperative. Speech is clear, coherent and logical. Pt appears appropriate for the environment. Eye contact is fair. Mood is anxious and depressed, affect is congruent with mood. Thought process is logical and thought content is coherent. Pt endorses passive SI with no plan/intent, denies HI/AVH. There is no indication that the patient is responding to internal stimuli. No delusions elicited during this assessment. Skin assessment and search completed. Oriented to the unit, food served and juice was given. Patient now in bed no s/s of withdrawal noted or acute distress. Will continue to monitor and update as needed

## 2022-08-18 ENCOUNTER — Emergency Department (HOSPITAL_COMMUNITY): Payer: Self-pay

## 2022-08-18 ENCOUNTER — Ambulatory Visit (HOSPITAL_BASED_OUTPATIENT_CLINIC_OR_DEPARTMENT_OTHER): Payer: Self-pay | Admitting: Orthopaedic Surgery

## 2022-08-18 ENCOUNTER — Inpatient Hospital Stay (HOSPITAL_COMMUNITY)
Admission: EM | Admit: 2022-08-18 | Discharge: 2022-08-23 | DRG: 957 | Disposition: A | Discharge status: Home / Self Care | Payer: Self-pay | Attending: Surgery | Admitting: Surgery

## 2022-08-18 DIAGNOSIS — F411 Generalized anxiety disorder: Secondary | ICD-10-CM | POA: Diagnosis present

## 2022-08-18 DIAGNOSIS — Y92410 Unspecified street and highway as the place of occurrence of the external cause: Secondary | ICD-10-CM | POA: Diagnosis not present

## 2022-08-18 DIAGNOSIS — Z634 Disappearance and death of family member: Secondary | ICD-10-CM

## 2022-08-18 DIAGNOSIS — F17210 Nicotine dependence, cigarettes, uncomplicated: Secondary | ICD-10-CM | POA: Diagnosis present

## 2022-08-18 DIAGNOSIS — Z818 Family history of other mental and behavioral disorders: Secondary | ICD-10-CM

## 2022-08-18 DIAGNOSIS — Z9151 Personal history of suicidal behavior: Secondary | ICD-10-CM | POA: Diagnosis not present

## 2022-08-18 DIAGNOSIS — K661 Hemoperitoneum: Secondary | ICD-10-CM | POA: Diagnosis present

## 2022-08-18 DIAGNOSIS — F319 Bipolar disorder, unspecified: Secondary | ICD-10-CM | POA: Diagnosis present

## 2022-08-18 DIAGNOSIS — S27321A Contusion of lung, unilateral, initial encounter: Secondary | ICD-10-CM | POA: Diagnosis present

## 2022-08-18 DIAGNOSIS — F1590 Other stimulant use, unspecified, uncomplicated: Secondary | ICD-10-CM | POA: Diagnosis present

## 2022-08-18 DIAGNOSIS — S36039A Unspecified laceration of spleen, initial encounter: Principal | ICD-10-CM

## 2022-08-18 DIAGNOSIS — F1290 Cannabis use, unspecified, uncomplicated: Secondary | ICD-10-CM | POA: Diagnosis present

## 2022-08-18 DIAGNOSIS — S36032A Major laceration of spleen, initial encounter: Principal | ICD-10-CM

## 2022-08-18 DIAGNOSIS — S32591A Other specified fracture of right pubis, initial encounter for closed fracture: Secondary | ICD-10-CM | POA: Diagnosis present

## 2022-08-18 DIAGNOSIS — K567 Ileus, unspecified: Secondary | ICD-10-CM | POA: Diagnosis not present

## 2022-08-18 DIAGNOSIS — F4310 Post-traumatic stress disorder, unspecified: Secondary | ICD-10-CM | POA: Diagnosis present

## 2022-08-18 DIAGNOSIS — Z8619 Personal history of other infectious and parasitic diseases: Secondary | ICD-10-CM | POA: Diagnosis not present

## 2022-08-18 DIAGNOSIS — N179 Acute kidney failure, unspecified: Secondary | ICD-10-CM | POA: Diagnosis present

## 2022-08-18 DIAGNOSIS — R7401 Elevation of levels of liver transaminase levels: Secondary | ICD-10-CM | POA: Diagnosis present

## 2022-08-18 DIAGNOSIS — R1114 Bilious vomiting: Secondary | ICD-10-CM | POA: Diagnosis not present

## 2022-08-18 DIAGNOSIS — F1910 Other psychoactive substance abuse, uncomplicated: Secondary | ICD-10-CM | POA: Diagnosis present

## 2022-08-18 DIAGNOSIS — F39 Unspecified mood [affective] disorder: Secondary | ICD-10-CM | POA: Diagnosis present

## 2022-08-18 DIAGNOSIS — D62 Acute posthemorrhagic anemia: Secondary | ICD-10-CM | POA: Diagnosis not present

## 2022-08-18 DIAGNOSIS — S82251A Displaced comminuted fracture of shaft of right tibia, initial encounter for closed fracture: Secondary | ICD-10-CM

## 2022-08-18 DIAGNOSIS — S50811A Abrasion of right forearm, initial encounter: Secondary | ICD-10-CM | POA: Diagnosis present

## 2022-08-18 DIAGNOSIS — S52124A Nondisplaced fracture of head of right radius, initial encounter for closed fracture: Secondary | ICD-10-CM

## 2022-08-18 DIAGNOSIS — S32434A Nondisplaced fracture of anterior column [iliopubic] of right acetabulum, initial encounter for closed fracture: Secondary | ICD-10-CM

## 2022-08-18 DIAGNOSIS — G4700 Insomnia, unspecified: Secondary | ICD-10-CM | POA: Diagnosis present

## 2022-08-18 DIAGNOSIS — S40211A Abrasion of right shoulder, initial encounter: Secondary | ICD-10-CM | POA: Diagnosis present

## 2022-08-18 DIAGNOSIS — S90812A Abrasion, left foot, initial encounter: Secondary | ICD-10-CM | POA: Diagnosis present

## 2022-08-18 DIAGNOSIS — S82831A Other fracture of upper and lower end of right fibula, initial encounter for closed fracture: Secondary | ICD-10-CM | POA: Diagnosis present

## 2022-08-18 DIAGNOSIS — R109 Unspecified abdominal pain: Secondary | ICD-10-CM | POA: Diagnosis present

## 2022-08-18 DIAGNOSIS — Z886 Allergy status to analgesic agent status: Secondary | ICD-10-CM

## 2022-08-18 DIAGNOSIS — Z888 Allergy status to other drugs, medicaments and biological substances status: Secondary | ICD-10-CM

## 2022-08-18 DIAGNOSIS — Z62810 Personal history of physical and sexual abuse in childhood: Secondary | ICD-10-CM | POA: Diagnosis not present

## 2022-08-18 DIAGNOSIS — S32110A Nondisplaced Zone I fracture of sacrum, initial encounter for closed fracture: Secondary | ICD-10-CM | POA: Diagnosis present

## 2022-08-18 DIAGNOSIS — S80211A Abrasion, right knee, initial encounter: Secondary | ICD-10-CM | POA: Diagnosis present

## 2022-08-18 DIAGNOSIS — S82391A Other fracture of lower end of right tibia, initial encounter for closed fracture: Secondary | ICD-10-CM | POA: Diagnosis present

## 2022-08-18 DIAGNOSIS — F1199 Opioid use, unspecified with unspecified opioid-induced disorder: Secondary | ICD-10-CM | POA: Diagnosis not present

## 2022-08-18 DIAGNOSIS — R7989 Other specified abnormal findings of blood chemistry: Secondary | ICD-10-CM | POA: Diagnosis present

## 2022-08-18 LAB — CBC
HCT: 40.4 % (ref 39.0–52.0)
Hemoglobin: 13.9 g/dL (ref 13.0–17.0)
MCH: 29.1 pg (ref 26.0–34.0)
MCHC: 34.4 g/dL (ref 30.0–36.0)
MCV: 84.7 fL (ref 80.0–100.0)
Platelets: 257 10*3/uL (ref 150–400)
RBC: 4.77 MIL/uL (ref 4.22–5.81)
RDW: 13.2 % (ref 11.5–15.5)
WBC: 13.2 10*3/uL — ABNORMAL HIGH (ref 4.0–10.5)
nRBC: 0 % (ref 0.0–0.2)

## 2022-08-18 LAB — COMPREHENSIVE METABOLIC PANEL
ALT: 548 U/L — ABNORMAL HIGH (ref 0–44)
AST: 361 U/L — ABNORMAL HIGH (ref 15–41)
Albumin: 4 g/dL (ref 3.5–5.0)
Alkaline Phosphatase: 71 U/L (ref 38–126)
Anion gap: 11 (ref 5–15)
BUN: 27 mg/dL — ABNORMAL HIGH (ref 6–20)
CO2: 20 mmol/L — ABNORMAL LOW (ref 22–32)
Calcium: 9.1 mg/dL (ref 8.9–10.3)
Chloride: 111 mmol/L (ref 98–111)
Creatinine, Ser: 1.68 mg/dL — ABNORMAL HIGH (ref 0.61–1.24)
GFR, Estimated: 56 mL/min — ABNORMAL LOW (ref >60)
Glucose, Bld: 121 mg/dL — ABNORMAL HIGH (ref 70–99)
Potassium: 3.9 mmol/L (ref 3.5–5.1)
Sodium: 142 mmol/L (ref 135–145)
Total Bilirubin: 0.8 mg/dL (ref 0.3–1.2)
Total Protein: 7.4 g/dL (ref 6.5–8.1)

## 2022-08-18 LAB — PROTIME-INR
INR: 1.2 (ref 0.8–1.2)
Prothrombin Time: 15.5 seconds — ABNORMAL HIGH (ref 11.4–15.2)

## 2022-08-18 LAB — I-STAT CHEM 8, ED
BUN: 28 mg/dL — ABNORMAL HIGH (ref 6–20)
Calcium, Ion: 1.05 mmol/L — ABNORMAL LOW (ref 1.15–1.40)
Chloride: 109 mmol/L (ref 98–111)
Creatinine, Ser: 1.6 mg/dL — ABNORMAL HIGH (ref 0.61–1.24)
Glucose, Bld: 121 mg/dL — ABNORMAL HIGH (ref 70–99)
HCT: 40 % (ref 39.0–52.0)
Hemoglobin: 13.6 g/dL (ref 13.0–17.0)
Potassium: 3.9 mmol/L (ref 3.5–5.1)
Sodium: 142 mmol/L (ref 135–145)
TCO2: 19 mmol/L — ABNORMAL LOW (ref 22–32)

## 2022-08-18 LAB — SAMPLE TO BLOOD BANK

## 2022-08-18 LAB — LACTIC ACID, PLASMA: Lactic Acid, Venous: 2.8 mmol/L (ref 0.5–1.9)

## 2022-08-18 LAB — ETHANOL: Alcohol, Ethyl (B): <10 mg/dL (ref <10)

## 2022-08-18 MED ORDER — KETAMINE HCL 50 MG/5ML IJ SOSY
30.0000 mg | PREFILLED_SYRINGE | Freq: Once | INTRAMUSCULAR | Status: AC
  Administered 2022-08-18: 30 mg via INTRAVENOUS

## 2022-08-18 MED ORDER — ONDANSETRON HCL 4 MG/2ML IJ SOLN
4.0000 mg | Freq: Once | INTRAMUSCULAR | Status: AC
  Administered 2022-08-18: 4 mg via INTRAVENOUS

## 2022-08-18 MED ORDER — FENTANYL CITRATE PF 50 MCG/ML IJ SOSY
PREFILLED_SYRINGE | INTRAMUSCULAR | Status: AC
  Administered 2022-08-18: 100 ug
  Filled 2022-08-18: qty 2

## 2022-08-18 MED ORDER — MIDAZOLAM HCL 2 MG/2ML IJ SOLN
INTRAMUSCULAR | Status: AC
  Filled 2022-08-18: qty 2

## 2022-08-18 MED ORDER — KETAMINE HCL 50 MG/5ML IJ SOSY
PREFILLED_SYRINGE | INTRAMUSCULAR | Status: AC
  Administered 2022-08-18: 75 mg via INTRAVENOUS
  Filled 2022-08-18: qty 15

## 2022-08-18 MED ORDER — MIDAZOLAM HCL 2 MG/2ML IJ SOLN
INTRAMUSCULAR | Status: AC | PRN
  Administered 2022-08-18 (×3): 2 mg via INTRAVENOUS

## 2022-08-18 MED ORDER — TETANUS-DIPHTH-ACELL PERTUSSIS 5-2.5-18.5 LF-MCG/0.5 IM SUSY: 0.5000 mL | PREFILLED_SYRINGE | Freq: Once | INTRAMUSCULAR | Status: DC

## 2022-08-18 MED ORDER — KETAMINE HCL 50 MG/5ML IJ SOSY
PREFILLED_SYRINGE | INTRAMUSCULAR | Status: AC
  Filled 2022-08-18: qty 5

## 2022-08-18 MED ORDER — IOHEXOL 350 MG/ML SOLN
75.0000 mL | Freq: Once | INTRAVENOUS | Status: AC | PRN
  Administered 2022-08-18: 75 mL via INTRAVENOUS

## 2022-08-18 MED ORDER — KETAMINE HCL 50 MG/5ML IJ SOSY: 75.0000 mg | PREFILLED_SYRINGE | Freq: Once | INTRAMUSCULAR | Status: AC

## 2022-08-18 MED ORDER — SODIUM CHLORIDE 0.9 % IV SOLN
INTRAVENOUS | Status: AC | PRN
  Administered 2022-08-18: 1000 mL via INTRAVENOUS
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
## 2022-08-18 NOTE — ED Notes (Signed)
Patient transported to CT w/ Elyshia Kumagai RN, Jesus NT, ortho tech x2, security and Autumn TRN

## 2022-08-18 NOTE — ED Notes (Signed)
Pt gives his grandmother, Lowry Bowl, permission to make all medical decisions for him. [phone]

## 2022-08-18 NOTE — Progress Notes (Signed)
Orthopedic Tech Progress Note Patient Details:  John [name] Oct 17, 1996 [id number]  Patient ID: [name], male   DOB: [birth year], 26 y.o.   MRN: [id number] Short leg and posterior slab applied per request of EDP to stabilize leg before receiving xray.  Vernona Rieger 08/18/2022, 8:58 PM

## 2022-08-18 NOTE — ED Notes (Addendum)
Grandmother and grandfather at bedside.
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
## 2022-08-18 NOTE — ED Notes (Signed)
Pt continually yelling cursing and yelling at staff, pt demanding water to drink. Repeated attempts made to explain to pt he is NPO at this time due to potential surgical intervention. Pt unwilling to follow commands in bed.
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
## 2022-08-18 NOTE — ED Notes (Signed)
Dr. Redmond Pulling at bedside speaking with patient and family
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
## 2022-08-18 NOTE — ED Notes (Signed)
Family updated as to patient's status. Autumn TRN called pt's grandmother, Lowry Bowl

## 2022-08-19 ENCOUNTER — Encounter (HOSPITAL_COMMUNITY): Payer: Self-pay

## 2022-08-19 ENCOUNTER — Encounter (HOSPITAL_COMMUNITY): Admission: EM | Disposition: A | Discharge status: Home / Self Care | Payer: Self-pay | Source: Home / Self Care

## 2022-08-19 ENCOUNTER — Inpatient Hospital Stay (HOSPITAL_COMMUNITY): Payer: Self-pay | Admitting: Certified Registered"

## 2022-08-19 ENCOUNTER — Other Ambulatory Visit: Payer: Self-pay

## 2022-08-19 ENCOUNTER — Inpatient Hospital Stay (HOSPITAL_COMMUNITY): Payer: Self-pay

## 2022-08-19 DIAGNOSIS — F419 Anxiety disorder, unspecified: Secondary | ICD-10-CM

## 2022-08-19 DIAGNOSIS — S82401A Unspecified fracture of shaft of right fibula, initial encounter for closed fracture: Secondary | ICD-10-CM

## 2022-08-19 DIAGNOSIS — S52121A Displaced fracture of head of right radius, initial encounter for closed fracture: Secondary | ICD-10-CM

## 2022-08-19 DIAGNOSIS — S82251A Displaced comminuted fracture of shaft of right tibia, initial encounter for closed fracture: Secondary | ICD-10-CM | POA: Diagnosis present

## 2022-08-19 DIAGNOSIS — S82201A Unspecified fracture of shaft of right tibia, initial encounter for closed fracture: Secondary | ICD-10-CM

## 2022-08-19 DIAGNOSIS — S52124A Nondisplaced fracture of head of right radius, initial encounter for closed fracture: Secondary | ICD-10-CM

## 2022-08-19 HISTORY — PX: TIBIA IM NAIL INSERTION: SHX2516

## 2022-08-19 LAB — CBC
HCT: 35.6 % — ABNORMAL LOW (ref 39.0–52.0)
HCT: 34 % — ABNORMAL LOW (ref 39.0–52.0)
HCT: 31.4 % — ABNORMAL LOW (ref 39.0–52.0)
Hemoglobin: 12.3 g/dL — ABNORMAL LOW (ref 13.0–17.0)
Hemoglobin: 11.3 g/dL — ABNORMAL LOW (ref 13.0–17.0)
Hemoglobin: 10.8 g/dL — ABNORMAL LOW (ref 13.0–17.0)
MCH: 28.9 pg (ref 26.0–34.0)
MCH: 28.3 pg (ref 26.0–34.0)
MCH: 28.6 pg (ref 26.0–34.0)
MCHC: 34.6 g/dL (ref 30.0–36.0)
MCHC: 33.2 g/dL (ref 30.0–36.0)
MCHC: 34.4 g/dL (ref 30.0–36.0)
MCV: 83.6 fL (ref 80.0–100.0)
MCV: 85.2 fL (ref 80.0–100.0)
MCV: 83.3 fL (ref 80.0–100.0)
Platelets: 192 10*3/uL (ref 150–400)
Platelets: 167 10*3/uL (ref 150–400)
Platelets: 147 10*3/uL — ABNORMAL LOW (ref 150–400)
RBC: 4.26 MIL/uL (ref 4.22–5.81)
RBC: 3.99 MIL/uL — ABNORMAL LOW (ref 4.22–5.81)
RBC: 3.77 MIL/uL — ABNORMAL LOW (ref 4.22–5.81)
RDW: 13.3 % (ref 11.5–15.5)
RDW: 13.3 % (ref 11.5–15.5)
RDW: 13.2 % (ref 11.5–15.5)
WBC: 10.8 10*3/uL — ABNORMAL HIGH (ref 4.0–10.5)
WBC: 9 10*3/uL (ref 4.0–10.5)
WBC: 9.4 10*3/uL (ref 4.0–10.5)
nRBC: 0 % (ref 0.0–0.2)
nRBC: 0 % (ref 0.0–0.2)
nRBC: 0 % (ref 0.0–0.2)

## 2022-08-19 LAB — COMPREHENSIVE METABOLIC PANEL
ALT: 513 U/L — ABNORMAL HIGH (ref 0–44)
AST: 303 U/L — ABNORMAL HIGH (ref 15–41)
Albumin: 3.5 g/dL (ref 3.5–5.0)
Alkaline Phosphatase: 58 U/L (ref 38–126)
Anion gap: 11 (ref 5–15)
BUN: 26 mg/dL — ABNORMAL HIGH (ref 6–20)
CO2: 20 mmol/L — ABNORMAL LOW (ref 22–32)
Calcium: 8.8 mg/dL — ABNORMAL LOW (ref 8.9–10.3)
Chloride: 108 mmol/L (ref 98–111)
Creatinine, Ser: 1.3 mg/dL — ABNORMAL HIGH (ref 0.61–1.24)
GFR, Estimated: >60 mL/min (ref >60)
Glucose, Bld: 103 mg/dL — ABNORMAL HIGH (ref 70–99)
Potassium: 4.1 mmol/L (ref 3.5–5.1)
Sodium: 139 mmol/L (ref 135–145)
Total Bilirubin: 1.2 mg/dL (ref 0.3–1.2)
Total Protein: 6.6 g/dL (ref 6.5–8.1)

## 2022-08-19 LAB — URINALYSIS, ROUTINE W REFLEX MICROSCOPIC
Bilirubin Urine: NEGATIVE
Glucose, UA: NEGATIVE mg/dL
Hgb urine dipstick: NEGATIVE
Ketones, ur: 5 mg/dL — AB
Leukocytes,Ua: NEGATIVE
Nitrite: NEGATIVE
Protein, ur: 30 mg/dL — AB
Specific Gravity, Urine: 1.033 — ABNORMAL HIGH (ref 1.005–1.030)
pH: 5 (ref 5.0–8.0)

## 2022-08-19 LAB — RAPID URINE DRUG SCREEN, HOSP PERFORMED
Amphetamines: POSITIVE — AB
Barbiturates: NOT DETECTED
Benzodiazepines: POSITIVE — AB
Cocaine: NOT DETECTED
Opiates: POSITIVE — AB
Tetrahydrocannabinol: POSITIVE — AB

## 2022-08-19 LAB — MRSA NEXT GEN BY PCR, NASAL: MRSA by PCR Next Gen: NOT DETECTED

## 2022-08-19 LAB — HIV ANTIBODY (ROUTINE TESTING W REFLEX): HIV Screen 4th Generation wRfx: NONREACTIVE

## 2022-08-19 SURGERY — INSERTION, INTRAMEDULLARY ROD, TIBIA
Anesthesia: General | Laterality: Right

## 2022-08-19 MED ORDER — PROMETHAZINE HCL 25 MG/ML IJ SOLN: 6.2500 mg | INTRAMUSCULAR | Status: DC | PRN

## 2022-08-19 MED ORDER — LIDOCAINE HCL (CARDIAC) PF 100 MG/5ML IV SOSY: PREFILLED_SYRINGE | INTRAVENOUS | Status: DC | PRN

## 2022-08-19 MED ORDER — PANTOPRAZOLE SODIUM 40 MG PO TBEC: 40.0000 mg | DELAYED_RELEASE_TABLET | Freq: Every day | ORAL | Status: DC

## 2022-08-19 MED ORDER — LACTATED RINGERS IV SOLN: INTRAVENOUS | Status: DC

## 2022-08-19 MED ORDER — PANTOPRAZOLE SODIUM 40 MG IV SOLR: 40.0000 mg | Freq: Every day | INTRAVENOUS | Status: DC

## 2022-08-19 MED ORDER — ONDANSETRON 4 MG PO TBDP: 4.0000 mg | ORAL_TABLET | Freq: Four times a day (QID) | ORAL | Status: DC | PRN

## 2022-08-19 MED ORDER — MIDAZOLAM HCL 2 MG/2ML IJ SOLN
INTRAMUSCULAR | Status: DC | PRN
  Administered 2022-08-19: 2 mg via INTRAVENOUS

## 2022-08-19 MED ORDER — ORAL CARE MOUTH RINSE: 15.0000 mL | Freq: Once | OROMUCOSAL | Status: AC

## 2022-08-19 MED ORDER — CHLORHEXIDINE GLUCONATE 4 % EX LIQD
60.0000 mL | Freq: Once | CUTANEOUS | Status: DC
  Filled 2022-08-19: qty 60

## 2022-08-19 MED ORDER — LIDOCAINE 2% (20 MG/ML) 5 ML SYRINGE
INTRAMUSCULAR | Status: AC
  Filled 2022-08-19: qty 5

## 2022-08-19 MED ORDER — 0.9 % SODIUM CHLORIDE (POUR BTL) OPTIME
TOPICAL | Status: DC | PRN
  Administered 2022-08-19: 1000 mL

## 2022-08-19 MED ORDER — OXYCODONE HCL 5 MG/5ML PO SOLN
ORAL | Status: AC
  Filled 2022-08-19: qty 5

## 2022-08-19 MED ORDER — ONDANSETRON HCL 4 MG/2ML IJ SOLN
4.0000 mg | Freq: Four times a day (QID) | INTRAMUSCULAR | Status: DC | PRN
  Administered 2022-08-20 – 2022-08-23 (×2): 4 mg via INTRAVENOUS
  Filled 2022-08-19 (×2): qty 2

## 2022-08-19 MED ORDER — KETAMINE HCL 50 MG/5ML IJ SOSY
PREFILLED_SYRINGE | INTRAMUSCULAR | Status: AC
  Filled 2022-08-19: qty 5

## 2022-08-19 MED ORDER — AMISULPRIDE (ANTIEMETIC) 5 MG/2ML IV SOLN: 10.0000 mg | Freq: Once | INTRAVENOUS | Status: DC | PRN

## 2022-08-19 MED ORDER — ORAL CARE MOUTH RINSE: 15.0000 mL | OROMUCOSAL | Status: DC | PRN

## 2022-08-19 MED ORDER — DEXAMETHASONE SODIUM PHOSPHATE 10 MG/ML IJ SOLN
INTRAMUSCULAR | Status: DC | PRN
  Administered 2022-08-19: 10 mg via INTRAVENOUS

## 2022-08-19 MED ORDER — HYDROMORPHONE HCL 1 MG/ML IJ SOLN
INTRAMUSCULAR | Status: AC
  Filled 2022-08-19: qty 0.5

## 2022-08-19 MED ORDER — TRANEXAMIC ACID-NACL 1000-0.7 MG/100ML-% IV SOLN
1000.0000 mg | INTRAVENOUS | Status: AC
  Administered 2022-08-19: 1000 mg via INTRAVENOUS
  Filled 2022-08-19: qty 100

## 2022-08-19 MED ORDER — FENTANYL CITRATE (PF) 100 MCG/2ML IJ SOLN
INTRAMUSCULAR | Status: AC
  Filled 2022-08-19: qty 2

## 2022-08-19 MED ORDER — ROCURONIUM BROMIDE 100 MG/10ML IV SOLN
INTRAVENOUS | Status: DC | PRN
  Administered 2022-08-19 (×2): 50 mg via INTRAVENOUS

## 2022-08-19 MED ORDER — OXYCODONE HCL 5 MG PO TABS: 2.5000 mg | ORAL_TABLET | ORAL | Status: DC | PRN

## 2022-08-19 MED ORDER — ROCURONIUM BROMIDE 10 MG/ML (PF) SYRINGE
PREFILLED_SYRINGE | INTRAVENOUS | Status: AC
  Filled 2022-08-19: qty 10

## 2022-08-19 MED ORDER — CHLORHEXIDINE GLUCONATE 0.12 % MT SOLN
15.0000 mL | Freq: Once | OROMUCOSAL | Status: AC
  Administered 2022-08-19: 15 mL via OROMUCOSAL

## 2022-08-19 MED ORDER — OXYCODONE HCL 5 MG PO TABS
5.0000 mg | ORAL_TABLET | ORAL | Status: DC | PRN
  Administered 2022-08-19: 5 mg via ORAL
  Filled 2022-08-19: qty 1

## 2022-08-19 MED ORDER — MIDAZOLAM HCL 2 MG/2ML IJ SOLN
INTRAMUSCULAR | Status: AC
  Filled 2022-08-19: qty 2

## 2022-08-19 MED ORDER — HALOPERIDOL LACTATE 5 MG/ML IJ SOLN
10.0000 mg | Freq: Three times a day (TID) | INTRAMUSCULAR | Status: DC | PRN
  Administered 2022-08-19: 10 mg via INTRAVENOUS

## 2022-08-19 MED ORDER — ONDANSETRON HCL 4 MG/2ML IJ SOLN
INTRAMUSCULAR | Status: AC
  Filled 2022-08-19: qty 2

## 2022-08-19 MED ORDER — POLYETHYLENE GLYCOL 3350 17 G PO PACK: 17.0000 g | PACK | Freq: Every day | ORAL | Status: DC | PRN

## 2022-08-19 MED ORDER — DEXMEDETOMIDINE HCL IN NACL 80 MCG/20ML IV SOLN
INTRAVENOUS | Status: DC | PRN
  Administered 2022-08-19 (×5): 8 ug via BUCCAL

## 2022-08-19 MED ORDER — DIPHENHYDRAMINE HCL 25 MG PO CAPS
50.0000 mg | ORAL_CAPSULE | Freq: Three times a day (TID) | ORAL | Status: DC | PRN
  Administered 2022-08-19 – 2022-08-20 (×3): 50 mg via ORAL
  Filled 2022-08-19 (×3): qty 2

## 2022-08-19 MED ORDER — LORAZEPAM 2 MG/ML IJ SOLN
1.0000 mg | Freq: Three times a day (TID) | INTRAMUSCULAR | Status: DC | PRN
  Administered 2022-08-19: 1 mg via INTRAVENOUS
  Filled 2022-08-19: qty 1

## 2022-08-19 MED ORDER — DEXMEDETOMIDINE HCL IN NACL 80 MCG/20ML IV SOLN
INTRAVENOUS | Status: AC
  Filled 2022-08-19: qty 20

## 2022-08-19 MED ORDER — CHLORHEXIDINE GLUCONATE CLOTH 2 % EX PADS
6.0000 | MEDICATED_PAD | Freq: Every day | CUTANEOUS | Status: DC
  Administered 2022-08-19 – 2022-08-23 (×3): 6 via TOPICAL

## 2022-08-19 MED ORDER — HALOPERIDOL 5 MG PO TABS
5.0000 mg | ORAL_TABLET | Freq: Three times a day (TID) | ORAL | Status: DC | PRN
  Filled 2022-08-19: qty 1

## 2022-08-19 MED ORDER — HALOPERIDOL LACTATE 5 MG/ML IJ SOLN
10.0000 mg | Freq: Three times a day (TID) | INTRAMUSCULAR | Status: DC | PRN
  Administered 2022-08-19: 10 mg via INTRAMUSCULAR
  Filled 2022-08-19 (×2): qty 2

## 2022-08-19 MED ORDER — POTASSIUM CHLORIDE IN NACL 20-0.9 MEQ/L-% IV SOLN: INTRAVENOUS | Status: DC

## 2022-08-19 MED ORDER — HALOPERIDOL 5 MG PO TABS: 5.0000 mg | ORAL_TABLET | Freq: Three times a day (TID) | ORAL | Status: DC | PRN

## 2022-08-19 MED ORDER — CHLORHEXIDINE GLUCONATE 0.12 % MT SOLN
OROMUCOSAL | Status: AC
  Filled 2022-08-19: qty 15

## 2022-08-19 MED ORDER — OXYCODONE HCL 5 MG PO TABS
10.0000 mg | ORAL_TABLET | ORAL | Status: DC | PRN
  Administered 2022-08-19 – 2022-08-20 (×3): 10 mg via ORAL
  Filled 2022-08-19 (×3): qty 2

## 2022-08-19 MED ORDER — PROPOFOL 10 MG/ML IV BOLUS
INTRAVENOUS | Status: AC
  Filled 2022-08-19: qty 20

## 2022-08-19 MED ORDER — KETAMINE HCL 10 MG/ML IJ SOLN
INTRAMUSCULAR | Status: DC | PRN
  Administered 2022-08-19: 30 mg via INTRAVENOUS
  Administered 2022-08-19: 20 mg via INTRAVENOUS

## 2022-08-19 MED ORDER — HYDROMORPHONE HCL 1 MG/ML IJ SOLN
1.0000 mg | INTRAMUSCULAR | Status: DC | PRN
  Administered 2022-08-19 – 2022-08-23 (×23): 1 mg via INTRAVENOUS
  Filled 2022-08-19 (×24): qty 1

## 2022-08-19 MED ORDER — OXYCODONE HCL 5 MG PO TABS: 5.0000 mg | ORAL_TABLET | Freq: Once | ORAL | Status: DC | PRN

## 2022-08-19 MED ORDER — SUGAMMADEX SODIUM 200 MG/2ML IV SOLN
INTRAVENOUS | Status: DC | PRN
  Administered 2022-08-19: 300 mg via INTRAVENOUS

## 2022-08-19 MED ORDER — HYDROMORPHONE HCL 1 MG/ML IJ SOLN
INTRAMUSCULAR | Status: DC | PRN
  Administered 2022-08-19: .5 mg via INTRAVENOUS

## 2022-08-19 MED ORDER — METHOCARBAMOL 1000 MG/10ML IJ SOLN: 500.0000 mg | Freq: Three times a day (TID) | INTRAVENOUS | Status: DC | PRN

## 2022-08-19 MED ORDER — FENTANYL CITRATE (PF) 250 MCG/5ML IJ SOLN
INTRAMUSCULAR | Status: AC
  Filled 2022-08-19: qty 5

## 2022-08-19 MED ORDER — LIDOCAINE 2% (20 MG/ML) 5 ML SYRINGE
INTRAMUSCULAR | Status: DC | PRN
  Administered 2022-08-19: 60 mg via INTRAVENOUS

## 2022-08-19 MED ORDER — METHOCARBAMOL 500 MG PO TABS
500.0000 mg | ORAL_TABLET | Freq: Three times a day (TID) | ORAL | Status: DC | PRN
  Administered 2022-08-19 (×2): 500 mg via ORAL
  Filled 2022-08-19 (×2): qty 1

## 2022-08-19 MED ORDER — FENTANYL CITRATE (PF) 100 MCG/2ML IJ SOLN
25.0000 ug | INTRAMUSCULAR | Status: DC | PRN
  Administered 2022-08-19 (×3): 50 ug via INTRAVENOUS

## 2022-08-19 MED ORDER — CEFAZOLIN SODIUM-DEXTROSE 2-4 GM/100ML-% IV SOLN
2.0000 g | INTRAVENOUS | Status: AC
  Administered 2022-08-19: 2 g via INTRAVENOUS
  Filled 2022-08-19: qty 100

## 2022-08-19 MED ORDER — DEXAMETHASONE SODIUM PHOSPHATE 10 MG/ML IJ SOLN
INTRAMUSCULAR | Status: AC
  Filled 2022-08-19: qty 1

## 2022-08-19 MED ORDER — PROPOFOL 10 MG/ML IV BOLUS
INTRAVENOUS | Status: DC | PRN
  Administered 2022-08-19: 200 mg via INTRAVENOUS

## 2022-08-19 MED ORDER — FENTANYL CITRATE (PF) 250 MCG/5ML IJ SOLN
INTRAMUSCULAR | Status: DC | PRN
  Administered 2022-08-19 (×2): 100 ug via INTRAVENOUS
  Administered 2022-08-19: 50 ug via INTRAVENOUS

## 2022-08-19 MED ORDER — ONDANSETRON HCL 4 MG/2ML IJ SOLN
INTRAMUSCULAR | Status: DC | PRN
  Administered 2022-08-19: 4 mg via INTRAVENOUS

## 2022-08-19 MED ORDER — FENTANYL CITRATE PF 50 MCG/ML IJ SOSY
50.0000 ug | PREFILLED_SYRINGE | INTRAMUSCULAR | Status: DC | PRN
  Administered 2022-08-19: 50 ug via INTRAVENOUS
  Filled 2022-08-19: qty 1

## 2022-08-19 MED ORDER — DOCUSATE SODIUM 100 MG PO CAPS
100.0000 mg | ORAL_CAPSULE | Freq: Two times a day (BID) | ORAL | Status: DC
  Administered 2022-08-20 – 2022-08-23 (×6): 100 mg via ORAL
  Filled 2022-08-19 (×7): qty 1

## 2022-08-19 MED ORDER — HALOPERIDOL LACTATE 5 MG/ML IJ SOLN
INTRAMUSCULAR | Status: AC
  Filled 2022-08-19: qty 2

## 2022-08-19 MED ORDER — OXYCODONE HCL 5 MG/5ML PO SOLN
5.0000 mg | Freq: Once | ORAL | Status: DC | PRN
  Administered 2022-08-19: 5 mg via ORAL

## 2022-08-19 SURGICAL SUPPLY — 50 items
BAG COUNTER SPONGE SURGICOUNT (BAG) ×1 IMPLANT
BIT DRILL 4.3 CALIBRATED (DRILL) IMPLANT
BIT DRILL CALIBRTD FREE HND4.3 (BIT) IMPLANT
BLADE SURG 10 STRL SS (BLADE) ×1 IMPLANT
BNDG COHESIVE 4X5 TAN STRL (GAUZE/BANDAGES/DRESSINGS) ×1 IMPLANT
BNDG ELASTIC 4X5.8 VLCR NS LF (GAUZE/BANDAGES/DRESSINGS) IMPLANT
BNDG ELASTIC 6X5.8 VLCR STR LF (GAUZE/BANDAGES/DRESSINGS) ×1 IMPLANT
BNDG GAUZE DERMACEA FLUFF 4 (GAUZE/BANDAGES/DRESSINGS) ×1 IMPLANT
COVER SURGICAL LIGHT HANDLE (MISCELLANEOUS) ×3 IMPLANT
DRAPE C-ARM 42X72 X-RAY (DRAPES) IMPLANT
DRAPE C-ARMOR (DRAPES) IMPLANT
DRAPE IMP U-DRAPE 54X76 (DRAPES) ×1 IMPLANT
DRAPE U-SHAPE 47X51 STRL (DRAPES) ×1 IMPLANT
DRILL 4.3 CALIBRATED (DRILL) ×1
DRILL CALIBRATED FREE HAND 4.3 (BIT) ×1
DRSG ADAPTIC 3X8 NADH LF (GAUZE/BANDAGES/DRESSINGS) ×1 IMPLANT
ELECT REM PT RETURN 9FT ADLT (ELECTROSURGICAL) ×1
ELECTRODE REM PT RTRN 9FT ADLT (ELECTROSURGICAL) ×1 IMPLANT
GAUZE SPONGE 4X4 12PLY STRL (GAUZE/BANDAGES/DRESSINGS) ×1 IMPLANT
GLOVE BIO SURGEON STRL SZ7.5 (GLOVE) IMPLANT
GOWN STRL REUS W/ TWL LRG LVL3 (GOWN DISPOSABLE) IMPLANT
GOWN STRL REUS W/ TWL XL LVL3 (GOWN DISPOSABLE) ×3 IMPLANT
GOWN STRL REUS W/TWL LRG LVL3 (GOWN DISPOSABLE) ×1
GOWN STRL REUS W/TWL XL LVL3 (GOWN DISPOSABLE) ×1
GUIDEPIN 3.0 THREADED 305MM (PIN) IMPLANT
GUIDEWIRE NATURAL NAIL 3X100 (WIRE) IMPLANT
KIT BASIN OR (CUSTOM PROCEDURE TRAY) ×1 IMPLANT
KIT TURNOVER KIT B (KITS) ×1 IMPLANT
NAIL IM TIB UNIV 9.3X380 (Nail) IMPLANT
NS IRRIG 1000ML POUR BTL (IV SOLUTION) ×1 IMPLANT
PACK ORTHO EXTREMITY (CUSTOM PROCEDURE TRAY) ×1 IMPLANT
PACK UNIVERSAL I (CUSTOM PROCEDURE TRAY) ×1 IMPLANT
PAD ARMBOARD 7.5X6 YLW CONV (MISCELLANEOUS) ×2 IMPLANT
PAD CAST 4YDX4 CTTN HI CHSV (CAST SUPPLIES) ×1 IMPLANT
PADDING CAST COTTON 4X4 STRL (CAST SUPPLIES) ×1
PADDING CAST COTTON 6X4 STRL (CAST SUPPLIES) IMPLANT
PENCIL BUTTON HOLSTER BLD 10FT (ELECTRODE) ×1 IMPLANT
REAMER HEAD TAPER 12.0 (ORTHOPEDIC DISPOSABLE SUPPLIES) IMPLANT
SCREW BONE 5.0X35MM CORTICAL (Screw) IMPLANT
SCREW BONE 5.0X37.5MM CORT Z (Screw) IMPLANT
SCREW CORTI FEM TIB 5.0X52.5 (Screw) IMPLANT
SCREW HEX HEAD 3.5X42.5 (Screw) IMPLANT
STAPLER VISISTAT 35W (STAPLE) ×1 IMPLANT
SUT VIC AB 0 CT1 27 (SUTURE) ×1
SUT VIC AB 0 CT1 27XBRD ANBCTR (SUTURE) ×1 IMPLANT
SUT VIC AB 2-0 CTB1 (SUTURE) ×1 IMPLANT
TOWEL GREEN STERILE (TOWEL DISPOSABLE) ×1 IMPLANT
TOWEL GREEN STERILE FF (TOWEL DISPOSABLE) ×1 IMPLANT
TUBE CONNECTING 12X1/4 (SUCTIONS) ×1 IMPLANT
WATER STERILE IRR 1000ML POUR (IV SOLUTION) ×1 IMPLANT

## 2022-08-19 NOTE — Anesthesia Postprocedure Evaluation (Signed)
Anesthesia Post Note  Patient: John [name]  Procedure(s) Performed: INTRAMEDULLARY (IM) NAIL TIBIAL (Right)     Patient location during evaluation: PACU Anesthesia Type: General Level of consciousness: awake Pain management: pain level controlled Vital Signs Assessment: post-procedure vital signs reviewed and stable Respiratory status: spontaneous breathing, nonlabored ventilation, respiratory function stable and patient connected to nasal cannula oxygen Cardiovascular status: blood pressure returned to baseline and stable Postop Assessment: no apparent nausea or vomiting Anesthetic complications: no  No notable events documented.  Last Vitals:  Vitals:   08/19/22 1600 08/19/22 1700  BP: 129/85 129/70  Pulse: 90 88  Resp: 19 20  Temp:    SpO2: 97% 97%    Last Pain:  Vitals:   08/19/22 1600  TempSrc:   PainSc: Asleep                 Sriya Kroeze P Tivon Lemoine
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
## 2022-08-19 NOTE — Transfer of Care (Signed)
Immediate Anesthesia Transfer of Care Note  Patient: John [name]  Procedure(s) Performed: INTRAMEDULLARY (IM) NAIL TIBIAL (Right)  Patient Location: PACU  Anesthesia Type:General  Level of Consciousness: awake and alert   Airway & Oxygen Therapy: Patient Spontanous Breathing and Patient connected to face mask oxygen  Post-op Assessment: Report given to RN and Post -op Vital signs reviewed and stable  Post vital signs: Reviewed and stable  Last Vitals:  Vitals Value Taken Time  BP    Temp    Pulse 81 08/19/22 1054  Resp 15 08/19/22 1054  SpO2 100 % 08/19/22 1054  Vitals shown include unvalidated device data.  Last Pain:  Vitals:   08/19/22 0800  TempSrc:   PainSc: 10-Worst pain ever         Complications: No notable events documented.

## 2022-08-19 NOTE — Progress Notes (Signed)
Transition of Care [facility]) - CAGE-AID Screening   Patient Details  Name: John [name] MRN: [id number] Date of Birth: Oct 25, 1996  Elvina Sidle, RN Trauma Response Nurse Phone Number: [phone] 08/19/2022, 9:33 AM    CAGE-AID Screening:    Have You Ever Felt You Ought to Cut Down on Your Drinking or Drug Use?: Yes Have People Annoyed You By Critizing Your Drinking Or Drug Use?: Yes Have You Felt Bad Or Guilty About Your Drinking Or Drug Use?: Yes Have You Ever Had a Drink or Used Drugs First Thing In The Morning to Steady Your Nerves or to Get Rid of a Hangover?: Yes CAGE-AID Score: 4  Substance Abuse Education Offered: No (pt with lengthy history of substance abuse- did not want any information at this time-- preparing for OR- will follow up. Has been on Suboxone and in treatment for substance use disorder.)
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
## 2022-08-19 NOTE — Brief Op Note (Signed)
   Brief Op Note  Date of Surgery: 08/19/2022  Preoperative Diagnosis: RIGHT TIBIA FRACTURE  Postoperative Diagnosis: same  Procedure: Procedure(s): INTRAMEDULLARY (IM) NAIL TIBIAL  Implants: Implant Name Type Inv. Item Serial No. Manufacturer Lot No. LRB No. Used Action  TIBIAL NAIL    [employer](ORTH,TRAU,BIO,SG) [id number] Right 1 Implanted  SCREW HEX HEAD 3.5X42.5 - [id number] Screw SCREW HEX HEAD 3.5X42.5  [employer](ORTH,TRAU,BIO,SG) [id number] Right 1 Implanted  SCREW BONE 5.0X37.5MM CORT Z - [id number] Screw SCREW BONE 5.0X37.5MM CORT Z  [employer](ORTH,TRAU,BIO,SG) [id number] Right 1 Implanted  SCREW BONE 5.0X35MM CORTICAL - [id number] Screw SCREW BONE 5.0X35MM CORTICAL  [employer](ORTH,TRAU,BIO,SG) [id number] Right 1 Implanted  SCREW CORTI FEM TIB 5.0X52.5 - [id number] Screw SCREW CORTI FEM TIB 5.0X52.5  [employer](ORTH,TRAU,BIO,SG) [id number] Right 1 Implanted    Surgeons: Surgeon(s): Vanetta Mulders, MD  Anesthesia: General    Estimated Blood Loss: See anesthesia record  Complications: None  Condition to PACU: Stable  Yevonne Pax, MD 08/19/2022 10:44 AM

## 2022-08-19 NOTE — Anesthesia Preprocedure Evaluation (Addendum)
Anesthesia Evaluation  Patient identified by MRN, date of birth, ID band Patient awake    Reviewed: Allergy & Precautions, NPO status , Patient's Chart, lab work & pertinent test results  Airway Mallampati: I  TM Distance: >3 FB Neck ROM: Full    Dental no notable dental hx.    Pulmonary Current Smoker and Patient abstained from smoking.   Pulmonary exam normal        Cardiovascular negative cardio ROS Normal cardiovascular exam     Neuro/Psych  PSYCHIATRIC DISORDERS Anxiety     negative neurological ROS     GI/Hepatic negative GI ROS,,,(+)     substance abuse  , Hepatitis -, C  Endo/Other  negative endocrine ROS    Renal/GU negative Renal ROS     Musculoskeletal   Abdominal   Peds  Hematology  (+) Blood dyscrasia, anemia   Anesthesia Other Findings RIGHT TIBIA FRACTURE  Reproductive/Obstetrics                             Anesthesia Physical Anesthesia Plan  ASA: 3  Anesthesia Plan: General   Post-op Pain Management: Ketamine IV* and Dilaudid IV   Induction: Intravenous  PONV Risk Score and Plan: 1 and Ondansetron, Dexamethasone, Midazolam and Treatment may vary due to age or medical condition  Airway Management Planned: Oral ETT  Additional Equipment:   Intra-op Plan:   Post-operative Plan: Extubation in OR  Informed Consent: I have reviewed the patients History and Physical, chart, labs and discussed the procedure including the risks, benefits and alternatives for the proposed anesthesia with the patient or authorized representative who has indicated his/her understanding and acceptance.     Dental advisory given  Plan Discussed with: CRNA  Anesthesia Plan Comments:         Anesthesia Quick Evaluation

## 2022-08-19 NOTE — Interval H&P Note (Signed)
History and Physical Interval Note:  08/19/2022 8:23 AM  John [name]  has presented today for surgery, with the diagnosis of RIGHT TIBIA FRACTURE.  The various methods of treatment have been discussed with the patient and family. After consideration of risks, benefits and other options for treatment, the patient has consented to  Procedure(s): INTRAMEDULLARY (IM) NAIL TIBIAL (Right) as a surgical intervention.  The patient's history has been reviewed, patient examined, no change in status, stable for surgery.  I have reviewed the patient's chart and labs.  Questions were answered to the patient's satisfaction.     Vanetta Mulders
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
## 2022-08-19 NOTE — Progress Notes (Signed)
PT Cancellation Note  Patient Details Name: John [name] MRN: [id number] DOB: [birth year]   Cancelled Treatment:    Reason Eval/Treat Not Completed: Patient at procedure or test/unavailable Pt currently in OR. Will follow up as schedule allows.   Lou Miner, DPT  Acute Rehabilitation Services  Office: [phone]    [name] 08/19/2022, 9:20 AM

## 2022-08-19 NOTE — Anesthesia Procedure Notes (Signed)
Procedure Name: Intubation Date/Time: 08/19/2022 9:25 AM  Performed by: Lorie Phenix, CRNAPre-anesthesia Checklist: Patient identified, Emergency Drugs available, Suction available and Patient being monitored Patient Re-evaluated:Patient Re-evaluated prior to induction Oxygen Delivery Method: Circle system utilized Preoxygenation: Pre-oxygenation with 100% oxygen Induction Type: IV induction Ventilation: Mask ventilation without difficulty Laryngoscope Size: Mac and 4 Grade View: Grade I Tube type: Oral Tube size: 8.0 mm Number of attempts: 1 Airway Equipment and Method: Stylet Placement Confirmation: ETT inserted through vocal cords under direct vision, positive ETCO2 and breath sounds checked- equal and bilateral Secured at: 24 cm Tube secured with: Tape Dental Injury: Teeth and Oropharynx as per pre-operative assessment

## 2022-08-19 NOTE — Discharge Instructions (Signed)
     Discharge Instructions    Attending Surgeon: Vanetta Mulders, MD Office Phone Number: [phone]   Diagnosis and Procedures:    Surgeries Performed: Right tibial nail  Discharge Plan:    Diet: Resume usual diet. Begin with light or bland foods.  Drink plenty of fluids.  Activity:  You may be activity as tolerated and put weight as tolerated on all of your extremities. You are advised to go home directly from the hospital or surgical center. Restrict your activities.  GENERAL INSTRUCTIONS: 1.  Please apply ice to your wound to help with swelling and inflammation. This will improve your comfort and your overall recovery following surgery.     2. Please call Dr. Eddie Dibbles office at [phone] with questions Monday-Friday during business hours. If no one answers, please leave a message and someone should get back to the patient within 24 hours. For emergencies please call 911 or proceed to the emergency room.   3. Patient to notify surgical team if experiences any of the following: Bowel/Bladder dysfunction, uncontrolled pain, nerve/muscle weakness, incision with increased drainage or redness, nausea/vomiting and Fever greater than 101.0 F.  Be alert for signs of infection including redness, streaking, odor, fever or chills. Be alert for excessive pain or bleeding and notify your surgeon immediately.  WOUND INSTRUCTIONS:   Leave your dressing, cast, or splint in place until your post operative visit.  Keep it clean and dry.  Always keep the incision clean and dry until the staples/sutures are removed. If there is no drainage from the incision you should keep it open to air. If there is drainage from the incision you must keep it covered at all times until the drainage stops  Do not soak in a bath tub, hot tub, pool, lake or other body of water until 21 days after your surgery and your incision is completely dry and healed.  If you have removable sutures (or staples) they  must be removed 10-14 days (unless otherwise instructed) from the day of your surgery.     1)  Elevate the extremity as much as possible.  2)  Keep the dressing clean and dry.  3)  Please call us if the dressing becomes wet or dirty.  4)  If you are experiencing worsening pain or worsening swelling, please call.

## 2022-08-19 NOTE — Plan of Care (Signed)
I spoke with his father John [name] this morning who is in agreement with performing the procedure for the right tibial repair in addition.

## 2022-08-20 DIAGNOSIS — F1199 Opioid use, unspecified with unspecified opioid-induced disorder: Secondary | ICD-10-CM

## 2022-08-20 LAB — CBC
HCT: 30.4 % — ABNORMAL LOW (ref 39.0–52.0)
HCT: 30.5 % — ABNORMAL LOW (ref 39.0–52.0)
HCT: 29.4 % — ABNORMAL LOW (ref 39.0–52.0)
Hemoglobin: 10.4 g/dL — ABNORMAL LOW (ref 13.0–17.0)
Hemoglobin: 10.6 g/dL — ABNORMAL LOW (ref 13.0–17.0)
Hemoglobin: 10.4 g/dL — ABNORMAL LOW (ref 13.0–17.0)
MCH: 28.7 pg (ref 26.0–34.0)
MCH: 28.5 pg (ref 26.0–34.0)
MCH: 28.8 pg (ref 26.0–34.0)
MCHC: 34.2 g/dL (ref 30.0–36.0)
MCHC: 34.8 g/dL (ref 30.0–36.0)
MCHC: 35.4 g/dL (ref 30.0–36.0)
MCV: 83.7 fL (ref 80.0–100.0)
MCV: 82 fL (ref 80.0–100.0)
MCV: 81.4 fL (ref 80.0–100.0)
Platelets: 137 10*3/uL — ABNORMAL LOW (ref 150–400)
Platelets: 138 10*3/uL — ABNORMAL LOW (ref 150–400)
Platelets: 135 10*3/uL — ABNORMAL LOW (ref 150–400)
RBC: 3.63 MIL/uL — ABNORMAL LOW (ref 4.22–5.81)
RBC: 3.72 MIL/uL — ABNORMAL LOW (ref 4.22–5.81)
RBC: 3.61 MIL/uL — ABNORMAL LOW (ref 4.22–5.81)
RDW: 13.2 % (ref 11.5–15.5)
RDW: 13.1 % (ref 11.5–15.5)
RDW: 13.2 % (ref 11.5–15.5)
WBC: 9.3 10*3/uL (ref 4.0–10.5)
WBC: 9.3 10*3/uL (ref 4.0–10.5)
WBC: 8 10*3/uL (ref 4.0–10.5)
nRBC: 0 % (ref 0.0–0.2)
nRBC: 0 % (ref 0.0–0.2)
nRBC: 0 % (ref 0.0–0.2)

## 2022-08-20 LAB — COMPREHENSIVE METABOLIC PANEL
ALT: 342 U/L — ABNORMAL HIGH (ref 0–44)
AST: 155 U/L — ABNORMAL HIGH (ref 15–41)
Albumin: 3.2 g/dL — ABNORMAL LOW (ref 3.5–5.0)
Alkaline Phosphatase: 53 U/L (ref 38–126)
Anion gap: 8 (ref 5–15)
BUN: 14 mg/dL (ref 6–20)
CO2: 22 mmol/L (ref 22–32)
Calcium: 8.1 mg/dL — ABNORMAL LOW (ref 8.9–10.3)
Chloride: 104 mmol/L (ref 98–111)
Creatinine, Ser: 0.9 mg/dL (ref 0.61–1.24)
GFR, Estimated: >60 mL/min (ref >60)
Glucose, Bld: 109 mg/dL — ABNORMAL HIGH (ref 70–99)
Potassium: 3.6 mmol/L (ref 3.5–5.1)
Sodium: 134 mmol/L — ABNORMAL LOW (ref 135–145)
Total Bilirubin: 1 mg/dL (ref 0.3–1.2)
Total Protein: 5.9 g/dL — ABNORMAL LOW (ref 6.5–8.1)

## 2022-08-20 MED ORDER — HALOPERIDOL LACTATE 5 MG/ML IJ SOLN: 10.0000 mg | Freq: Four times a day (QID) | INTRAMUSCULAR | Status: DC | PRN

## 2022-08-20 MED ORDER — PROMETHAZINE HCL 25 MG PO TABS: 25.0000 mg | ORAL_TABLET | Freq: Four times a day (QID) | ORAL | Status: DC | PRN

## 2022-08-20 MED ORDER — METHOCARBAMOL 500 MG PO TABS
1000.0000 mg | ORAL_TABLET | Freq: Three times a day (TID) | ORAL | Status: DC
  Administered 2022-08-20 – 2022-08-23 (×11): 1000 mg via ORAL
  Filled 2022-08-20 (×11): qty 2

## 2022-08-20 MED ORDER — TRAZODONE HCL 50 MG PO TABS
50.0000 mg | ORAL_TABLET | Freq: Every evening | ORAL | Status: DC | PRN
  Administered 2022-08-20 – 2022-08-22 (×2): 50 mg via ORAL
  Filled 2022-08-20 (×2): qty 1

## 2022-08-20 MED ORDER — GABAPENTIN 300 MG PO CAPS
300.0000 mg | ORAL_CAPSULE | Freq: Three times a day (TID) | ORAL | Status: DC
  Administered 2022-08-20 – 2022-08-23 (×11): 300 mg via ORAL
  Filled 2022-08-20 (×11): qty 1

## 2022-08-20 MED ORDER — KETOROLAC TROMETHAMINE 15 MG/ML IJ SOLN
30.0000 mg | Freq: Four times a day (QID) | INTRAMUSCULAR | Status: DC
  Administered 2022-08-20 – 2022-08-23 (×13): 30 mg via INTRAVENOUS
  Filled 2022-08-20 (×13): qty 2

## 2022-08-20 MED ORDER — POLYETHYLENE GLYCOL 3350 17 G PO PACK
17.0000 g | PACK | Freq: Every day | ORAL | Status: DC
  Administered 2022-08-22 – 2022-08-23 (×2): 17 g via ORAL
  Filled 2022-08-20 (×3): qty 1

## 2022-08-20 MED ORDER — PROMETHAZINE HCL 25 MG RE SUPP: 25.0000 mg | Freq: Four times a day (QID) | RECTAL | Status: DC | PRN

## 2022-08-20 MED ORDER — ACETAMINOPHEN 500 MG PO TABS
1000.0000 mg | ORAL_TABLET | Freq: Four times a day (QID) | ORAL | Status: DC
  Administered 2022-08-20 – 2022-08-23 (×13): 1000 mg via ORAL
  Filled 2022-08-20 (×14): qty 2

## 2022-08-20 MED ORDER — OXYCODONE HCL 5 MG/5ML PO SOLN
5.0000 mg | ORAL | Status: DC | PRN
  Administered 2022-08-21 – 2022-08-23 (×7): 10 mg via ORAL
  Filled 2022-08-20 (×7): qty 10

## 2022-08-20 MED ORDER — SODIUM CHLORIDE 0.9 % IV SOLN
25.0000 mg | Freq: Four times a day (QID) | INTRAVENOUS | Status: DC | PRN
  Administered 2022-08-21: 25 mg via INTRAVENOUS
  Filled 2022-08-20: qty 1

## 2022-08-20 MED ORDER — SODIUM CHLORIDE 0.9 % IV SOLN: INTRAVENOUS | Status: DC

## 2022-08-20 MED ORDER — HALOPERIDOL 5 MG PO TABS: 10.0000 mg | ORAL_TABLET | Freq: Four times a day (QID) | ORAL | Status: DC | PRN
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
## 2022-08-20 NOTE — Progress Notes (Signed)
   Subjective:  Significant pain overnight, controlled with medication. Tolerating diet, voiding. Complains of mild pain in right elbow.  Objective:   VITALS:   Vitals:   08/20/22 0300 08/20/22 0400 08/20/22 0500 08/20/22 0520  BP: 133/84 116/77 127/82   Pulse: 82 73 61   Resp:      Temp:    98.7 F (37.1 C)  TempSrc:    Oral  SpO2: 96% 95% 96%   Weight:      Height:        Right elbow with full pro/supination and flexion extension, TTP over radial head  Right leg in boot/dressing. CDI. SILT all distributions of toes. Fires EHL/TA/GS   Lab Results  Component Value Date   WBC 9.3 08/20/2022   HGB 10.4 (L) 08/20/2022   HCT 30.4 (L) 08/20/2022   MCV 83.7 08/20/2022   PLT 137 (L) 08/20/2022     Assessment/Plan:  1 Day Post-Op right tibial nail, with nonoperative right radial head and acetabular fractres  - Expected postop acute blood loss anemia - will monitor for symptoms - Patient to work with PT - DVT ppx - SCDs, ambulation, Aspirin 325 daily - WBAT operative extremity, activity as tolerated right leg and right arm   Shirlene Andaya 08/20/2022, 7:16 AM

## 2022-08-20 NOTE — Plan of Care (Signed)
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
## 2022-08-21 ENCOUNTER — Inpatient Hospital Stay (HOSPITAL_COMMUNITY): Payer: Self-pay

## 2022-08-21 DIAGNOSIS — F1910 Other psychoactive substance abuse, uncomplicated: Secondary | ICD-10-CM | POA: Diagnosis not present

## 2022-08-21 LAB — CBC
HCT: 31 % — ABNORMAL LOW (ref 39.0–52.0)
HCT: 30.8 % — ABNORMAL LOW (ref 39.0–52.0)
HCT: 29.1 % — ABNORMAL LOW (ref 39.0–52.0)
Hemoglobin: 10.7 g/dL — ABNORMAL LOW (ref 13.0–17.0)
Hemoglobin: 10.5 g/dL — ABNORMAL LOW (ref 13.0–17.0)
Hemoglobin: 10.1 g/dL — ABNORMAL LOW (ref 13.0–17.0)
MCH: 28.9 pg (ref 26.0–34.0)
MCH: 28.5 pg (ref 26.0–34.0)
MCH: 29.1 pg (ref 26.0–34.0)
MCHC: 34.5 g/dL (ref 30.0–36.0)
MCHC: 34.1 g/dL (ref 30.0–36.0)
MCHC: 34.7 g/dL (ref 30.0–36.0)
MCV: 83.8 fL (ref 80.0–100.0)
MCV: 83.7 fL (ref 80.0–100.0)
MCV: 83.9 fL (ref 80.0–100.0)
Platelets: 142 10*3/uL — ABNORMAL LOW (ref 150–400)
Platelets: 153 10*3/uL (ref 150–400)
Platelets: 149 10*3/uL — ABNORMAL LOW (ref 150–400)
RBC: 3.7 MIL/uL — ABNORMAL LOW (ref 4.22–5.81)
RBC: 3.68 MIL/uL — ABNORMAL LOW (ref 4.22–5.81)
RBC: 3.47 MIL/uL — ABNORMAL LOW (ref 4.22–5.81)
RDW: 13.2 % (ref 11.5–15.5)
RDW: 13.2 % (ref 11.5–15.5)
RDW: 13.2 % (ref 11.5–15.5)
WBC: 9.1 10*3/uL (ref 4.0–10.5)
WBC: 9 10*3/uL (ref 4.0–10.5)
WBC: 8.8 10*3/uL (ref 4.0–10.5)
nRBC: 0 % (ref 0.0–0.2)
nRBC: 0 % (ref 0.0–0.2)
nRBC: 0 % (ref 0.0–0.2)

## 2022-08-21 MED ORDER — NICOTINE 14 MG/24HR TD PT24
14.0000 mg | MEDICATED_PATCH | Freq: Every day | TRANSDERMAL | Status: DC
  Administered 2022-08-21 – 2022-08-23 (×3): 14 mg via TRANSDERMAL
  Filled 2022-08-21 (×3): qty 1

## 2022-08-21 NOTE — Progress Notes (Signed)
Pt second occurrence vomiting . MD informed iv Phenergan as per MD orders given. Will continue to monitor.
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
## 2022-08-21 NOTE — Progress Notes (Signed)
   Subjective:  Significant pain overnight, controlled with medication. Tolerating diet, voiding. Seen by psychiatry.  Objective:   VITALS:   Vitals:   08/20/22 1545 08/20/22 2003 08/21/22 0006 08/21/22 0420  BP: (!) 116/95 126/82 137/78 135/84  Pulse: 64 82 74 (!) 54  Resp: 17 18 18 16   Temp: 97.8 F (36.6 C) 98.4 F (36.9 C) 97.8 F (36.6 C) 98.5 F (36.9 C)  TempSrc: Oral Oral Oral Oral  SpO2: 100% 100% 97% 99%  Weight:      Height:        Right elbow with full pro/supination and flexion extension, TTP over radial head  Right leg in boot/dressing. CDI. SILT all distributions of toes. Fires EHL/TA/GS   Lab Results  Component Value Date   WBC 8.0 08/20/2022   HGB 10.4 (L) 08/20/2022   HCT 29.4 (L) 08/20/2022   MCV 81.4 08/20/2022   PLT 135 (L) 08/20/2022     Assessment/Plan:  2 Days Post-Op right tibial nail, with nonoperative right radial head and acetabular fractres  - Expected postop acute blood loss anemia - will monitor for symptoms - Patient to work with PT - DVT ppx - SCDs, ambulation, Aspirin 325 daily - WBAT operative extremity, activity as tolerated right leg and right arm   Arcola Freshour 08/21/2022, 6:40 AM

## 2022-08-22 ENCOUNTER — Encounter (HOSPITAL_COMMUNITY): Payer: Self-pay | Admitting: Orthopaedic Surgery

## 2022-08-22 LAB — COMPREHENSIVE METABOLIC PANEL
ALT: 211 U/L — ABNORMAL HIGH (ref 0–44)
AST: 74 U/L — ABNORMAL HIGH (ref 15–41)
Albumin: 2.7 g/dL — ABNORMAL LOW (ref 3.5–5.0)
Alkaline Phosphatase: 56 U/L (ref 38–126)
Anion gap: 6 (ref 5–15)
BUN: 13 mg/dL (ref 6–20)
CO2: 25 mmol/L (ref 22–32)
Calcium: 8.1 mg/dL — ABNORMAL LOW (ref 8.9–10.3)
Chloride: 105 mmol/L (ref 98–111)
Creatinine, Ser: 0.92 mg/dL (ref 0.61–1.24)
GFR, Estimated: >60 mL/min (ref >60)
Glucose, Bld: 92 mg/dL (ref 70–99)
Potassium: 4 mmol/L (ref 3.5–5.1)
Sodium: 136 mmol/L (ref 135–145)
Total Bilirubin: 0.8 mg/dL (ref 0.3–1.2)
Total Protein: 5.6 g/dL — ABNORMAL LOW (ref 6.5–8.1)
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
## 2022-08-22 NOTE — Progress Notes (Signed)
   Subjective:  Pain improved. Worked with PT to mobilize. Objective:   VITALS:   Vitals:   08/21/22 0816 08/21/22 1715 08/21/22 2033 08/22/22 0524  BP: 139/89 128/79 120/69 130/82  Pulse: 72 60 83 (!) 56  Resp: 18 18 17 18   Temp: 98 F (36.7 C) 97.7 F (36.5 C) 98.2 F (36.8 C) 98.9 F (37.2 C)  TempSrc: Oral Oral Oral Oral  SpO2: 99% 100% 99% 100%  Weight:      Height:        Right elbow with full pro/supination and flexion extension, TTP over radial head  Right leg in boot/dressing. CDI. SILT all distributions of toes. Fires EHL/TA/GS   Lab Results  Component Value Date   WBC 8.8 08/21/2022   HGB 10.1 (L) 08/21/2022   HCT 29.1 (L) 08/21/2022   MCV 83.9 08/21/2022   PLT 149 (L) 08/21/2022     Assessment/Plan:  3 Days Post-Op right tibial nail, with nonoperative right radial head and acetabular fractres  - Expected postop acute blood loss anemia - will monitor for symptoms - Patient to work with PT - DVT ppx - SCDs, ambulation, Aspirin 325 daily - WBAT operative extremity, activity as tolerated right leg and right arm   John [name] 08/22/2022, 6:50 AM

## 2022-08-23 ENCOUNTER — Other Ambulatory Visit (HOSPITAL_COMMUNITY): Payer: Self-pay

## 2022-08-23 MED ORDER — ASPIRIN 325 MG PO TABS
325.0000 mg | ORAL_TABLET | Freq: Every day | ORAL | 0 refills | Status: AC
  Filled 2022-08-23: qty 14, 14d supply, fill #0

## 2022-08-23 MED ORDER — POLYETHYLENE GLYCOL 3350 17 G PO PACK: 17.0000 g | PACK | Freq: Every day | ORAL | 0 refills | Status: DC | PRN

## 2022-08-23 MED ORDER — METHOCARBAMOL 500 MG PO TABS
1000.0000 mg | ORAL_TABLET | Freq: Three times a day (TID) | ORAL | 0 refills | Status: AC | PRN
  Filled 2022-08-23: qty 84, 14d supply, fill #0

## 2022-08-23 MED ORDER — HYDROMORPHONE HCL 1 MG/ML IJ SOLN: 1.0000 mg | INTRAMUSCULAR | Status: DC | PRN

## 2022-08-23 MED ORDER — OXYCODONE HCL 5 MG PO TABS
10.0000 mg | ORAL_TABLET | Freq: Four times a day (QID) | ORAL | 0 refills | Status: AC | PRN
  Filled 2022-08-23: qty 40, 5d supply, fill #0

## 2022-08-23 MED ORDER — DOCUSATE SODIUM 100 MG PO CAPS: 100.0000 mg | ORAL_CAPSULE | Freq: Two times a day (BID) | ORAL | 0 refills | Status: DC | PRN

## 2022-08-23 MED ORDER — ACETAMINOPHEN 500 MG PO TABS: 1000.0000 mg | ORAL_TABLET | Freq: Four times a day (QID) | ORAL | 0 refills | Status: DC | PRN

## 2022-08-23 MED ORDER — OXYCODONE HCL 5 MG PO TABS
5.0000 mg | ORAL_TABLET | ORAL | Status: DC | PRN
  Administered 2022-08-23 (×2): 10 mg via ORAL
  Filled 2022-08-23 (×2): qty 2
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
## 2022-08-23 NOTE — Progress Notes (Addendum)
Discharge instructions given with dad at bedside. Pt transported off unit via [place] with all belongings including TOC meds and DME. He remains alert and stable at baseline.

## 2022-08-24 ENCOUNTER — Other Ambulatory Visit (HOSPITAL_BASED_OUTPATIENT_CLINIC_OR_DEPARTMENT_OTHER): Payer: Self-pay

## 2022-08-24 ENCOUNTER — Ambulatory Visit (INDEPENDENT_AMBULATORY_CARE_PROVIDER_SITE_OTHER): Payer: Self-pay | Admitting: Orthopaedic Surgery

## 2022-08-24 DIAGNOSIS — S82251A Displaced comminuted fracture of shaft of right tibia, initial encounter for closed fracture: Secondary | ICD-10-CM

## 2022-08-24 MED ORDER — ACETAMINOPHEN 500 MG PO TABS
500.0000 mg | ORAL_TABLET | Freq: Three times a day (TID) | ORAL | 0 refills | Status: AC
  Filled 2022-08-24: qty 30, 10d supply, fill #0

## 2022-08-24 MED ORDER — OXYCODONE HCL 5 MG PO TABS
10.0000 mg | ORAL_TABLET | ORAL | 0 refills | Status: DC | PRN
  Filled 2022-08-24: qty 20, 2d supply, fill #0

## 2022-08-24 MED ORDER — IBUPROFEN 800 MG PO TABS
800.0000 mg | ORAL_TABLET | Freq: Three times a day (TID) | ORAL | 0 refills | Status: AC
  Filled 2022-08-24: qty 30, 10d supply, fill #0
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
## 2022-08-25 ENCOUNTER — Telehealth: Payer: Self-pay | Admitting: Orthopaedic Surgery

## 2022-08-25 NOTE — Telephone Encounter (Signed)
Pt's father Darrick Meigs called about a referral being sent to pain management. No referral in chart Please send referral and call pt's father with info. Phone number is [phone]

## 2022-08-28 ENCOUNTER — Other Ambulatory Visit (HOSPITAL_BASED_OUTPATIENT_CLINIC_OR_DEPARTMENT_OTHER): Payer: Self-pay | Admitting: Orthopaedic Surgery

## 2022-08-28 DIAGNOSIS — S82251A Displaced comminuted fracture of shaft of right tibia, initial encounter for closed fracture: Secondary | ICD-10-CM

## 2022-08-28 NOTE — Telephone Encounter (Signed)
Referral placed in chart to CPR

## 2022-08-31 ENCOUNTER — Ambulatory Visit (INDEPENDENT_AMBULATORY_CARE_PROVIDER_SITE_OTHER): Payer: Self-pay

## 2022-08-31 ENCOUNTER — Ambulatory Visit (INDEPENDENT_AMBULATORY_CARE_PROVIDER_SITE_OTHER): Payer: Self-pay | Admitting: Orthopaedic Surgery

## 2022-08-31 DIAGNOSIS — S82251A Displaced comminuted fracture of shaft of right tibia, initial encounter for closed fracture: Secondary | ICD-10-CM
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
## 2022-09-02 ENCOUNTER — Encounter: Payer: Self-pay | Admitting: Physical Therapy

## 2022-09-02 ENCOUNTER — Other Ambulatory Visit: Payer: Self-pay

## 2022-09-02 ENCOUNTER — Ambulatory Visit: Payer: Self-pay | Attending: General Surgery | Admitting: Physical Therapy

## 2022-09-02 DIAGNOSIS — M6281 Muscle weakness (generalized): Secondary | ICD-10-CM | POA: Diagnosis present

## 2022-09-02 DIAGNOSIS — M25561 Pain in right knee: Secondary | ICD-10-CM | POA: Diagnosis not present

## 2022-09-02 DIAGNOSIS — S36039A Unspecified laceration of spleen, initial encounter: Secondary | ICD-10-CM | POA: Insufficient documentation

## 2022-09-02 DIAGNOSIS — R2689 Other abnormalities of gait and mobility: Secondary | ICD-10-CM | POA: Diagnosis present

## 2022-09-02 DIAGNOSIS — R600 Localized edema: Secondary | ICD-10-CM | POA: Diagnosis present

## 2022-09-02 DIAGNOSIS — M25571 Pain in right ankle and joints of right foot: Secondary | ICD-10-CM | POA: Insufficient documentation
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
## 2022-09-05 ENCOUNTER — Ambulatory Visit (HOSPITAL_COMMUNITY): Payer: Federal, State, Local not specified - Other | Admitting: Psychiatry

## 2022-09-06 ENCOUNTER — Encounter: Payer: Self-pay | Admitting: Physical Therapy

## 2022-09-06 ENCOUNTER — Ambulatory Visit: Payer: Medicaid Other | Admitting: Physical Therapy

## 2022-09-06 DIAGNOSIS — M25561 Pain in right knee: Secondary | ICD-10-CM

## 2022-09-06 DIAGNOSIS — R600 Localized edema: Secondary | ICD-10-CM

## 2022-09-06 DIAGNOSIS — M6281 Muscle weakness (generalized): Secondary | ICD-10-CM

## 2022-09-06 DIAGNOSIS — R2689 Other abnormalities of gait and mobility: Secondary | ICD-10-CM

## 2022-09-06 DIAGNOSIS — M25571 Pain in right ankle and joints of right foot: Secondary | ICD-10-CM
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
## 2022-09-07 ENCOUNTER — Ambulatory Visit: Payer: Self-pay

## 2022-09-12 ENCOUNTER — Ambulatory Visit: Payer: Medicaid Other | Admitting: Physical Therapy

## 2022-09-12 ENCOUNTER — Encounter: Payer: Self-pay | Admitting: Physical Therapy

## 2022-09-12 DIAGNOSIS — R600 Localized edema: Secondary | ICD-10-CM

## 2022-09-12 DIAGNOSIS — M25561 Pain in right knee: Secondary | ICD-10-CM

## 2022-09-12 DIAGNOSIS — R2689 Other abnormalities of gait and mobility: Secondary | ICD-10-CM

## 2022-09-12 DIAGNOSIS — M6281 Muscle weakness (generalized): Secondary | ICD-10-CM

## 2022-09-12 DIAGNOSIS — M25571 Pain in right ankle and joints of right foot: Secondary | ICD-10-CM
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
## 2022-09-13 ENCOUNTER — Other Ambulatory Visit (HOSPITAL_COMMUNITY): Payer: Self-pay

## 2022-09-14 ENCOUNTER — Ambulatory Visit: Payer: Self-pay | Attending: General Surgery

## 2022-09-14 DIAGNOSIS — M25561 Pain in right knee: Secondary | ICD-10-CM | POA: Insufficient documentation

## 2022-09-14 DIAGNOSIS — M6281 Muscle weakness (generalized): Secondary | ICD-10-CM | POA: Insufficient documentation

## 2022-09-14 DIAGNOSIS — R2689 Other abnormalities of gait and mobility: Secondary | ICD-10-CM | POA: Diagnosis present

## 2022-09-14 DIAGNOSIS — M25571 Pain in right ankle and joints of right foot: Secondary | ICD-10-CM | POA: Diagnosis present

## 2022-09-14 DIAGNOSIS — R600 Localized edema: Secondary | ICD-10-CM | POA: Diagnosis present
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
## 2022-09-19 ENCOUNTER — Ambulatory Visit: Payer: Self-pay

## 2022-09-19 DIAGNOSIS — M25561 Pain in right knee: Secondary | ICD-10-CM | POA: Diagnosis not present

## 2022-09-19 DIAGNOSIS — M25571 Pain in right ankle and joints of right foot: Secondary | ICD-10-CM

## 2022-09-19 DIAGNOSIS — R2689 Other abnormalities of gait and mobility: Secondary | ICD-10-CM

## 2022-09-19 DIAGNOSIS — R600 Localized edema: Secondary | ICD-10-CM

## 2022-09-19 DIAGNOSIS — M6281 Muscle weakness (generalized): Secondary | ICD-10-CM
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
## 2022-09-21 ENCOUNTER — Ambulatory Visit: Payer: Self-pay | Admitting: Physical Therapy

## 2022-09-21 ENCOUNTER — Encounter: Payer: Self-pay | Admitting: Physical Therapy

## 2022-09-21 DIAGNOSIS — M25561 Pain in right knee: Secondary | ICD-10-CM

## 2022-09-21 DIAGNOSIS — R2689 Other abnormalities of gait and mobility: Secondary | ICD-10-CM

## 2022-09-21 DIAGNOSIS — M6281 Muscle weakness (generalized): Secondary | ICD-10-CM

## 2022-09-21 DIAGNOSIS — M25571 Pain in right ankle and joints of right foot: Secondary | ICD-10-CM

## 2022-09-21 DIAGNOSIS — R600 Localized edema: Secondary | ICD-10-CM
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
## 2022-09-27 ENCOUNTER — Ambulatory Visit: Payer: Medicaid Other | Admitting: Physical Therapy

## 2022-09-27 ENCOUNTER — Encounter: Payer: Self-pay | Admitting: Physical Therapy

## 2022-09-27 DIAGNOSIS — R2689 Other abnormalities of gait and mobility: Secondary | ICD-10-CM

## 2022-09-27 DIAGNOSIS — M6281 Muscle weakness (generalized): Secondary | ICD-10-CM

## 2022-09-27 DIAGNOSIS — R600 Localized edema: Secondary | ICD-10-CM

## 2022-09-27 DIAGNOSIS — M25561 Pain in right knee: Secondary | ICD-10-CM | POA: Diagnosis not present

## 2022-09-27 DIAGNOSIS — M25571 Pain in right ankle and joints of right foot: Secondary | ICD-10-CM
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
## 2022-09-28 ENCOUNTER — Ambulatory Visit (INDEPENDENT_AMBULATORY_CARE_PROVIDER_SITE_OTHER): Payer: Self-pay

## 2022-09-28 ENCOUNTER — Ambulatory Visit (INDEPENDENT_AMBULATORY_CARE_PROVIDER_SITE_OTHER): Payer: Self-pay | Admitting: Orthopaedic Surgery

## 2022-09-28 DIAGNOSIS — S82251A Displaced comminuted fracture of shaft of right tibia, initial encounter for closed fracture: Secondary | ICD-10-CM
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
## 2022-09-29 ENCOUNTER — Ambulatory Visit: Payer: Self-pay | Admitting: Physical Therapy

## 2022-10-04 ENCOUNTER — Ambulatory Visit: Payer: Self-pay | Admitting: Physical Therapy

## 2022-10-13 ENCOUNTER — Ambulatory Visit: Payer: Medicaid Other | Admitting: Physical Therapy

## 2022-10-17 ENCOUNTER — Ambulatory Visit: Payer: Medicaid Other | Admitting: Physical Therapy

## 2022-10-18 ENCOUNTER — Encounter: Payer: Self-pay | Admitting: Physical Therapy

## 2022-10-18 ENCOUNTER — Ambulatory Visit: Payer: Medicaid Other | Attending: General Surgery | Admitting: Physical Therapy

## 2022-10-18 DIAGNOSIS — M6281 Muscle weakness (generalized): Secondary | ICD-10-CM | POA: Diagnosis present

## 2022-10-18 DIAGNOSIS — R2689 Other abnormalities of gait and mobility: Secondary | ICD-10-CM | POA: Insufficient documentation

## 2022-10-18 DIAGNOSIS — R600 Localized edema: Secondary | ICD-10-CM | POA: Diagnosis present

## 2022-10-18 DIAGNOSIS — M25571 Pain in right ankle and joints of right foot: Secondary | ICD-10-CM | POA: Diagnosis present

## 2022-10-18 DIAGNOSIS — M25561 Pain in right knee: Secondary | ICD-10-CM | POA: Insufficient documentation
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
## 2022-10-24 ENCOUNTER — Ambulatory Visit: Payer: Medicaid Other | Admitting: Physical Therapy

## 2022-10-31 ENCOUNTER — Ambulatory Visit: Payer: Medicaid Other | Admitting: Physical Therapy

## 2022-11-02 ENCOUNTER — Ambulatory Visit: Payer: Medicaid Other | Admitting: Physical Therapy

## 2022-11-08 ENCOUNTER — Encounter (HOSPITAL_BASED_OUTPATIENT_CLINIC_OR_DEPARTMENT_OTHER): Payer: Medicaid Other | Admitting: Orthopaedic Surgery

## 2022-11-14 ENCOUNTER — Ambulatory Visit: Payer: Self-pay | Attending: General Surgery | Admitting: Physical Therapy

## 2022-11-17 ENCOUNTER — Encounter (HOSPITAL_BASED_OUTPATIENT_CLINIC_OR_DEPARTMENT_OTHER): Payer: Medicaid Other | Admitting: Orthopaedic Surgery

## 2022-11-24 ENCOUNTER — Ambulatory Visit (INDEPENDENT_AMBULATORY_CARE_PROVIDER_SITE_OTHER): Payer: Medicaid Other

## 2022-11-24 ENCOUNTER — Ambulatory Visit (INDEPENDENT_AMBULATORY_CARE_PROVIDER_SITE_OTHER): Payer: Medicaid Other | Admitting: Orthopaedic Surgery

## 2022-11-24 DIAGNOSIS — S82251A Displaced comminuted fracture of shaft of right tibia, initial encounter for closed fracture: Secondary | ICD-10-CM

## 2022-11-24 NOTE — Progress Notes (Signed)
Chief Complaint: Status post right tibial nailing 08/19/22     History of Present Illness:   11/24/2022: Presents today for follow-up.  Overall he is still having some pain at the fracture site.  He is becoming more mobile and is walking without any assistive devices.  He is continuing to smoke at this time.   Surgical History:     PMH/PSH/Family History/Social History/Meds/Allergies:    Past Medical History:  Diagnosis Date   Acne nodule    on going   Aggressive behavior 04/13/2019   Hepatitis C    OD (overdose of drug), intentional self-harm, initial encounter ([place]) 04/13/2019   Substance abuse ([place]) 04/13/2019   Suicide attempt ([place]) 04/13/2019   Past Surgical History:  Procedure Laterality Date   ORIF FINGER / THUMB FRACTURE     TIBIA IM NAIL INSERTION Right 08/19/2022   Procedure: INTRAMEDULLARY (IM) NAIL TIBIAL;  Surgeon: Vanetta Mulders, MD;  Location: [place];  Service: Orthopedics;  Laterality: Right;   Social History   Socioeconomic History   Marital status: Single    Spouse name: Not on file   Number of children: Not on file   Years of education: Not on file   Highest education level: Not on file  Occupational History   Not on file  Tobacco Use   Smoking status: Every Day    Packs/day: 1.00    Years: 5.00    Total pack years: 5.00    Types: Cigarettes   Smokeless tobacco: Never  Vaping Use   Vaping Use: Never used  Substance and Sexual Activity   Alcohol use: Not Currently    Alcohol/week: 6.0 standard drinks of alcohol    Types: 6 Shots of liquor per week    Comment: history of heavy alcohol use; trying to cut back 09/28/20; no etoh 09/10/20. no etoh 03/23/21.   Drug use: Not Currently    Types: Marijuana, Oxycodone, Methamphetamines    Comment: heroin; denied 11/24/20, denied 03/23/21   Sexual activity: Yes    Birth control/protection: Condom  Other Topics Concern   Not on file  Social History Narrative   Not on  file   Social Determinants of Health   Financial Resource Strain: Not on file  Food Insecurity: Not on file  Transportation Needs: Not on file  Physical Activity: Not on file  Stress: Not on file  Social Connections: Not on file   Family History  Problem Relation Age of Onset   Bipolar disorder Mother    Liver disease Maternal Grandfather        etoh   Colon cancer Neg Hx    Allergies  Allergen Reactions   Benzonatate Other (See Comments)    Chest pain  Chest pain   Current Outpatient Medications  Medication Sig Dispense Refill   acetaminophen (TYLENOL) 500 MG tablet Take 2 tablets (1,000 mg total) by mouth every 6 (six) hours as needed for mild pain. 30 tablet 0   docusate sodium (COLACE) 100 MG capsule Take 1 capsule (100 mg total) by mouth 2 (two) times daily as needed for mild constipation or moderate constipation. 10 capsule 0   oxyCODONE (OXY IR/ROXICODONE) 5 MG immediate release tablet Take 2 tablets (10 mg total) by mouth every 4 (four) hours as needed. 20 tablet 0   polyethylene glycol (MIRALAX /  GLYCOLAX) 17 g packet Take 17 g by mouth daily as needed for moderate constipation. 14 each 0   No current facility-administered medications for this visit.   No results found.  Review of Systems:   A ROS was performed including pertinent positives and negatives as documented in the HPI.  Physical Exam :   Constitutional: NAD and appears stated age Neurological: Alert and oriented Psych: Appropriate affect and cooperative There were no vitals taken for this visit.   Comprehensive Musculoskeletal Exam:    Right knee incisions are healed.  Range of motion of the right knee is from 0 to 120 degrees without pain.  Wounds over his right leg are well-appearing.  There is some tenderness and soreness about the fracture site distal neurosensory exam is intact  Imaging:    X-rays 2 views right tib-fib: Status post tibial nailing without evidence of complication with  interval sclerosis at the fracture edges concerning for possible nonunion I personally reviewed and interpreted the radiographs.   Assessment:   27 y.o. male who is status post right tibial intramedullary nailing with concern for possibility of going on to nonunion.  I described that persistent fracture site pain and marginal sclerosis at the fracture site may be evidence of him going on to nonunion.  To that effect I do believe that this in conjunction with his high nonunion rate is due to smoking are indicative of the need for a bone stimulator.  He is continuing to have pain at the fracture site.  Will plan to proceed with a bone stimulator and I will see him back for placement Plan :    -Return to clinic following months to rollator for placement     I personally saw and evaluated the patient, and participated in the management and treatment plan.  Vanetta Mulders, MD Attending Physician, Orthopedic Surgery  This document was dictated using Dragon voice recognition software. A reasonable attempt at proof reading has been made to minimize errors.

## 2023-01-03 ENCOUNTER — Encounter (HOSPITAL_BASED_OUTPATIENT_CLINIC_OR_DEPARTMENT_OTHER): Payer: Medicaid Other | Admitting: Orthopaedic Surgery

## 2023-01-08 ENCOUNTER — Telehealth: Payer: Self-pay | Admitting: Orthopaedic Surgery

## 2023-01-08 NOTE — Telephone Encounter (Signed)
Patient called. Would like an appointment with Dr. Sammuel Hines this week. He missed his appointment last week. His call back number is [phone]

## 2023-01-10 ENCOUNTER — Ambulatory Visit (HOSPITAL_BASED_OUTPATIENT_CLINIC_OR_DEPARTMENT_OTHER): Payer: Medicaid Other | Admitting: Orthopaedic Surgery

## 2023-01-19 ENCOUNTER — Ambulatory Visit (HOSPITAL_BASED_OUTPATIENT_CLINIC_OR_DEPARTMENT_OTHER): Payer: Medicaid Other | Admitting: Orthopaedic Surgery

## 2023-01-19 ENCOUNTER — Other Ambulatory Visit (HOSPITAL_BASED_OUTPATIENT_CLINIC_OR_DEPARTMENT_OTHER): Payer: Self-pay | Admitting: Orthopaedic Surgery

## 2023-01-19 ENCOUNTER — Ambulatory Visit (INDEPENDENT_AMBULATORY_CARE_PROVIDER_SITE_OTHER): Payer: Medicaid Other | Admitting: Orthopaedic Surgery

## 2023-01-19 ENCOUNTER — Ambulatory Visit (INDEPENDENT_AMBULATORY_CARE_PROVIDER_SITE_OTHER): Payer: Medicaid Other

## 2023-01-19 ENCOUNTER — Ambulatory Visit (HOSPITAL_BASED_OUTPATIENT_CLINIC_OR_DEPARTMENT_OTHER): Payer: Self-pay | Admitting: Orthopaedic Surgery

## 2023-01-19 DIAGNOSIS — Z09 Encounter for follow-up examination after completed treatment for conditions other than malignant neoplasm: Secondary | ICD-10-CM

## 2023-01-19 DIAGNOSIS — S82251A Displaced comminuted fracture of shaft of right tibia, initial encounter for closed fracture: Secondary | ICD-10-CM

## 2023-01-19 NOTE — Progress Notes (Signed)
Chief Complaint: Status post right tibial nailing 08/19/22     History of Present Illness:   01/19/2023: John [name] presents today for follow-up status post right tibial nailing done on August 19, 2022.  Unfortunately he is still having persistent pain about the right mid tibia.  This is sore and changes with weather.  He is able to walk without difficulty although there is pain with most activities including basic walking.  At this time he is denying any fevers or chills.  Denies any redness or swelling about the injury.   Surgical History:     PMH/PSH/Family History/Social History/Meds/Allergies:    Past Medical History:  Diagnosis Date   Acne nodule    on going   Aggressive behavior 04/13/2019   Hepatitis C    OD (overdose of drug), intentional self-harm, initial encounter ([place]) 04/13/2019   Substance abuse ([place]) 04/13/2019   Suicide attempt ([place]) 04/13/2019   Past Surgical History:  Procedure Laterality Date   ORIF FINGER / THUMB FRACTURE     TIBIA IM NAIL INSERTION Right 08/19/2022   Procedure: INTRAMEDULLARY (IM) NAIL TIBIAL;  Surgeon: Vanetta Mulders, MD;  Location: [facility];  Service: Orthopedics;  Laterality: Right;   Social History   Socioeconomic History   Marital status: Single    Spouse name: Not on file   Number of children: Not on file   Years of education: Not on file   Highest education level: Not on file  Occupational History   Not on file  Tobacco Use   Smoking status: Every Day    Packs/day: 1.00    Years: 5.00    Total pack years: 5.00    Types: Cigarettes   Smokeless tobacco: Never  Vaping Use   Vaping Use: Never used  Substance and Sexual Activity   Alcohol use: Not Currently    Alcohol/week: 6.0 standard drinks of alcohol    Types: 6 Shots of liquor per week    Comment: history of heavy alcohol use; trying to cut back 09/28/20; no etoh 09/10/20. no etoh 03/23/21.   Drug use: Not Currently    Types: Marijuana,  Oxycodone, Methamphetamines    Comment: heroin; denied 11/24/20, denied 03/23/21   Sexual activity: Yes    Birth control/protection: Condom  Other Topics Concern   Not on file  Social History Narrative   Not on file   Social Determinants of Health   Financial Resource Strain: Not on file  Food Insecurity: Not on file  Transportation Needs: Not on file  Physical Activity: Not on file  Stress: Not on file  Social Connections: Not on file   Family History  Problem Relation Age of Onset   Bipolar disorder Mother    Liver disease Maternal Grandfather        etoh   Colon cancer Neg Hx    Allergies  Allergen Reactions   Benzonatate Other (See Comments)    Chest pain  Chest pain   Current Outpatient Medications  Medication Sig Dispense Refill   acetaminophen (TYLENOL) 500 MG tablet Take 2 tablets (1,000 mg total) by mouth every 6 (six) hours as needed for mild pain. 30 tablet 0   docusate sodium (COLACE) 100 MG capsule Take 1 capsule (100 mg total) by mouth 2 (two) times daily as needed for mild constipation or moderate constipation.  10 capsule 0   oxyCODONE (OXY IR/ROXICODONE) 5 MG immediate release tablet Take 2 tablets (10 mg total) by mouth every 4 (four) hours as needed. 20 tablet 0   polyethylene glycol (MIRALAX / GLYCOLAX) 17 g packet Take 17 g by mouth daily as needed for moderate constipation. 14 each 0   No current facility-administered medications for this visit.   No results found.  Review of Systems:   A ROS was performed including pertinent positives and negatives as documented in the HPI.  Physical Exam :   Constitutional: NAD and appears stated age Neurological: Alert and oriented Psych: Appropriate affect and cooperative There were no vitals taken for this visit.   Comprehensive Musculoskeletal Exam:    Right knee incisions are healed.  Range of motion of the right knee is from 0 to 135 degrees without pain.  Wounds over his right leg are well-appearing.   Tenderness about the fracture site.  Imaging:    X-rays 2 views right tib-fib: Status post tibial nailing without evidence of complication with interval sclerosis at the fracture edges concerning for possible nonunion I personally reviewed and interpreted the radiographs.   Assessment:   27 y.o. 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  Unfortunately I did describe that his x-ray findings today are consistent with a nonunion.  To that effect we did discuss the differential diagnosis.  I would like to send him for an ESR and CRP to rule out infection or an infected nonunion.  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  At this time I do believe that he would benefit from intervention given the fact that his nonunion is painful.  I did specifically recommend an exchange nailing along with bone marrow aspirate from the right iliac crest and bone grafting to encourage healing.  I did attempt to obtain a bone stimulator 2 months prior during the period of possible nonunion although unfortunately he was not able to obtain this.  Given that I do believe that there has been progression to nonunion.  I have offered continued conservative management although he has pain and as result would like intervention at this time. Plan :    -Plan for right tibial exchange nailing and bone marrow aspirate aspiration from right iliac crest     I personally saw and evaluated the patient, and participated in the management and treatment plan.  Vanetta Mulders, MD Attending Physician, Orthopedic Surgery  This document was dictated using Dragon voice recognition software. A reasonable attempt at proof reading has been made to minimize errors.

## 2023-01-20 ENCOUNTER — Other Ambulatory Visit (HOSPITAL_BASED_OUTPATIENT_CLINIC_OR_DEPARTMENT_OTHER): Payer: Self-pay | Admitting: Orthopaedic Surgery

## 2023-01-26 ENCOUNTER — Emergency Department (HOSPITAL_BASED_OUTPATIENT_CLINIC_OR_DEPARTMENT_OTHER)
Admission: EM | Admit: 2023-01-26 | Discharge: 2023-01-26 | Disposition: A | Discharge status: Home / Self Care | Payer: 59 | Attending: Emergency Medicine | Admitting: Emergency Medicine

## 2023-01-26 ENCOUNTER — Encounter (HOSPITAL_BASED_OUTPATIENT_CLINIC_OR_DEPARTMENT_OTHER): Payer: Self-pay

## 2023-01-26 ENCOUNTER — Other Ambulatory Visit: Payer: Self-pay

## 2023-01-26 DIAGNOSIS — T148XXA Other injury of unspecified body region, initial encounter: Secondary | ICD-10-CM

## 2023-01-26 DIAGNOSIS — S0993XA Unspecified injury of face, initial encounter: Secondary | ICD-10-CM | POA: Diagnosis present

## 2023-01-26 DIAGNOSIS — S0081XA Abrasion of other part of head, initial encounter: Secondary | ICD-10-CM | POA: Insufficient documentation

## 2023-01-26 MED ORDER — BACITRACIN ZINC 500 UNIT/GM EX OINT: 1.0000 | TOPICAL_OINTMENT | Freq: Two times a day (BID) | CUTANEOUS | 0 refills | Status: AC

## 2023-01-26 MED ORDER — DOXYCYCLINE HYCLATE 100 MG PO CAPS: 100.0000 mg | ORAL_CAPSULE | Freq: Two times a day (BID) | ORAL | 0 refills | Status: DC
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
## 2023-01-26 NOTE — Discharge Instructions (Signed)
Use bacitracin ointment twice daily for the next 5 to 7 days.  Take antibiotic as prescribed.  Please use gentle soap and water prior to putting on bacitracin ointment.  Dry your face fully.
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
## 2023-02-03 ENCOUNTER — Other Ambulatory Visit: Payer: Self-pay

## 2023-02-03 ENCOUNTER — Emergency Department (HOSPITAL_COMMUNITY)
Admission: EM | Admit: 2023-02-03 | Discharge: 2023-02-03 | Disposition: A | Discharge status: Home / Self Care | Payer: 59 | Attending: Emergency Medicine | Admitting: Emergency Medicine

## 2023-02-03 DIAGNOSIS — F419 Anxiety disorder, unspecified: Secondary | ICD-10-CM | POA: Diagnosis present

## 2023-02-03 DIAGNOSIS — Z5900 Homelessness unspecified: Secondary | ICD-10-CM | POA: Diagnosis not present
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
## 2023-02-03 NOTE — Discharge Instructions (Addendum)
I have given you the information for some shelters in [place] in case you need them.
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
## 2023-02-04 ENCOUNTER — Telehealth (HOSPITAL_BASED_OUTPATIENT_CLINIC_OR_DEPARTMENT_OTHER): Payer: Self-pay | Admitting: Emergency Medicine

## 2023-02-04 MED ORDER — DOXYCYCLINE HYCLATE 100 MG PO CAPS: 100.0000 mg | ORAL_CAPSULE | Freq: Two times a day (BID) | ORAL | 0 refills | Status: DC
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
## 2023-02-05 ENCOUNTER — Other Ambulatory Visit: Payer: Self-pay

## 2023-02-05 ENCOUNTER — Emergency Department (HOSPITAL_BASED_OUTPATIENT_CLINIC_OR_DEPARTMENT_OTHER)
Admission: EM | Admit: 2023-02-05 | Discharge: 2023-02-05 | Disposition: A | Discharge status: Home / Self Care | Payer: 59 | Attending: Emergency Medicine | Admitting: Emergency Medicine

## 2023-02-05 DIAGNOSIS — T148XXA Other injury of unspecified body region, initial encounter: Secondary | ICD-10-CM

## 2023-02-05 DIAGNOSIS — L988 Other specified disorders of the skin and subcutaneous tissue: Secondary | ICD-10-CM | POA: Diagnosis present

## 2023-02-05 MED ORDER — DOXYCYCLINE HYCLATE 100 MG PO CAPS: 100.0000 mg | ORAL_CAPSULE | Freq: Two times a day (BID) | ORAL | 0 refills | Status: AC
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
## 2023-02-05 NOTE — ED Notes (Signed)
Meal/snacks given. Pt will be given a taxi voucher at d/c

## 2023-02-05 NOTE — ED Notes (Signed)
Waiting for taxi to have a driver available.

## 2023-02-05 NOTE — ED Notes (Signed)
Reviewed AVS with patient, patient expressed understanding of directions, denies further questions at this time. Patient to finish meal and then DC with taxi voucher.

## 2023-02-06 ENCOUNTER — Other Ambulatory Visit: Payer: Self-pay

## 2023-02-06 ENCOUNTER — Other Ambulatory Visit (HOSPITAL_COMMUNITY)
Admission: EM | Admit: 2023-02-06 | Discharge: 2023-02-08 | Disposition: A | Discharge status: Home / Self Care | Payer: No Typology Code available for payment source | Attending: Psychiatry | Admitting: Psychiatry

## 2023-02-06 DIAGNOSIS — Z79899 Other long term (current) drug therapy: Secondary | ICD-10-CM | POA: Insufficient documentation

## 2023-02-06 DIAGNOSIS — F319 Bipolar disorder, unspecified: Secondary | ICD-10-CM | POA: Insufficient documentation

## 2023-02-06 DIAGNOSIS — F1994 Other psychoactive substance use, unspecified with psychoactive substance-induced mood disorder: Secondary | ICD-10-CM

## 2023-02-06 DIAGNOSIS — M79604 Pain in right leg: Secondary | ICD-10-CM | POA: Insufficient documentation

## 2023-02-06 DIAGNOSIS — F4310 Post-traumatic stress disorder, unspecified: Secondary | ICD-10-CM | POA: Insufficient documentation

## 2023-02-06 DIAGNOSIS — Z8619 Personal history of other infectious and parasitic diseases: Secondary | ICD-10-CM | POA: Insufficient documentation

## 2023-02-06 DIAGNOSIS — F1510 Other stimulant abuse, uncomplicated: Secondary | ICD-10-CM | POA: Diagnosis not present

## 2023-02-06 DIAGNOSIS — Z5900 Homelessness unspecified: Secondary | ICD-10-CM

## 2023-02-06 DIAGNOSIS — Z87891 Personal history of nicotine dependence: Secondary | ICD-10-CM | POA: Insufficient documentation

## 2023-02-06 DIAGNOSIS — Z1152 Encounter for screening for COVID-19: Secondary | ICD-10-CM | POA: Insufficient documentation

## 2023-02-06 DIAGNOSIS — F1110 Opioid abuse, uncomplicated: Secondary | ICD-10-CM

## 2023-02-06 DIAGNOSIS — F419 Anxiety disorder, unspecified: Secondary | ICD-10-CM | POA: Diagnosis not present

## 2023-02-06 DIAGNOSIS — Z5901 Sheltered homelessness: Secondary | ICD-10-CM | POA: Insufficient documentation

## 2023-02-06 DIAGNOSIS — Z9151 Personal history of suicidal behavior: Secondary | ICD-10-CM | POA: Insufficient documentation

## 2023-02-06 DIAGNOSIS — F411 Generalized anxiety disorder: Secondary | ICD-10-CM | POA: Insufficient documentation

## 2023-02-06 DIAGNOSIS — Z760 Encounter for issue of repeat prescription: Secondary | ICD-10-CM | POA: Insufficient documentation

## 2023-02-06 DIAGNOSIS — I498 Other specified cardiac arrhythmias: Secondary | ICD-10-CM | POA: Insufficient documentation

## 2023-02-06 LAB — POC SARS CORONAVIRUS 2 AG: SARSCOV2ONAVIRUS 2 AG: NEGATIVE

## 2023-02-06 MED ORDER — ACETAMINOPHEN 325 MG PO TABS: 650.0000 mg | ORAL_TABLET | Freq: Four times a day (QID) | ORAL | Status: DC | PRN

## 2023-02-06 MED ORDER — NAPROXEN 500 MG PO TABS: 500.0000 mg | ORAL_TABLET | Freq: Two times a day (BID) | ORAL | Status: DC | PRN

## 2023-02-06 MED ORDER — ONDANSETRON 4 MG PO TBDP: 4.0000 mg | ORAL_TABLET | Freq: Four times a day (QID) | ORAL | Status: DC | PRN

## 2023-02-06 MED ORDER — LOPERAMIDE HCL 2 MG PO CAPS: 2.0000 mg | ORAL_CAPSULE | ORAL | Status: DC | PRN

## 2023-02-06 MED ORDER — ZIPRASIDONE MESYLATE 20 MG IM SOLR: 20.0000 mg | Freq: Two times a day (BID) | INTRAMUSCULAR | Status: DC | PRN

## 2023-02-06 MED ORDER — HYDROXYZINE HCL 25 MG PO TABS: 25.0000 mg | ORAL_TABLET | Freq: Four times a day (QID) | ORAL | Status: DC | PRN

## 2023-02-06 MED ORDER — MAGNESIUM HYDROXIDE 400 MG/5ML PO SUSP: 30.0000 mL | Freq: Every day | ORAL | Status: DC | PRN

## 2023-02-06 MED ORDER — DICYCLOMINE HCL 20 MG PO TABS: 20.0000 mg | ORAL_TABLET | Freq: Four times a day (QID) | ORAL | Status: DC | PRN

## 2023-02-06 MED ORDER — METHOCARBAMOL 500 MG PO TABS: 500.0000 mg | ORAL_TABLET | Freq: Three times a day (TID) | ORAL | Status: DC | PRN

## 2023-02-06 MED ORDER — LORAZEPAM 1 MG PO TABS: 1.0000 mg | ORAL_TABLET | ORAL | Status: DC | PRN

## 2023-02-06 MED ORDER — ALUM & MAG HYDROXIDE-SIMETH 200-200-20 MG/5ML PO SUSP: 30.0000 mL | ORAL | Status: DC | PRN

## 2023-02-06 MED ORDER — PRAZOSIN HCL 2 MG PO CAPS
2.0000 mg | ORAL_CAPSULE | Freq: Every day | ORAL | Status: DC
  Filled 2023-02-06: qty 1

## 2023-02-06 MED ORDER — TRAZODONE HCL 50 MG PO TABS
50.0000 mg | ORAL_TABLET | Freq: Every evening | ORAL | Status: DC | PRN
  Administered 2023-02-06 – 2023-02-07 (×2): 50 mg via ORAL
  Filled 2023-02-06 (×2): qty 1
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
## 2023-02-06 NOTE — ED Triage Notes (Signed)
Pt presents to [facility] voluntarily, unaccompanied at this time with complaint of passive SI (no plan or intent) and anxiety. Pt reports history of Bipolar and just recently relapsed on heroine and ICE about a week ago. Pt reports being linked to psychiatrist Dr. Altamese Buckhorn and has been seeing him for about 2 months now and is prescribed Lithium and klonopin and is compliant at this time. Pt denies HI, AVH.
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
## 2023-02-07 DIAGNOSIS — F411 Generalized anxiety disorder: Secondary | ICD-10-CM | POA: Diagnosis not present

## 2023-02-07 DIAGNOSIS — F1110 Opioid abuse, uncomplicated: Secondary | ICD-10-CM | POA: Diagnosis not present

## 2023-02-07 DIAGNOSIS — F1510 Other stimulant abuse, uncomplicated: Secondary | ICD-10-CM | POA: Diagnosis not present

## 2023-02-07 DIAGNOSIS — F1994 Other psychoactive substance use, unspecified with psychoactive substance-induced mood disorder: Secondary | ICD-10-CM | POA: Diagnosis not present

## 2023-02-07 LAB — COMPREHENSIVE METABOLIC PANEL
ALT: 304 U/L — ABNORMAL HIGH (ref 0–44)
AST: 113 U/L — ABNORMAL HIGH (ref 15–41)
Albumin: 3.7 g/dL (ref 3.5–5.0)
Alkaline Phosphatase: 102 U/L (ref 38–126)
Anion gap: 10 (ref 5–15)
BUN: 15 mg/dL (ref 6–20)
CO2: 22 mmol/L (ref 22–32)
Calcium: 8.9 mg/dL (ref 8.9–10.3)
Chloride: 104 mmol/L (ref 98–111)
Creatinine, Ser: 1 mg/dL (ref 0.61–1.24)
GFR, Estimated: >60 mL/min (ref >60)
Glucose, Bld: 101 mg/dL — ABNORMAL HIGH (ref 70–99)
Potassium: 3.8 mmol/L (ref 3.5–5.1)
Sodium: 136 mmol/L (ref 135–145)
Total Bilirubin: 0.7 mg/dL (ref 0.3–1.2)
Total Protein: 6.8 g/dL (ref 6.5–8.1)

## 2023-02-07 LAB — POCT URINE DRUG SCREEN - MANUAL ENTRY (I-SCREEN)
POC Amphetamine UR: POSITIVE — AB
POC Buprenorphine (BUP): NOT DETECTED
POC Cocaine UR: NOT DETECTED — AB
POC Marijuana UR: POSITIVE — AB
POC Methadone UR: NOT DETECTED
POC Methamphetamine UR: POSITIVE — AB
POC Morphine: POSITIVE — AB
POC Oxazepam (BZO): NOT DETECTED
POC Oxycodone UR: NOT DETECTED
POC Secobarbital (BAR): NOT DETECTED

## 2023-02-07 LAB — CBC WITH DIFFERENTIAL/PLATELET
Abs Immature Granulocytes: 0.01 10*3/uL (ref 0.00–0.07)
Basophils Absolute: 0 10*3/uL (ref 0.0–0.1)
Basophils Relative: 0 %
Eosinophils Absolute: 0.2 10*3/uL (ref 0.0–0.5)
Eosinophils Relative: 3 %
HCT: 42.1 % (ref 39.0–52.0)
Hemoglobin: 14.3 g/dL (ref 13.0–17.0)
Immature Granulocytes: 0 %
Lymphocytes Relative: 46 %
Lymphs Abs: 3.2 10*3/uL (ref 0.7–4.0)
MCH: 29.1 pg (ref 26.0–34.0)
MCHC: 34 g/dL (ref 30.0–36.0)
MCV: 85.6 fL (ref 80.0–100.0)
Monocytes Absolute: 0.6 10*3/uL (ref 0.1–1.0)
Monocytes Relative: 8 %
Neutro Abs: 3 10*3/uL (ref 1.7–7.7)
Neutrophils Relative %: 43 %
Platelets: 278 10*3/uL (ref 150–400)
RBC: 4.92 MIL/uL (ref 4.22–5.81)
RDW: 13.8 % (ref 11.5–15.5)
WBC: 7 10*3/uL (ref 4.0–10.5)
nRBC: 0 % (ref 0.0–0.2)

## 2023-02-07 LAB — LIPID PANEL
Cholesterol: 112 mg/dL (ref 0–200)
HDL: 50 mg/dL (ref >40)
LDL Cholesterol: 53 mg/dL (ref 0–99)
Total CHOL/HDL Ratio: 2.2 RATIO
Triglycerides: 47 mg/dL (ref <150)
VLDL: 9 mg/dL (ref 0–40)

## 2023-02-07 LAB — LITHIUM LEVEL: Lithium Lvl: <0.06 mmol/L — ABNORMAL LOW (ref 0.60–1.20)

## 2023-02-07 LAB — SARS CORONAVIRUS 2 BY RT PCR: SARS Coronavirus 2 by RT PCR: NEGATIVE

## 2023-02-07 LAB — TSH: TSH: 0.18 u[IU]/mL — ABNORMAL LOW (ref 0.350–4.500)

## 2023-02-07 MED ORDER — LITHIUM CARBONATE ER 300 MG PO TBCR
600.0000 mg | EXTENDED_RELEASE_TABLET | Freq: Every day | ORAL | Status: DC
  Filled 2023-02-07: qty 2

## 2023-02-07 MED ORDER — LITHIUM CARBONATE ER 300 MG PO TBCR: 600.0000 mg | EXTENDED_RELEASE_TABLET | Freq: Every day | ORAL | Status: DC

## 2023-02-07 MED ORDER — LITHIUM CARBONATE ER 450 MG PO TBCR
900.0000 mg | EXTENDED_RELEASE_TABLET | Freq: Every day | ORAL | Status: DC
  Administered 2023-02-07: 900 mg via ORAL
  Filled 2023-02-07: qty 2

## 2023-02-07 NOTE — ED Notes (Addendum)
Patient agitated this evening wanting klonopin. Provider notified and talked to the patient about it. Patient declined agitation medications but took his night medications and went to his room. Will continue to monitor and maintain safety

## 2023-02-07 NOTE — ED Notes (Signed)
Pt A&O x 4, presents with passive SI, no plan noted. Requesting Klonopin.  Denies HI or AVH.  Pt reports he relapsed on Heroin & ICE.  Comfort measures given.  Monitoring for safety.

## 2023-02-07 NOTE — ED Notes (Addendum)
Pt sleeping in no acute distress. RR even and unlabored. Environment secured. Will continue to monitor for safety. 

## 2023-02-07 NOTE — ED Notes (Signed)
Pt was given cereal, milk, and juice for breakfast.

## 2023-02-07 NOTE — Progress Notes (Signed)
Pt is asleep. Respirations are even and unlabored. No signs of acute distress noted. Staff will monitor for pt's safety. 

## 2023-02-07 NOTE — Progress Notes (Signed)
Pt transferred to [facility]. 

## 2023-02-07 NOTE — ED Notes (Signed)
Pt sleeping at present, no distress noted.  Monitoring for safety. 

## 2023-02-07 NOTE — ED Notes (Signed)
While giving night time medications, patient became increasingly agitated and yelling requesting Klonopin.. This nurse advised the patient that night shift was not advised of him being on Klonopin during report. Patient was also advised that he does not have an order for Klonopin. At the time this nurse was giving medications to other patients and advised patient I will get to his request once I am finished with the current patient I was with. Patient became more upset stating he has been here for 2 days and has been asking for this medication for 2 days. I advised patient there are 10 patients on the unit and we have to see each patient and that I will get to him as soon as I could.

## 2023-02-07 NOTE — Progress Notes (Signed)
Pt is asleep. Respirations are even and unlabored. Pt did not voice any complaints of pain or discomfort. No signs of acute distress noted. Pt denies current SI/HI/AVH, plan or intent. Staff will monitor for pt's safety.

## 2023-02-07 NOTE — ED Notes (Signed)
Patient was admitted to [facility] from [place]. Patient reports he would like inpatient rehab treatment. Patient denies SI/HI and AVH. Patient is calm and cooperative and is looking forward to treatment.

## 2023-02-07 NOTE — ED Provider Notes (Signed)
Facility Based Crisis Admission H&P  Date: 02/07/23 Patient Name: John [name] MRN: [id number] Chief Complaint: Substance-induced mood disorder  Diagnoses:  Final diagnoses:  Anxiety  Heroin abuse ([place])  Methamphetamine abuse ([place])  Substance induced mood disorder ([place])  Homelessness unspecified    HPI: Patient is reassessed by this nurse practitioner, face-to-face.  He is reclined in observation area upon my approach, appears asleep.  He is easily awakened.  He is alert and oriented, pleasant and cooperative during assessment.  John [name] would like to transition to residential substance use treatment if possible.  He states recent relapse his related to ongoing depressed mood.  He endorses anhedonia, depressed mood, hopelessness and fatigue worsening times several weeks.  Patient endorses substance use including methamphetamine and heroin during the last 2 days.  He denies alcohol use, denies substance use aside from methamphetamine and heroin.  Most recent residential substance use treatment approximately 6 months ago.  He believes it was effective at that time.  Patient denies suicidal and homicidal ideations.  He denies history of suicide attempts, denies history of nonsuicidal self-harm behavior.  He denies auditory and visual hallucinations.  There is no evidence of delusional thought content no indication that patient is responding to internal stimuli.  He denies symptoms of paranoia.  North is seen for outpatient psychiatry follow-up by Dr. Altamese Louisa at [facility].  He is prescribed clonazepam and lithium.  Lithium level subtherapeutic, less than 0.06 on 02/06/2023.  Patient reports he has missed several doses of his medication.  Patient reports he will only take lithium at bedtime.  Has refused morning dose and asks that this does be discontinued.  He is not linked with outpatient counseling currently.  Would like to be.  He endorses history of multiple previous inpatient psychiatric  hospitalizations.  No family mental health history reported.  Patient is mainly homeless in [place], has resided at the shelter.  He is not currently employed.  He reports average sleep and appetite while at [facility] behavioral health.  Patient offered support and encouragement.  He remains voluntary and agrees with plan for admission to facility based crisis unit.   PHQ 2-9:   [place] ED from 02/06/2023 in [facility] ED from 02/05/2023 in [facility] Emergency Department at [facility] ED from 02/03/2023 in [facility] Emergency Department at [place] High Risk No Risk No Risk        Total Time spent with patient: 30 minutes  Musculoskeletal  Strength & Muscle Tone: within normal limits Gait & Station: normal Patient leans: N/A  Psychiatric Specialty Exam  Presentation General Appearance:  Casual  Eye Contact: Good  Speech: Clear and Coherent; Normal Rate  Speech Volume: Normal  Handedness: Right   Mood and Affect  Mood: Depressed  Affect: Depressed   Thought Process  Thought Processes: Coherent; Goal Directed; Linear  Descriptions of Associations:Intact  Orientation:Full (Time, Place and Person)  Thought Content:Logical; WDL  Diagnosis of Schizophrenia or Schizoaffective disorder in past: No   Hallucinations:Hallucinations: None  Ideas of Reference:None  Suicidal Thoughts:Suicidal Thoughts: No  Homicidal Thoughts:Homicidal Thoughts: No   Sensorium  Memory: Immediate Good; Recent Fair  Judgment: Fair  Insight: Fair   Community education officer  Concentration: Good  Attention Span: Good  Recall: Good  Fund of Knowledge: Good  Language: Good   Psychomotor Activity  Psychomotor Activity: Psychomotor Activity: Normal   Assets  Assets: Communication Skills; Desire for Improvement; Resilience; Physical Health  Sleep  Sleep: Sleep:  Fair   Nutritional Assessment (For OBS and FBC admissions only) Has the patient had a weight loss or gain of 10 pounds or more in the last 3 months?: No Has the patient had a decrease in food intake/or appetite?: No Does the patient have dental problems?: No Does the patient have eating habits or behaviors that may be indicators of an eating disorder including binging or inducing vomiting?: No Has the patient recently lost weight without trying?: 0 Has the patient been eating poorly because of a decreased appetite?: 0 Malnutrition Screening Tool Score: 0    Physical Exam Vitals and nursing note reviewed.  Constitutional:      Appearance: Normal appearance. He is well-developed and normal weight.  HENT:     Head: Normocephalic and atraumatic.     Nose: Nose normal.  Cardiovascular:     Rate and Rhythm: Normal rate.  Pulmonary:     Effort: Pulmonary effort is normal.  Musculoskeletal:        General: Normal range of motion.     Cervical back: Normal range of motion.  Skin:    General: Skin is warm and dry.  Neurological:     Mental Status: He is alert and oriented to person, place, and time.  Psychiatric:        Attention and Perception: Attention and perception normal.        Mood and Affect: Affect normal. Mood is depressed.        Speech: Speech normal.        Behavior: Behavior normal. Behavior is cooperative.        Thought Content: Thought content normal.        Cognition and Memory: Cognition and memory normal.    Review of Systems  Constitutional: Negative.   HENT: Negative.    Eyes: Negative.   Respiratory: Negative.    Cardiovascular: Negative.   Gastrointestinal: Negative.   Genitourinary: Negative.   Musculoskeletal: Negative.   Skin: Negative.   Neurological: Negative.   Psychiatric/Behavioral:  Positive for depression and substance abuse.     Blood pressure 104/75, pulse 74, temperature (!) 97.5 F (36.4 C), temperature source Oral, resp. rate 18,  SpO2 100 %. There is no height or weight on file to calculate BMI.  Past Psychiatric History: Suicidal ideation, unspecified mood disorder, substance abuse, generalized anxiety disorder, PTSD  Is the patient at risk to self? No  Has the patient been a risk to self in the past 6 months? No .    Has the patient been a risk to self within the distant past? No   Is the patient a risk to others? No   Has the patient been a risk to others in the past 6 months? No   Has the patient been a risk to others within the distant past? No   Past Medical History: Liver mass, abnormal LFTs, hepatitis C Family History: None reported Social History: Substance use disorder, recent relapse, homelessness  Last Labs:  Admission on 02/06/2023  Component Date Value Ref Range Status   WBC 02/06/2023 7.0  4.0 - 10.5 K/uL Final   RBC 02/06/2023 4.92  4.22 - 5.81 MIL/uL Final   Hemoglobin 02/06/2023 14.3  13.0 - 17.0 g/dL Final   HCT 02/06/2023 42.1  39.0 - 52.0 % Final   MCV 02/06/2023 85.6  80.0 - 100.0 fL Final   MCH 02/06/2023 29.1  26.0 - 34.0 pg Final   MCHC 02/06/2023 34.0  30.0 -  36.0 g/dL Final   RDW 02/06/2023 13.8  11.5 - 15.5 % Final   Platelets 02/06/2023 278  150 - 400 K/uL Final   nRBC 02/06/2023 0.0  0.0 - 0.2 % Final   Neutrophils Relative % 02/06/2023 43  % Final   Neutro Abs 02/06/2023 3.0  1.7 - 7.7 K/uL Final   Lymphocytes Relative 02/06/2023 46  % Final   Lymphs Abs 02/06/2023 3.2  0.7 - 4.0 K/uL Final   Monocytes Relative 02/06/2023 8  % Final   Monocytes Absolute 02/06/2023 0.6  0.1 - 1.0 K/uL Final   Eosinophils Relative 02/06/2023 3  % Final   Eosinophils Absolute 02/06/2023 0.2  0.0 - 0.5 K/uL Final   Basophils Relative 02/06/2023 0  % Final   Basophils Absolute 02/06/2023 0.0  0.0 - 0.1 K/uL Final   Immature Granulocytes 02/06/2023 0  % Final   Abs Immature Granulocytes 02/06/2023 0.01  0.00 - 0.07 K/uL Final   Performed at [facility], [place] [address], [place],  [place] [address]   Sodium 02/06/2023 136  135 - 145 mmol/L Final   Potassium 02/06/2023 3.8  3.5 - 5.1 mmol/L Final   Chloride 02/06/2023 104  98 - 111 mmol/L Final   CO2 02/06/2023 22  22 - 32 mmol/L Final   Glucose, Bld 02/06/2023 101 (H)  70 - 99 mg/dL Final   Glucose reference range applies only to samples taken after fasting for at least 8 hours.   BUN 02/06/2023 15  6 - 20 mg/dL Final   Creatinine, Ser 02/06/2023 1.00  0.61 - 1.24 mg/dL Final   Calcium 02/06/2023 8.9  8.9 - 10.3 mg/dL Final   Total Protein 02/06/2023 6.8  6.5 - 8.1 g/dL Final   Albumin 02/06/2023 3.7  3.5 - 5.0 g/dL Final   AST 02/06/2023 113 (H)  15 - 41 U/L Final   ALT 02/06/2023 304 (H)  0 - 44 U/L Final   Alkaline Phosphatase 02/06/2023 102  38 - 126 U/L Final   Total Bilirubin 02/06/2023 0.7  0.3 - 1.2 mg/dL Final   GFR, Estimated 02/06/2023 >60  >60 mL/min Final   Comment: (NOTE) Calculated using the CKD-EPI Creatinine Equation (2021)    Anion gap 02/06/2023 10  5 - 15 Final   Performed at [facility], [place] [address] [place], [place] [address]   Lithium Lvl 02/06/2023 <0.06 (L)  0.60 - 1.20 mmol/L Final   Performed at [facility] [address], [place], [place] [address]   SARSCOV2ONAVIRUS 2 AG 02/06/2023 NEGATIVE  NEGATIVE Final   Comment: (NOTE) SARS-CoV-2 antigen NOT DETECTED.   Negative results are presumptive.  Negative results do not preclude SARS-CoV-2 infection and should not be used as the sole basis for treatment or other patient management decisions, including infection  control decisions, particularly in the presence of clinical signs and  symptoms consistent with COVID-19, or in those who have been in contact with the virus.  Negative results must be combined with clinical observations, patient history, and epidemiological information. The expected result is Negative.  Fact Sheet for Patients: HandmadeRecipes.com.cy  Fact Sheet for Healthcare  Providers: FuneralLife.at  This test is not yet approved or cleared by the [place] FDA and  has been authorized for detection and/or diagnosis of SARS-CoV-2 by FDA under an Emergency Use Authorization (EUA).  This EUA will remain in effect (meaning this test can be used) for the duration of  the COV  ID-19 declaration under Section 564(b)(1) of the Act, 21 U.S.C. section 360bbb-3(b)(1), unless the authorization is terminated or revoked sooner.     SARS Coronavirus 2 by RT PCR 02/06/2023 NEGATIVE  NEGATIVE Final   Performed at [facility], [place] [address], [place], [place] [address]   Cholesterol 02/06/2023 112  0 - 200 mg/dL Final   Triglycerides 02/06/2023 47  <150 mg/dL Final   HDL 02/06/2023 50  >40 mg/dL Final   Total CHOL/HDL Ratio 02/06/2023 2.2  RATIO Final   VLDL 02/06/2023 9  0 - 40 mg/dL Final   LDL Cholesterol 02/06/2023 53  0 - 99 mg/dL Final   Comment:        Total Cholesterol/HDL:CHD Risk Coronary Heart Disease Risk Table                     Men   Women  1/2 Average Risk   3.4   3.3  Average Risk       5.0   4.4  2 X Average Risk   9.6   7.1  3 X Average Risk  23.4   11.0        Use the calculated Patient Ratio above and the CHD Risk Table to determine the patient's CHD Risk.        ATP III CLASSIFICATION (LDL):  <100     mg/dL   Optimal  100-129  mg/dL   Near or Above                    Optimal  130-159  mg/dL   Borderline  160-189  mg/dL   High  >190     mg/dL   Very High Performed at [facility] [address] [place], [place] [address]    TSH 02/06/2023 0.180 (L)  0.350 - 4.500 uIU/mL Final   Comment: Performed by a 3rd Generation assay with a functional sensitivity of <=0.01 uIU/mL. Performed at [facility], [place] [address] [place], [place] [address]   No results displayed because visit has over 200 results.      Allergies: Benzonatate  Medications:  Facility Ordered  Medications  Medication   acetaminophen (TYLENOL) tablet 650 mg   alum & mag hydroxide-simeth (MAALOX/MYLANTA) 200-200-20 MG/5ML suspension 30 mL   magnesium hydroxide (MILK OF MAGNESIA) suspension 30 mL   dicyclomine (BENTYL) tablet 20 mg   hydrOXYzine (ATARAX) tablet 25 mg   loperamide (IMODIUM) capsule 2-4 mg   methocarbamol (ROBAXIN) tablet 500 mg   ondansetron (ZOFRAN-ODT) disintegrating tablet 4 mg   traZODone (DESYREL) tablet 50 mg   ziprasidone (GEODON) injection 20 mg   And   LORazepam (ATIVAN) tablet 1 mg   prazosin (MINIPRESS) capsule 2 mg   lithium carbonate (ESKALITH) ER tablet 900 mg   PTA Medications  Medication Sig   docusate sodium (COLACE) 100 MG capsule Take 1 capsule (100 mg total) by mouth 2 (two) times daily as needed for mild constipation or moderate constipation.   bacitracin ointment Apply 1 Application topically 2 (two) times daily.   doxycycline (VIBRAMYCIN) 100 MG capsule Take 1 capsule (100 mg total) by mouth 2 (two) times daily.    Long Term Goals: Improvement in symptoms so as ready for discharge  Short Term Goals: Patient will verbalize feelings in meetings with treatment team members., Patient will attend at least of 50% of the groups daily., Pt will complete the PHQ9 on admission, day 3 and discharge.,  Patient will participate in completing the [place], Patient will score a low risk of violence for 24 hours prior to discharge, and Patient will take medications as prescribed daily.  Medical Decision Making  Patient remains voluntary.  He agrees with plan for admission to facility based crisis unit at [facility] health.  He would like to transition to residential substance use treatment.   Labs reviewed Lithium level- less than 0.06 CMP- AST 113, ALT 304 UDS - positive cocaine, marijuana, amphetamine, benzodiazepine, THC Ethanol less than 10 Respiratory Panel-influenza A and B negative, COVID negative     Recommendations  Based on my evaluation the patient does not appear to have an emergency medical condition.  Lucky Rathke, FNP 02/07/23  9:49 AM
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
## 2023-02-07 NOTE — Progress Notes (Signed)
Pt is asleep. Respirations are even and unlabored. No distress noted. Staff will monitor for pt's safety. 

## 2023-02-08 DIAGNOSIS — F1110 Opioid abuse, uncomplicated: Secondary | ICD-10-CM | POA: Diagnosis not present

## 2023-02-08 DIAGNOSIS — F1510 Other stimulant abuse, uncomplicated: Secondary | ICD-10-CM | POA: Diagnosis not present

## 2023-02-08 DIAGNOSIS — F1994 Other psychoactive substance use, unspecified with psychoactive substance-induced mood disorder: Secondary | ICD-10-CM | POA: Diagnosis not present

## 2023-02-08 DIAGNOSIS — F411 Generalized anxiety disorder: Secondary | ICD-10-CM | POA: Diagnosis not present

## 2023-02-08 LAB — HEMOGLOBIN A1C
Hgb A1c MFr Bld: 5.5 % (ref 4.8–5.6)
Mean Plasma Glucose: 111 mg/dL

## 2023-02-08 MED ORDER — LITHIUM CARBONATE ER 450 MG PO TBCR: 900.0000 mg | EXTENDED_RELEASE_TABLET | Freq: Every day | ORAL | 0 refills | Status: AC

## 2023-02-08 NOTE — ED Notes (Signed)
Patient resting quietly in bed with eyes closed, Respirations equal and unlabored, skin warm and dry, NAD. Routine safety checks conducted according to facility protocol. Will continue to monitor for safety 

## 2023-02-08 NOTE — ED Notes (Signed)
Patient was eating breakfast this morning with the other patients on the unit. Patient denies SI/HI and AVH. Patient is being monitored for safety.

## 2023-02-08 NOTE — ED Notes (Signed)
Pt sleeping in no acute distress. RR even and unlabored. Environment secured. Will continue to monitor for safety. 
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
## 2023-02-08 NOTE — ED Provider Notes (Signed)
Received the following report  "demanding to see provider. He said that he is not receiving his klonopin that is prescribed to him. He is rude and obnoxious and yelling over top of the nurses".  Patient seen face to face in [facility] and provider explained he is currently not prescribed Klonopin for anxiety, and has another antianxiety medication to help with his withdrawal symptoms.  Patient loud, posturing and rude in front of staff and other patient while demanding for klonopin "because I need it for withdrawals", and demanding provider "check again" in his chart because " you obviously didn't check well the first time".      Patient informed he has appropriate medications in his chart to help with his symptoms and treatment. Patient angry, loud and argumentative. Security present. Situation de-escalated without further issues.

## 2023-02-08 NOTE — ED Notes (Signed)
Pt demanding to discharge due to facility not giving him Klonpin. Provider attempted to speak with pt but pt agitated demanding to discharge "Now". Pt at nurses station arguing with staff. Staff, along with Security, escorted pt to locker room to retrieve his belongings and then, to lobby. Safety maintained.

## 2023-02-08 NOTE — Discharge Instructions (Signed)
Patient is instructed prior to discharge to:  Take all medications as prescribed by his/her mental healthcare provider. Report any adverse effects and or reactions from the medicines to his/her outpatient provider promptly. Keep all scheduled appointments, to ensure that you are getting refills on time and to avoid any interruption in your medication.  If you are unable to keep an appointment call to reschedule.  Be sure to follow-up with resources and follow-up appointments provided.  Patient has been instructed & cautioned: To not engage in alcohol and or illegal drug use while on prescription medicines. In the event of worsening symptoms, patient is instructed to call the crisis hotline, 911 and or go to the nearest ED for appropriate evaluation and treatment of symptoms. To follow-up with his/her primary care provider for your other medical issues, concerns and or health care needs.  Information: -National Suicide Prevention Lifeline 1-800-SUICIDE or 226-538-7606.  -988 offers 24/7 access to trained crisis counselors who can help people experiencing mental health-related distress. People can call or text 988 or chat 988lifeline.org for themselves or if they are worried about a loved one who may need crisis support.      Substance Abuse Treatment Resources listed Below:  [facility] - Admissions are currently completed Monday through Friday at [facility]; both appointments and walk-ins are accepted.  Any individual that is a [facility] resident may present for a substance abuse screening and assessment for admission.  A person may be referred by numerous sources or self-refer.   Potential clients will be screened for medical necessity and appropriateness for the program.  Clients must meet criteria for high-intensity residential treatment services.  If clinically appropriate, a client will continue with the comprehensive clinical assessment and intake process, as well  as enrollment in the [place].  Address: [address] [place], [place] [address] Admin Hours: [place] Hours: 24/7 Phone: [phone] Fax: [place] Address: [place], [place], [place] [address] Behavioral Health Urgent Care [facility]) Hours: 24/7 Phone: [phone] Fax: [phone]  [facility] ([facility]): (offers outpatient therapy and intensive outpatient substance abuse therapy).  [address] [place], [place] [address] Phone: [phone]  [place]: Offers FREE recovery skills classes, support groups, 1:1 Peer Support, and Compeer Classes. [address], [place], [place] [address] Phone: [phone] (Call to complete intake).   [facility] [place], [place] [address] Phone: [phone] ext [phone] The [facility] provides food, shelter and other programs and services to the homeless men of [place] through our [employer] program.  By offering safe shelter, three meals a day, clean clothing, Biblical counseling, financial planning, vocational training, GED/education and employment assistance, we've helped mend the shattered lives of many homeless men since opening in 1974.  We have approximately 267 beds available, with a max of 312 beds including mats for emergency situations and currently house an average of 270 men a night.  Prospective Client Check-In Information Photo ID Required (State/ Out of State/ [facility]) - if photo ID is not available, clients are required to have a printout of a police/sheriff's criminal history report. Help out with chores around the [place]. No sex offender of any type (pending, charged, registered and/or any other sex related offenses) will be permitted to check in. Must be willing to abide by all rules, regulations, and policies established by the [employer]. The  following  will be provided - shelter, food, clothing, and biblical counseling. If you or someone you know is in need of assistance at our [facility] shelter in [place], [place], please call [phone] ext. [phone]:2075573.  [facility]-will provide timely access to mental health services for children and adolescents (4-17) and adults presenting in a mental health crisis. The program is designed for those who need urgent Behavioral Health or Substance Use treatment and are not experiencing a medical crisis that would typically require an emergency room visit.    [place], [place] [address] Phone: [phone] [facility]: Phone#: [phone]  The [facility] SA Intensive Outpatient Program (SAIOP) means structured individual and group addiction activities and services that are provided at an outpatient program designed to assist adult and adolescent consumers to begin recovery and learn skills for recovery maintenance. The [place] program is offered at least 3 hours a day, 3 days a week.SAIOP services shall include a structured program consisting of, but not limited to, the following services: Individual counseling and support; Group counseling and support; Family counseling, training or support; Biochemical assays to identify recent drug use (e.g., urine drug screens); Strategies for relapse prevention to include community and social support systems in treatment; Life skills; Crisis contingency planning; Disease Management; and Treatment support activities that have been adapted or specifically designed for persons with physical disabilities, or persons with co-occurring disorders of mental illness and substance abuse/dependence or mental retardation/developmental disability and substance abuse/dependence. Phone: [phone]  Address:   The [facility] will also offer the following outpatient  services: (Monday through Friday 8am-5pm)   Partial Hospitalization Program (PHP) Substance Abuse Intensive Outpatient Program (SA-IOP) Group Therapy Medication Management Peer Living Room We also provide (24/7):  Assessments: Our mental health clinician and providers will conduct a focused mental health evaluation, assessing for immediate safety concerns and further mental health needs. Referral: Our team will provide resources and help connect to community based mental health treatment, when indicated, including psychotherapy, psychiatry, and other specialized behavioral health or substance use disorder services (for those not already in treatment). Transitional Care: Our team providers in person bridging and/or telephonic follow-up during the patient's transition to outpatient services.  [facility] 24-Hour Call Center: [phone] Behavioral Health Crisis Line: [phone]   Homeless Shelter List:     [place])  [place], [place]  Phone: [phone]     [facility]  [address] [place], [place] [address]  Phone: [phone]     [facility] (Women only)  [address] [place], [place] [address]  Phone: [address]. [place], [place] [address]  Phone: [phone]     [place]:  [phone]. [address]  [place], [place] [address]  Phone: [phone]     [facility]  [address], [place],  [address]  (Check in at 6:00PM for placement at a local shelter)  Phone: [phone]

## 2023-02-26 ENCOUNTER — Telehealth: Payer: Self-pay | Admitting: Orthopaedic Surgery

## 2023-02-26 NOTE — Telephone Encounter (Signed)
Patient called and advised he has become homeless and that he will be contacting back when he gets back on his feet to schedule surgery.

## 2023-02-27 NOTE — Telephone Encounter (Signed)
Informed [employer]

## 2023-04-16 ENCOUNTER — Telehealth (HOSPITAL_BASED_OUTPATIENT_CLINIC_OR_DEPARTMENT_OTHER): Payer: Self-pay | Admitting: Orthopaedic Surgery

## 2023-04-16 ENCOUNTER — Other Ambulatory Visit (HOSPITAL_BASED_OUTPATIENT_CLINIC_OR_DEPARTMENT_OTHER): Payer: Self-pay | Admitting: Orthopaedic Surgery

## 2023-04-16 DIAGNOSIS — S82251A Displaced comminuted fracture of shaft of right tibia, initial encounter for closed fracture: Secondary | ICD-10-CM

## 2023-04-16 NOTE — Telephone Encounter (Signed)
Patient has relocated and has a new phone number [phone]. Patient would like a referral sent to [facility]  (phone) [phone]  patient has not been to this location. I advised that I am not sure if Dr B will send a referel to a clinic that the patient has never been to

## 2023-04-16 NOTE — Telephone Encounter (Signed)
Referral placed to chart  

## 2024-07-17 ENCOUNTER — Ambulatory Visit (HOSPITAL_BASED_OUTPATIENT_CLINIC_OR_DEPARTMENT_OTHER): Payer: Self-pay

## 2024-07-17 ENCOUNTER — Encounter (HOSPITAL_BASED_OUTPATIENT_CLINIC_OR_DEPARTMENT_OTHER): Payer: Self-pay | Admitting: Student

## 2024-07-17 ENCOUNTER — Ambulatory Visit (HOSPITAL_BASED_OUTPATIENT_CLINIC_OR_DEPARTMENT_OTHER): Payer: Self-pay | Admitting: Student

## 2024-07-17 DIAGNOSIS — M25521 Pain in right elbow: Secondary | ICD-10-CM

## 2024-07-18 ENCOUNTER — Encounter (HOSPITAL_BASED_OUTPATIENT_CLINIC_OR_DEPARTMENT_OTHER): Payer: Self-pay | Admitting: Student
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
## 2024-07-22 ENCOUNTER — Inpatient Hospital Stay: Admission: RE | Admit: 2024-07-22 | Payer: Self-pay | Source: Ambulatory Visit

## 2024-08-01 ENCOUNTER — Other Ambulatory Visit: Payer: Self-pay

## 2024-08-12 ENCOUNTER — Other Ambulatory Visit: Payer: Self-pay

## 2024-08-15 ENCOUNTER — Inpatient Hospital Stay: Admission: RE | Admit: 2024-08-15 | Payer: Self-pay | Source: Ambulatory Visit

## 2024-09-15 ENCOUNTER — Encounter: Payer: Self-pay | Admitting: Radiology
